# Patient Record
Sex: Male | Born: 1935 | Race: White | Hispanic: No | Marital: Married | Smoking: Former smoker
Health system: Southern US, Community
[De-identification: ages and names within clinical notes are randomized; demographics above are authoritative.]

## PROBLEM LIST (undated history)

## (undated) DIAGNOSIS — I779 Disorder of arteries and arterioles, unspecified: Secondary | ICD-10-CM

## (undated) DIAGNOSIS — R9430 Abnormal result of cardiovascular function study, unspecified: Secondary | ICD-10-CM

## (undated) DIAGNOSIS — E785 Hyperlipidemia, unspecified: Secondary | ICD-10-CM

## (undated) DIAGNOSIS — I2510 Atherosclerotic heart disease of native coronary artery without angina pectoris: Secondary | ICD-10-CM

## (undated) DIAGNOSIS — N200 Calculus of kidney: Secondary | ICD-10-CM

## (undated) DIAGNOSIS — IMO0002 Reserved for concepts with insufficient information to code with codable children: Secondary | ICD-10-CM

## (undated) DIAGNOSIS — K589 Irritable bowel syndrome without diarrhea: Secondary | ICD-10-CM

## (undated) DIAGNOSIS — R252 Cramp and spasm: Secondary | ICD-10-CM

## (undated) DIAGNOSIS — I472 Ventricular tachycardia: Secondary | ICD-10-CM

## (undated) DIAGNOSIS — R42 Dizziness and giddiness: Secondary | ICD-10-CM

## (undated) DIAGNOSIS — K7689 Other specified diseases of liver: Secondary | ICD-10-CM

## (undated) DIAGNOSIS — R059 Cough, unspecified: Secondary | ICD-10-CM

## (undated) DIAGNOSIS — Z951 Presence of aortocoronary bypass graft: Secondary | ICD-10-CM

## (undated) DIAGNOSIS — R000 Tachycardia, unspecified: Secondary | ICD-10-CM

## (undated) DIAGNOSIS — R040 Epistaxis: Secondary | ICD-10-CM

## (undated) DIAGNOSIS — J449 Chronic obstructive pulmonary disease, unspecified: Secondary | ICD-10-CM

## (undated) DIAGNOSIS — K219 Gastro-esophageal reflux disease without esophagitis: Secondary | ICD-10-CM

## (undated) DIAGNOSIS — I519 Heart disease, unspecified: Secondary | ICD-10-CM

## (undated) DIAGNOSIS — F329 Major depressive disorder, single episode, unspecified: Secondary | ICD-10-CM

## (undated) DIAGNOSIS — N400 Enlarged prostate without lower urinary tract symptoms: Secondary | ICD-10-CM

## (undated) DIAGNOSIS — I739 Peripheral vascular disease, unspecified: Secondary | ICD-10-CM

## (undated) DIAGNOSIS — I447 Left bundle-branch block, unspecified: Secondary | ICD-10-CM

## (undated) DIAGNOSIS — Z789 Other specified health status: Secondary | ICD-10-CM

## (undated) DIAGNOSIS — R0981 Nasal congestion: Secondary | ICD-10-CM

## (undated) DIAGNOSIS — F32A Depression, unspecified: Secondary | ICD-10-CM

## (undated) DIAGNOSIS — K5790 Diverticulosis of intestine, part unspecified, without perforation or abscess without bleeding: Secondary | ICD-10-CM

## (undated) DIAGNOSIS — Z953 Presence of xenogenic heart valve: Secondary | ICD-10-CM

## (undated) DIAGNOSIS — I255 Ischemic cardiomyopathy: Secondary | ICD-10-CM

## (undated) DIAGNOSIS — R05 Cough: Secondary | ICD-10-CM

## (undated) HISTORY — DX: Irritable bowel syndrome, unspecified: K58.9

## (undated) HISTORY — DX: Major depressive disorder, single episode, unspecified: F32.9

## (undated) HISTORY — DX: Atherosclerotic heart disease of native coronary artery without angina pectoris: I25.10

## (undated) HISTORY — DX: Cough, unspecified: R05.9

## (undated) HISTORY — PX: CORONARY ARTERY BYPASS GRAFT: SHX141

## (undated) HISTORY — DX: Dizziness and giddiness: R42

## (undated) HISTORY — DX: Other specified health status: Z78.9

## (undated) HISTORY — DX: Chronic obstructive pulmonary disease, unspecified: J44.9

## (undated) HISTORY — DX: Benign prostatic hyperplasia without lower urinary tract symptoms: N40.0

## (undated) HISTORY — DX: Depression, unspecified: F32.A

## (undated) HISTORY — DX: Ventricular tachycardia: I47.2

## (undated) HISTORY — PX: CHOLECYSTECTOMY: SHX55

## (undated) HISTORY — DX: Tachycardia, unspecified: R00.0

## (undated) HISTORY — PX: LITHOTRIPSY: SUR834

## (undated) HISTORY — DX: Gastro-esophageal reflux disease without esophagitis: K21.9

## (undated) HISTORY — PX: INSERT / REPLACE / REMOVE PACEMAKER: SUR710

## (undated) HISTORY — DX: Presence of aortocoronary bypass graft: Z95.1

## (undated) HISTORY — DX: Reserved for concepts with insufficient information to code with codable children: IMO0002

## (undated) HISTORY — DX: Other specified diseases of liver: K76.89

## (undated) HISTORY — DX: Calculus of kidney: N20.0

## (undated) HISTORY — DX: Left bundle-branch block, unspecified: I44.7

## (undated) HISTORY — DX: Cramp and spasm: R25.2

## (undated) HISTORY — DX: Abnormal result of cardiovascular function study, unspecified: R94.30

## (undated) HISTORY — DX: Presence of xenogenic heart valve: Z95.3

## (undated) HISTORY — DX: Ischemic cardiomyopathy: I25.5

## (undated) HISTORY — DX: Peripheral vascular disease, unspecified: I73.9

## (undated) HISTORY — DX: Diverticulosis of intestine, part unspecified, without perforation or abscess without bleeding: K57.90

## (undated) HISTORY — DX: Epistaxis: R04.0

## (undated) HISTORY — DX: Hyperlipidemia, unspecified: E78.5

## (undated) HISTORY — DX: Heart disease, unspecified: I51.9

## (undated) HISTORY — PX: OTHER SURGICAL HISTORY: SHX169

## (undated) HISTORY — DX: Cough: R05

## (undated) HISTORY — DX: Nasal congestion: R09.81

## (undated) HISTORY — DX: Disorder of arteries and arterioles, unspecified: I77.9

---

## 1998-10-01 ENCOUNTER — Encounter (INDEPENDENT_AMBULATORY_CARE_PROVIDER_SITE_OTHER): Payer: Self-pay | Admitting: Specialist

## 1998-10-01 ENCOUNTER — Other Ambulatory Visit: Admission: RE | Admit: 1998-10-01 | Discharge: 1998-10-01 | Payer: Self-pay | Admitting: Gastroenterology

## 1999-01-14 ENCOUNTER — Encounter: Payer: Self-pay | Admitting: Gastroenterology

## 1999-01-14 ENCOUNTER — Ambulatory Visit (HOSPITAL_COMMUNITY): Admission: RE | Admit: 1999-01-14 | Discharge: 1999-01-14 | Payer: Self-pay | Admitting: Gastroenterology

## 1999-04-30 ENCOUNTER — Encounter: Admission: RE | Admit: 1999-04-30 | Discharge: 1999-04-30 | Payer: Self-pay | Admitting: *Deleted

## 1999-04-30 ENCOUNTER — Encounter: Payer: Self-pay | Admitting: *Deleted

## 1999-05-21 ENCOUNTER — Encounter: Payer: Self-pay | Admitting: *Deleted

## 1999-05-21 ENCOUNTER — Encounter: Admission: RE | Admit: 1999-05-21 | Discharge: 1999-05-21 | Payer: Self-pay | Admitting: Pediatrics

## 1999-05-27 ENCOUNTER — Ambulatory Visit (HOSPITAL_COMMUNITY): Admission: RE | Admit: 1999-05-27 | Discharge: 1999-05-27 | Payer: Self-pay | Admitting: Gastroenterology

## 1999-05-27 ENCOUNTER — Encounter: Payer: Self-pay | Admitting: Gastroenterology

## 1999-05-29 ENCOUNTER — Encounter: Admission: RE | Admit: 1999-05-29 | Discharge: 1999-05-29 | Payer: Self-pay | Admitting: *Deleted

## 1999-06-04 ENCOUNTER — Encounter: Payer: Self-pay | Admitting: *Deleted

## 1999-06-04 ENCOUNTER — Encounter: Admission: RE | Admit: 1999-06-04 | Discharge: 1999-06-04 | Payer: Self-pay | Admitting: *Deleted

## 2003-12-16 ENCOUNTER — Ambulatory Visit: Payer: Self-pay | Admitting: Cardiology

## 2003-12-25 ENCOUNTER — Ambulatory Visit: Payer: Self-pay

## 2004-01-13 ENCOUNTER — Ambulatory Visit: Payer: Self-pay | Admitting: Cardiology

## 2004-02-12 ENCOUNTER — Ambulatory Visit: Payer: Self-pay | Admitting: Cardiology

## 2004-09-01 ENCOUNTER — Ambulatory Visit: Payer: Self-pay | Admitting: Cardiology

## 2004-12-03 ENCOUNTER — Ambulatory Visit: Payer: Self-pay | Admitting: Gastroenterology

## 2004-12-29 ENCOUNTER — Ambulatory Visit: Payer: Self-pay | Admitting: Cardiology

## 2005-01-12 ENCOUNTER — Ambulatory Visit: Payer: Self-pay

## 2005-01-12 ENCOUNTER — Encounter: Payer: Self-pay | Admitting: Cardiology

## 2005-01-12 ENCOUNTER — Encounter: Payer: Self-pay | Admitting: Cardiovascular Disease

## 2005-01-25 ENCOUNTER — Ambulatory Visit: Payer: Self-pay | Admitting: Cardiology

## 2005-01-28 ENCOUNTER — Ambulatory Visit: Payer: Self-pay | Admitting: Cardiology

## 2005-01-29 ENCOUNTER — Ambulatory Visit: Payer: Self-pay | Admitting: Cardiology

## 2005-02-03 ENCOUNTER — Inpatient Hospital Stay (HOSPITAL_BASED_OUTPATIENT_CLINIC_OR_DEPARTMENT_OTHER): Admission: RE | Admit: 2005-02-03 | Discharge: 2005-02-03 | Payer: Self-pay | Admitting: Cardiology

## 2005-02-03 ENCOUNTER — Ambulatory Visit: Payer: Self-pay | Admitting: Cardiology

## 2005-02-08 DIAGNOSIS — Z951 Presence of aortocoronary bypass graft: Secondary | ICD-10-CM

## 2005-02-08 HISTORY — PX: AORTIC VALVE REPLACEMENT: SHX41

## 2005-02-08 HISTORY — DX: Presence of aortocoronary bypass graft: Z95.1

## 2005-02-09 ENCOUNTER — Ambulatory Visit: Payer: Self-pay

## 2005-02-13 ENCOUNTER — Ambulatory Visit: Payer: Self-pay | Admitting: Cardiology

## 2005-03-02 ENCOUNTER — Ambulatory Visit: Payer: Self-pay | Admitting: Internal Medicine

## 2005-03-02 ENCOUNTER — Encounter (INDEPENDENT_AMBULATORY_CARE_PROVIDER_SITE_OTHER): Payer: Self-pay | Admitting: Specialist

## 2005-03-02 ENCOUNTER — Inpatient Hospital Stay (HOSPITAL_COMMUNITY)
Admission: RE | Admit: 2005-03-02 | Discharge: 2005-03-12 | Payer: Self-pay | Admitting: Thoracic Surgery (Cardiothoracic Vascular Surgery)

## 2005-03-02 ENCOUNTER — Encounter: Payer: Self-pay | Admitting: Cardiology

## 2005-03-08 ENCOUNTER — Encounter: Payer: Self-pay | Admitting: Cardiology

## 2005-03-16 ENCOUNTER — Ambulatory Visit: Payer: Self-pay | Admitting: Cardiology

## 2005-05-10 ENCOUNTER — Ambulatory Visit: Payer: Self-pay | Admitting: Cardiology

## 2005-05-21 ENCOUNTER — Ambulatory Visit: Payer: Self-pay | Admitting: Cardiology

## 2005-06-22 ENCOUNTER — Ambulatory Visit: Payer: Self-pay | Admitting: Internal Medicine

## 2005-09-09 ENCOUNTER — Ambulatory Visit: Payer: Self-pay | Admitting: Cardiology

## 2005-12-02 ENCOUNTER — Ambulatory Visit: Payer: Self-pay | Admitting: Gastroenterology

## 2005-12-21 ENCOUNTER — Ambulatory Visit: Payer: Self-pay | Admitting: Cardiology

## 2005-12-21 LAB — CONVERTED CEMR LAB
Chol/HDL Ratio, serum: 3.9
Cholesterol: 124 mg/dL
HDL: 31.4 mg/dL — ABNORMAL LOW
LDL Cholesterol: 82 mg/dL
Triglyceride fasting, serum: 51 mg/dL
VLDL: 10 mg/dL

## 2006-01-12 ENCOUNTER — Ambulatory Visit: Payer: Self-pay

## 2006-02-21 ENCOUNTER — Ambulatory Visit: Payer: Self-pay

## 2006-03-28 ENCOUNTER — Ambulatory Visit: Payer: Self-pay | Admitting: Internal Medicine

## 2006-05-23 ENCOUNTER — Ambulatory Visit: Payer: Self-pay | Admitting: Internal Medicine

## 2006-06-20 ENCOUNTER — Ambulatory Visit: Payer: Self-pay | Admitting: Cardiology

## 2006-06-20 LAB — CONVERTED CEMR LAB
Albumin: 3.7 g/dL (ref 3.5–5.2)
Bilirubin, Direct: 0.1 mg/dL (ref 0.0–0.3)
Cholesterol: 128 mg/dL (ref 0–200)
HDL: 28.3 mg/dL — ABNORMAL LOW (ref >39.0)
Total Bilirubin: 0.7 mg/dL (ref 0.3–1.2)
Total CHOL/HDL Ratio: 4.5
Total Protein: 6.6 g/dL (ref 6.0–8.3)
Triglycerides: 72 mg/dL (ref 0–149)

## 2006-07-18 ENCOUNTER — Ambulatory Visit: Payer: Self-pay | Admitting: Internal Medicine

## 2006-08-08 ENCOUNTER — Ambulatory Visit: Payer: Self-pay | Admitting: Cardiology

## 2006-08-08 LAB — CONVERTED CEMR LAB
ALT: 19 units/L (ref 0–53)
AST: 23 units/L (ref 0–37)
Cholesterol: 140 mg/dL (ref 0–200)
HDL: 32.9 mg/dL — ABNORMAL LOW (ref >39.0)
Total Bilirubin: 0.8 mg/dL (ref 0.3–1.2)
Total Protein: 6.8 g/dL (ref 6.0–8.3)

## 2006-09-12 ENCOUNTER — Ambulatory Visit: Payer: Self-pay | Admitting: Internal Medicine

## 2006-11-07 ENCOUNTER — Ambulatory Visit: Payer: Self-pay | Admitting: Internal Medicine

## 2007-01-02 ENCOUNTER — Ambulatory Visit: Payer: Self-pay | Admitting: Internal Medicine

## 2007-01-02 ENCOUNTER — Ambulatory Visit: Payer: Self-pay | Admitting: Cardiology

## 2007-01-02 LAB — CONVERTED CEMR LAB
AST: 23 units/L (ref 0–37)
Cholesterol: 119 mg/dL (ref 0–200)
HDL: 29.3 mg/dL — ABNORMAL LOW (ref >39.0)
Total Bilirubin: 0.6 mg/dL (ref 0.3–1.2)
Total CHOL/HDL Ratio: 4.1
Total Protein: 6.7 g/dL (ref 6.0–8.3)

## 2007-01-18 ENCOUNTER — Ambulatory Visit: Payer: Self-pay | Admitting: Cardiology

## 2007-01-27 ENCOUNTER — Ambulatory Visit: Payer: Self-pay | Admitting: Cardiology

## 2007-02-27 ENCOUNTER — Ambulatory Visit: Payer: Self-pay | Admitting: Internal Medicine

## 2007-03-23 ENCOUNTER — Ambulatory Visit: Payer: Self-pay

## 2007-03-30 ENCOUNTER — Ambulatory Visit: Payer: Self-pay | Admitting: Gastroenterology

## 2007-05-29 ENCOUNTER — Ambulatory Visit: Payer: Self-pay | Admitting: Internal Medicine

## 2007-08-28 ENCOUNTER — Ambulatory Visit: Payer: Self-pay | Admitting: Internal Medicine

## 2007-10-03 ENCOUNTER — Ambulatory Visit: Payer: Self-pay | Admitting: Internal Medicine

## 2007-11-27 ENCOUNTER — Ambulatory Visit: Payer: Self-pay | Admitting: Internal Medicine

## 2008-01-12 ENCOUNTER — Ambulatory Visit: Payer: Self-pay | Admitting: Cardiology

## 2008-01-12 ENCOUNTER — Ambulatory Visit: Payer: Self-pay

## 2008-03-18 ENCOUNTER — Telehealth: Payer: Self-pay | Admitting: Gastroenterology

## 2008-03-19 ENCOUNTER — Encounter: Payer: Self-pay | Admitting: Gastroenterology

## 2008-04-19 ENCOUNTER — Encounter: Payer: Self-pay | Admitting: Internal Medicine

## 2008-04-25 ENCOUNTER — Ambulatory Visit: Payer: Self-pay

## 2008-07-09 ENCOUNTER — Ambulatory Visit: Payer: Self-pay | Admitting: Internal Medicine

## 2008-08-26 ENCOUNTER — Telehealth: Payer: Self-pay | Admitting: Gastroenterology

## 2008-08-26 ENCOUNTER — Telehealth: Payer: Self-pay | Admitting: Cardiology

## 2008-09-16 ENCOUNTER — Encounter: Payer: Self-pay | Admitting: Gastroenterology

## 2008-10-31 ENCOUNTER — Ambulatory Visit: Payer: Self-pay | Admitting: Internal Medicine

## 2008-11-01 DIAGNOSIS — I10 Essential (primary) hypertension: Secondary | ICD-10-CM

## 2008-11-27 ENCOUNTER — Encounter (INDEPENDENT_AMBULATORY_CARE_PROVIDER_SITE_OTHER): Payer: Self-pay | Admitting: *Deleted

## 2009-01-14 ENCOUNTER — Encounter: Payer: Self-pay | Admitting: Cardiology

## 2009-01-15 ENCOUNTER — Encounter: Payer: Self-pay | Admitting: Cardiology

## 2009-01-15 ENCOUNTER — Ambulatory Visit: Payer: Self-pay

## 2009-01-19 ENCOUNTER — Encounter: Payer: Self-pay | Admitting: Cardiology

## 2009-01-19 DIAGNOSIS — Z951 Presence of aortocoronary bypass graft: Secondary | ICD-10-CM | POA: Insufficient documentation

## 2009-01-19 DIAGNOSIS — Z954 Presence of other heart-valve replacement: Secondary | ICD-10-CM

## 2009-01-19 DIAGNOSIS — Z950 Presence of cardiac pacemaker: Secondary | ICD-10-CM | POA: Insufficient documentation

## 2009-01-19 DIAGNOSIS — J449 Chronic obstructive pulmonary disease, unspecified: Secondary | ICD-10-CM

## 2009-01-19 DIAGNOSIS — K219 Gastro-esophageal reflux disease without esophagitis: Secondary | ICD-10-CM

## 2009-01-19 DIAGNOSIS — J4489 Other specified chronic obstructive pulmonary disease: Secondary | ICD-10-CM | POA: Insufficient documentation

## 2009-02-10 ENCOUNTER — Ambulatory Visit: Payer: Self-pay | Admitting: Internal Medicine

## 2009-02-14 ENCOUNTER — Telehealth: Payer: Self-pay | Admitting: Cardiology

## 2009-03-06 ENCOUNTER — Telehealth: Payer: Self-pay | Admitting: Gastroenterology

## 2009-04-30 ENCOUNTER — Encounter: Payer: Self-pay | Admitting: Cardiology

## 2009-05-02 ENCOUNTER — Ambulatory Visit: Payer: Self-pay | Admitting: Cardiology

## 2009-05-22 ENCOUNTER — Ambulatory Visit: Payer: Self-pay | Admitting: Cardiology

## 2009-05-22 ENCOUNTER — Ambulatory Visit: Payer: Self-pay

## 2009-05-22 ENCOUNTER — Ambulatory Visit (HOSPITAL_COMMUNITY): Admission: RE | Admit: 2009-05-22 | Discharge: 2009-05-22 | Payer: Self-pay | Admitting: Cardiology

## 2009-05-22 ENCOUNTER — Encounter: Payer: Self-pay | Admitting: Cardiology

## 2009-06-02 ENCOUNTER — Telehealth: Payer: Self-pay | Admitting: Gastroenterology

## 2009-06-05 ENCOUNTER — Ambulatory Visit: Payer: Self-pay | Admitting: Gastroenterology

## 2009-06-05 DIAGNOSIS — K5730 Diverticulosis of large intestine without perforation or abscess without bleeding: Secondary | ICD-10-CM | POA: Insufficient documentation

## 2009-06-05 LAB — CONVERTED CEMR LAB
Folate: >20 ng/mL
Iron: 47 ug/dL (ref 42–165)
Vitamin B-12: 441 pg/mL (ref 211–911)

## 2009-06-11 ENCOUNTER — Telehealth (INDEPENDENT_AMBULATORY_CARE_PROVIDER_SITE_OTHER): Payer: Self-pay | Admitting: *Deleted

## 2009-08-08 ENCOUNTER — Ambulatory Visit: Payer: Self-pay | Admitting: Internal Medicine

## 2009-08-12 ENCOUNTER — Telehealth: Payer: Self-pay | Admitting: Cardiology

## 2009-10-17 ENCOUNTER — Encounter: Payer: Self-pay | Admitting: Cardiology

## 2009-10-20 ENCOUNTER — Ambulatory Visit: Payer: Self-pay | Admitting: Cardiology

## 2009-10-21 ENCOUNTER — Telehealth: Payer: Self-pay | Admitting: Cardiology

## 2009-11-04 ENCOUNTER — Encounter: Payer: Self-pay | Admitting: Cardiology

## 2009-11-07 ENCOUNTER — Ambulatory Visit: Payer: Self-pay | Admitting: Internal Medicine

## 2009-11-13 ENCOUNTER — Ambulatory Visit: Payer: Self-pay | Admitting: Cardiology

## 2009-11-17 ENCOUNTER — Emergency Department (HOSPITAL_COMMUNITY): Admission: EM | Admit: 2009-11-17 | Discharge: 2009-11-17 | Payer: Self-pay | Admitting: Emergency Medicine

## 2009-12-04 ENCOUNTER — Ambulatory Visit: Payer: Self-pay | Admitting: Cardiology

## 2009-12-04 ENCOUNTER — Encounter: Payer: Self-pay | Admitting: Internal Medicine

## 2009-12-04 ENCOUNTER — Encounter: Payer: Self-pay | Admitting: Cardiology

## 2009-12-04 DIAGNOSIS — R58 Hemorrhage, not elsewhere classified: Secondary | ICD-10-CM | POA: Insufficient documentation

## 2009-12-05 LAB — CONVERTED CEMR LAB
Basophils Relative: 0.7 % (ref 0.0–3.0)
Eosinophils Relative: 1.4 % (ref 0.0–5.0)
HCT: 31.7 % — ABNORMAL LOW (ref 39.0–52.0)
Hemoglobin: 10.7 g/dL — ABNORMAL LOW (ref 13.0–17.0)
Lymphocytes Relative: 19.9 % (ref 12.0–46.0)
MCV: 88.7 fL (ref 78.0–100.0)
Monocytes Absolute: 0.8 10*3/uL (ref 0.1–1.0)
Neutrophils Relative %: 69.5 % (ref 43.0–77.0)
Platelets: 247 10*3/uL (ref 150.0–400.0)
RDW: 13 % (ref 11.5–14.6)

## 2010-01-15 ENCOUNTER — Ambulatory Visit: Payer: Self-pay | Admitting: Cardiology

## 2010-01-19 ENCOUNTER — Encounter: Payer: Self-pay | Admitting: Cardiology

## 2010-01-19 ENCOUNTER — Ambulatory Visit: Payer: Self-pay

## 2010-01-26 ENCOUNTER — Telehealth: Payer: Self-pay | Admitting: Cardiology

## 2010-02-19 ENCOUNTER — Encounter: Payer: Self-pay | Admitting: Internal Medicine

## 2010-02-24 ENCOUNTER — Ambulatory Visit
Admission: RE | Admit: 2010-02-24 | Discharge: 2010-02-24 | Payer: Self-pay | Source: Home / Self Care | Attending: Cardiology | Admitting: Cardiology

## 2010-03-03 ENCOUNTER — Ambulatory Visit: Payer: Self-pay | Admitting: Internal Medicine

## 2010-03-12 NOTE — Miscellaneous (Signed)
  Clinical Lists Changes  Observations: Added new observation of PAST MED HX:  Hyperlipidemia, on medication. NIASPAN intolerant Cholecystectomy by history. 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  /   good valve function.... mild AI.... echo... April, 2011 CABG....Marland KitchenZOX,0960 CAD EF  45% range  /   EF 25-30%.... echo.... April, 2011..... akinesis inferior posterior walls depression  Chronic obstructive pulmonary disease. renal stones. Prostate difficulties. Carotid artery disease....doppler  01/15/2009..stable...40-59% RICA, 0-39% LICA  dizziness historically. Gastroesophageal reflux disease. diverticulosis and irritable bowel. leg cramping that is not vascular in origin and is stable. liver cyst. permanent pacemaker.  wide complex tachycardia...post op...on a beta blockerand stable.  abdominal ultrasound... 2007.... no significant abdominal aortic aneurysm (10/17/2009 15:35) Added new observation of REFERRING MD: n/a (10/17/2009 15:35) Added new observation of PRIMARY MD: Lanell Persons, MD (10/17/2009 15:35)       Past History:  Past Medical History:  Hyperlipidemia, on medication. NIASPAN intolerant Cholecystectomy by history. 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  /   good valve function.... mild AI.... echo... April, 2011 CABG....Marland KitchenAVW,0981 CAD EF  45% range  /   EF 25-30%.... echo.... April, 2011..... akinesis inferior posterior walls depression  Chronic obstructive pulmonary disease. renal stones. Prostate difficulties. Carotid artery disease....doppler  01/15/2009..stable...40-59% RICA, 0-39% LICA  dizziness historically. Gastroesophageal reflux disease. diverticulosis and irritable bowel. leg cramping that is not vascular in origin and is stable. liver cyst. permanent pacemaker.  wide complex tachycardia...post op...on a beta blockerand stable.  abdominal ultrasound... 2007.... no significant  abdominal aortic aneurysm

## 2010-03-12 NOTE — Progress Notes (Signed)
Summary: refill request  Phone Note Refill Request Message from:  [facility]  Refills Requested: Medication #1:  RAMIPRIL 10 MG CAPS Take one capsule by mouth daily. needs refill today-already out   Method Requested: Telephone to Pharmacy Initial call taken by: Glynda Jaeger,  January 26, 2010 8:55 AM    Prescriptions: RAMIPRIL 10 MG CAPS (RAMIPRIL) Take one capsule by mouth daily  #30 x 10   Entered by:   Hardin Negus, RMA   Authorized by:   Talitha Givens, MD, [facility]   Signed by:   Hardin Negus, RMA on 01/27/2010   Method used:   [facility]        [facility] [phone][facility] (retail)       [address]       [place], [place]  [address]       Ph: [phone]       Fax: [phone]   RxID:   [id number]

## 2010-03-12 NOTE — Progress Notes (Signed)
Summary: refill**Mail to pt, Please call pt once sent**  Phone Note Refill Request Call back at Home Phone [phone] Message from:  Patient on August 12, 2009 4:59 PM  Refills Requested: Medication #1:  SIMVASTATIN 20 MG TABS Take one tablet by mouth daily at bedtime Written Rx mail to pt   Method Requested: Mail to Patient Initial call taken by: Migdalia Dk,  August 12, 2009 4:59 PM  Follow-up for Phone Call        spoke with pt -- ok to send to [facility] Follow-up by: Hardin Negus, RMA,  August 13, 2009 3:55 PM    Prescriptions: SIMVASTATIN 20 MG TABS (SIMVASTATIN) Take one tablet by mouth daily at bedtime  #90 x 3   Entered by:   Hardin Negus, RMA   Authorized by:   Talitha Givens, MD, [facility]   Signed by:   Hardin Negus, RMA on 08/13/2009   Method used:   Faxed to ...       [facility] (mail-order)             , [place]         Ph: [phone]       Fax: [phone]   RxID:   [id number]

## 2010-03-12 NOTE — Assessment & Plan Note (Signed)
Summary: per check out/sf   Visit Type:  Follow-up Primary Asberry [place]:  Lanell Persons, MD  CC:  ischemic cardiomyopathy.  History of Present Illness: The patient is seen for followup of ischemic cardiomyopathy.  He is doing well.  I've been titrating his meds.  His heart rate remained 60 and his carvedilol dose will be kept the same.  He is tolerating Altace and the dose can be increased from 5-10.  Current Medications (verified): 1)  Aciphex 20 Mg  Tbec (Rabeprazole Sodium) .... Take 1 Each Day 30 Minutes Before Meals 2)  Simvastatin 20 Mg Tabs (Simvastatin) .... Take One Tablet By Mouth Daily At Bedtime 3)  Carvedilol 6.25 Mg Tabs (Carvedilol) .... Take One Tablet By Mouth Twice A Day 4)  Furosemide 40 Mg Tabs (Furosemide) .... Take One Tablet By Mouth Daily. 5)  Aspirin 81 Mg Tbec (Aspirin) .... Take One Tablet By Mouth Daily 6)  Finasteride 5 Mg Tabs (Finasteride) .... Take 1 Tablet By Mouth Once A Day 7)  Doxazosin Mesylate 4 Mg Tabs (Doxazosin Mesylate) .... Take 1 Tablet By Mouth Once A Day 8)  Potassium Chloride Crys Cr 20 Meq Cr-Tabs (Potassium Chloride Crys Cr) .... Take One Tablet By Mouth Daily 9)  Benefiber  Powd (Wheat Dextrin) .... Uses Daily 10)  Dulcolax Stool Softener 100 Mg Caps (Docusate Sodium) .... Uses Two Times A Day 11)  Multivitamins   Tabs (Multiple Vitamin) .... Once Daily 12)  Zinc 50 Mg Tabs (Zinc) .... Once Daily 13)  Altace 5 Mg Caps (Ramipril) .... Take One Tablet By Mouth Daily  Allergies: 1)  ! Sulfa  Past History:  Past Medical History:  Hyperlipidemia, on medication. NIASPAN intolerant Cholecystectomy by history. 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  /   good valve function.... mild AI.... echo... April, 2011 CABG....Marland KitchenZOX,0960 CAD EF  45% range  /   EF 25-30%.... echo.... April, 2011..... akinesis inferior posterior walls depression  Chronic obstructive pulmonary disease. renal stones. Prostate  difficulties... Carotid artery disease....doppler  01/15/2009..stable...40-59% RICA, 0-39% LICA  dizziness historically. Gastroesophageal reflux disease. diverticulosis and irritable bowel. leg cramping that is not vascular in origin and is stable. liver cyst. permanent pacemaker.  wide complex tachycardia...post op...on a beta blockerand stable.  abdominal ultrasound... 2007.... no significant abdominal aortic aneurysm Sinus congestion    September, 2011 Nosebleeds   September, 2011...cauterized  Dr.wolicki... September, 2011 /  cauterization repeated October, 2011  Review of Systems       Patient denies fever, chills, headache, sweats, rash, change in vision, change in hearing, chest pain, cough, nausea vomiting, urinary symptoms.  All of the systems are reviewed and are negative.  Vital Signs:  Patient profile:   77 year old male Height:      68 inches Weight:      153 pounds Pulse rate:   60 / minute Pulse rhythm:   regular BP sitting:   132 / 72  (left arm)  Physical Exam  General:  patient is stable. Eyes:  no xanthelasma. Neck:  njugular venous distention. Lungs:  lungs are clear.  Respiratory effort is nonlabored. Heart:  cardiac exam reveals S1 and S2.  There is a systolic murmur. Abdomen:  abdomen is soft. Extremities:  no peripheral edema. Psych:  patient is oriented to person time and place.  Affect is normal.   PPM Specifications Following MD:  Lewayne Bunting, MD     PPM Vendor:  [employer]     PPM Model Number:  [phone]  PPM Serial Number:  [id number] DOI:  03/08/2005      Lead 1    Location: RA     DOI: 03/08/2005     Model #: 1788TC     Serial #: [id number]     Status: active Lead 2    Location: RV     DOI: 03/08/2005     Model #: 1788TC     Serial #: [id number]     Status: active  Magnet Response Rate:  BOL 98.6 ERI 86.3  Indications:  CHB  Explantation Comments:  Pacemaker dependent TTM's with [employer]  PPM Follow Up Pacer Dependent:  Yes       Episodes Coumadin:  No  Parameters Mode:  DDDR     Lower Rate Limit:  60     Upper Rate Limit:  120 Paced AV Delay:  180     Sensed AV Delay:  180 Rate Response Parameters:  Threshold-Auto (+0), Slope-8, Reaction time-fast, Recovery time-medium  Impression & Recommendations:  Problem # 1:  * EF 30%... APRIL, 2011 Adjustment to his meds will be to increase Altase from 5-10 mg.  Problem # 2:  CAROTID ARTERY DISEASE (ICD-433.10)  His updated medication list for this problem includes:    Aspirin 81 Mg Tbec (Aspirin) .[name]... Take one tablet by mouth daily  Orders: Carotid Duplex (Carotid Duplex) We will order a followup carotid Doppler as his is now one year.  His updated medication list for this problem includes:    Aspirin 81 Mg Tbec (Aspirin) .[name]... Take one tablet by mouth daily  Problem # 3:  AORTIC VALVE REPLACEMENT, HX OF (ICD-V43.3) Aortic valve is working well.  No change in therapy.  Plan followup 3-4 weeks  Patient Instructions: 1)  Your physician recommends that you schedule a follow-up appointment in: 3-4 weeks. 2)  Your physician has requested that you have a carotid duplex. This test is an ultrasound of the carotid arteries in your neck. It looks at blood flow through these arteries that supply the brain with blood. Allow one hour for this exam. There are no restrictions or special instructions. 3)  Your physician has recommended you make the following change in your medication: INCREASE RAMIRPIL to 10mg  by mouth daily.

## 2010-03-12 NOTE — Cardiovascular Report (Signed)
Summary: Set designer Check   Imported By: Roderic Ovens 07/09/2009 11:28:29  _____________________________________________________________________  External Attachment:    Type:   Image     Comment:   External Document

## 2010-03-12 NOTE — Cardiovascular Report (Signed)
Summary: TTM   TTM   Imported By: Roderic Ovens 08/29/2009 09:20:29  _____________________________________________________________________  External Attachment:    Type:   Image     Comment:   External Document

## 2010-03-12 NOTE — Progress Notes (Signed)
Summary: question about refills  Phone Note Call from Patient Call back at Home Phone [phone]   Caller: Patient Summary of Call: Pt calling regarding a refill  Initial call taken by: Judie Grieve,  October 21, 2009 4:15 PM  Follow-up for Phone Call        have attempted to call pt x 2 w/busy signal Meredith Staggers, RN  October 21, 2009 5:03 PM Tried calling Pt, let ring more then 10 times with no answer Hardin Negus, RMA  October 22, 2009 10:36 AM   Pt was in the process of switching to [facility] and [facility] and he didn't realize it till after he paid for them, he would like to switch [facility] told him they would contact us for the script. Hardin Negus, RMA  October 22, 2009 4:06 PM

## 2010-03-12 NOTE — Progress Notes (Signed)
Summary: refill meds  Phone Note Refill Request Call back at Home Phone [phone] Message from:  Patient on February 14, 2009 3:54 PM  Refills Requested: Medication #1:  FUROSEMIDE 40 MG TABS Take one tablet by mouth daily.   Supply Requested: 3 months  Medication #2:  ATENOLOL 25 MG TABS Take one tablet by mouth daily   Supply Requested: 3 months [facility]    Method Requested: Mail to Patient Initial call taken by: Lorne Skeens,  February 14, 2009 3:54 PM  Follow-up for Phone Call        refills sent Atenolol 25 mg once daily and Furosemide 40 mg once daily 90 x3 to [facility] Follow-up by: Oswald Hillock,  February 14, 2009 4:08 PM    Prescriptions: FUROSEMIDE 40 MG TABS (FUROSEMIDE) Take one tablet by mouth daily.  #90 x 3   Entered by:   Oswald Hillock   Authorized by:   Talitha Givens, MD, [facility]   Signed by:   Oswald Hillock on 02/14/2009   Method used:   Faxed to ...       [facility]* (mail-order)             ,          Ph: [phone]       Fax: [phone]   RxID:   [id number] ATENOLOL 25 MG TABS (ATENOLOL) Take one tablet by mouth daily  #90 x 3   Entered by:   Oswald Hillock   Authorized by:   Talitha Givens, MD, [facility]   Signed by:   Oswald Hillock on 02/14/2009   Method used:   Faxed to ...       [facility]* (mail-order)             ,          Ph: [phone]       Fax: [phone]   RxID:   [id number]

## 2010-03-12 NOTE — Assessment & Plan Note (Signed)
Summary: ROV PT HAD CAROTID 01-19-10/ WANTED TO WAIT UNTIL JANUARY TO ...   Visit Type:  Follow-up Primary Provider:  Lanell Persons, MD  CC:  cardiomyopathy.  History of Present Illness: The patient is seen for followup of cardiomyopathy.  We have been slowly titrating his meds and he looks quite good.  I saw him last January 15 2010.  ALT ACE dose was increased at that time 10 mg.  He is tolerating this well.  Current Medications (verified): 1)  Aciphex 20 Mg  Tbec (Rabeprazole Sodium) .... Take 1 Each Day 30 Minutes Before Meals 2)  Simvastatin 20 Mg Tabs (Simvastatin) .... Take One Tablet By Mouth Daily At Bedtime 3)  Carvedilol 6.25 Mg Tabs (Carvedilol) .... Take One Tablet By Mouth Twice A Day 4)  Furosemide 40 Mg Tabs (Furosemide) .... Take One Tablet By Mouth Daily. 5)  Aspirin 81 Mg Tbec (Aspirin) .... Take One Tablet By Mouth Daily 6)  Finasteride 5 Mg Tabs (Finasteride) .... Take 1 Tablet By Mouth Once A Day 7)  Doxazosin Mesylate 4 Mg Tabs (Doxazosin Mesylate) .... Take 1 Tablet By Mouth Once A Day 8)  Potassium Chloride Crys Cr 20 Meq Cr-Tabs (Potassium Chloride Crys Cr) .... Take One Tablet By Mouth Daily 9)  Benefiber  Powd (Wheat Dextrin) .... Uses Daily 10)  Dulcolax Stool Softener 100 Mg Caps (Docusate Sodium) .... Uses Two Times A Day 11)  Multivitamins   Tabs (Multiple Vitamin) .... Once Daily 12)  Zinc 50 Mg Tabs (Zinc) .... Once Daily 13)  Ramipril 10 Mg Caps (Ramipril) .... Take One Capsule By Mouth Daily  Allergies (verified): 1)  ! Sulfa  Past History:  Past Medical History:  Hyperlipidemia, on medication. NIASPAN intolerant Cholecystectomy by history. 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  /   good valve function.... mild AI.... echo... April, 2011 CABG....Marland KitchenWUJ,8119 CAD EF  45% range  /   EF 25-30%.... echo.... April, 2011..... akinesis inferior posterior walls depression  Chronic obstructive pulmonary disease. renal  stones. Prostate difficulties... Carotid artery disease....doppler  01/15/2009..stable...40-59% RICA, 0-39% LICA  dizziness historically. Gastroesophageal reflux disease. diverticulosis and irritable bowel. leg cramping that is not vascular in origin and is stable. liver cyst. permanent pacemaker.  wide complex tachycardia...post op...on a beta blockerand stable.  abdominal ultrasound... 2007.... no significant abdominal aortic aneurysm Sinus congestion    September, 2011 Nosebleeds   September, 2011...cauterized  Dr.wolicki... September, 2011 /  cauterization repeated October, 2011.[name]  Review of Systems       Patient denies fever, chills, headache, sweats, rash, change in vision, change in hearing, chest pain, cough, nausea vomiting, urinary symptoms.  All of the systems are reviewed and are negative.  Vital Signs:  Patient profile:   77 year old male Height:      68 inches Weight:      155 pounds BMI:     23.65 Pulse rate:   70 / minute BP sitting:   126 / 44  (left arm) Cuff size:   regular  Vitals Entered By: Hardin Negus, RMA (February 24, 2010 11:09 AM)  Physical Exam  General:  patient looks very good today. Head:  it is atraumatic. Neck:  soft carotid bruit. Lungs:  lungs are clear.  Respiratory effort is unlabored. Heart:  cardiac exam reveals a swollen S2.  There is an outflow murmur from his aortic valve prosthesis. Abdomen:  abdomen is soft. Extremities:  no peripheral edema. Psych:  patient is oriented to person time and place.  Affect is normal.   PPM Specifications Following MD:  Lewayne Bunting, MD     PPM Vendor:  [employer]     PPM Model Number:  [id number]     PPM Serial Number:  [id number] DOI:  03/08/2005      Lead 1    Location: RA     DOI: 03/08/2005     Model #: 1788TC     Serial #: [id number]     Status: active Lead 2    Location: RV     DOI: 03/08/2005     Model #: 1788TC     Serial #: [id number]     Status: active  Magnet Response Rate:  BOL 98.6 ERI  86.3  Indications:  CHB  Explantation Comments:  Pacemaker dependent TTM's with [employer]  PPM Follow Up Pacer Dependent:  Yes      Episodes Coumadin:  No  Parameters Mode:  DDDR     Lower Rate Limit:  60     Upper Rate Limit:  120 Paced AV Delay:  180     Sensed AV Delay:  180 Rate Response Parameters:  Threshold-Auto (+0), Slope-8, Reaction time-fast, Recovery time-medium  Impression & Recommendations:  Problem # 1:  * EF 30%... APRIL, 2011 We will continue to adjust his cardiac meds.  Today his carvedilol will be increased to 6.2 5 in the morning and 12 .5 in the evening for one week followed by 12.5 mg b.i.d.  I will then see him back in 3-4 weeks.  Problem # 2:  CAROTID ARTERY DISEASE (ICD-433.10)  His updated medication list for this problem includes:    Aspirin 81 Mg Tbec (Aspirin) .[name]... Take one tablet by mouth daily Carotid Doppler was done December, 2011.  Disease is stable with 40-59% bilateral disease.  Problem # 3:  CAD (ICD-414.00)  His updated medication list for this problem includes:    Carvedilol 12.5 Mg Tabs (Carvedilol) .[name]... Take one tablet by mouth twice a day    Aspirin 81 Mg Tbec (Aspirin) .[name]... Take one tablet by mouth daily    Ramipril 10 Mg Caps (Ramipril) .[name]... Take one capsule by mouth daily Artery disease is stable.  No change in therapy.  Patient Instructions: 1)  Increase Carvedilol to 12.5mg  two times a day, do this slowly by only increasing night dose for a week then increase both doses to 12.5mg  2)  Follow up in 3-4 weeks Prescriptions: CARVEDILOL 12.5 MG TABS (CARVEDILOL) Take one tablet by mouth twice a day  #60 x 6   Entered by:   Meredith Staggers, RN   Authorized by:   Talitha Givens, MD, [facility]   Signed by:   Meredith Staggers, RN on 02/24/2010   Method used:   [facility]        [facility] [phone][facility] (retail)       [address]       [place], [place]  [address]       Ph: [phone]       Fax:  [phone]   RxID:   [id number]

## 2010-03-12 NOTE — Cardiovascular Report (Signed)
Summary: TTM   TTM   Imported By: Roderic Ovens 02/25/2009 14:35:20  _____________________________________________________________________  External Attachment:    Type:   Image     Comment:   External Document

## 2010-03-12 NOTE — Progress Notes (Signed)
Summary: speak to nurse  Phone Note Call from Patient Call back at Home Phone [phone]   Caller: Patient Call For: Jarold Motto Reason for Call: Talk to Nurse Summary of Call: Patient wouls like to speak directly to nurse. Please xall between 12:00pm-2:00pm. Initial call taken by: Tawni Levy,  June 02, 2009 9:03 AM  Follow-up for Phone Call        Aciphex supply is down to 30 days and will need authorization.Lupita Leash told him to call her because it was too early when she tried before. Would like Lupita Leash to call him when it is done. Follow-up by: Teryl Lucy RN,  June 02, 2009 12:22 PM  Additional Follow-up for Phone Call Additional follow up Details #1::        talked with pt's wife.  Pt has appt with Dr. Jarold Motto 06/05/09.  Will discuss rx at that time. Additional Follow-up by: Ashok Cordia RN,  June 04, 2009 9:51 AM

## 2010-03-12 NOTE — Assessment & Plan Note (Signed)
Summary: ROV   Visit Type:  Follow-up Primary Provider:  Lanell Persons, MD  CC:  CAD / aortic valve replacement.  History of Present Illness: The patient is seen in followup artery disease and aortic valve replacement and pacemaker function.  He is doing well.  He has a history of intermittent dizziness.  He had some mild dizziness lately but nothing that is more marked in the past.  He is not having any chest pain.  He remains active and was working in his garden.  Current Medications (verified): 1)  Aciphex 20 Mg  Tbec (Rabeprazole Sodium) .... Take 1 Each Day 30 Minutes Before Meals 2)  Simvastatin 20 Mg Tabs (Simvastatin) .... Take One Tablet By Mouth Daily At Bedtime 3)  Atenolol 25 Mg Tabs (Atenolol) .... Take One Tablet By Mouth Daily 4)  Furosemide 40 Mg Tabs (Furosemide) .... Take One Tablet By Mouth Daily. 5)  Aspirin 81 Mg Tbec (Aspirin) .... Take One Tablet By Mouth Daily 6)  Finasteride 5 Mg Tabs (Finasteride) .... Take 1 Tablet By Mouth Once A Day 7)  Doxazosin Mesylate 4 Mg Tabs (Doxazosin Mesylate) .... Take 1 Tablet By Mouth Once A Day 8)  Potassium Chloride Crys Cr 20 Meq Cr-Tabs (Potassium Chloride Crys Cr) .... Take One Tablet By Mouth Daily 9)  Doxycycline Hyclate 100 Mg Caps (Doxycycline Hyclate) .... Once Daily  Allergies (verified): 1)  ! Sulfa  Past History:  Past Medical History: Last updated: 04/30/2009  Hyperlipidemia, on medication. NIASPAN intolerant Cholecystectomy by history. LBBB old aortic valve replacement...Marland KitchenMarland KitchenMarland Kitchen tissue valve........January,2007. CABG....Marland KitchenZOX,0960 CAD EF  45% range depression  Chronic obstructive pulmonary disease. renal stones. Prostate difficulties. Carotid artery disease....doppler  01/15/2009..stable...40-59% RICA, 0-39% LICA  dizziness historically. Gastroesophageal reflux disease. diverticulosis and irritable bowel. leg cramping that is not vascular in origin and is stable. liver cyst. permanent pacemaker.    wide complex tachycardia...post op...on a beta blockerand stable.   Review of Systems       Patient denies fever, chills, headache, rash, sweats, change in vision, change in hearing, chest pain, cough, nausea or vomiting, urinary symptoms.  All other systems are reviewed and are negative.  Vital Signs:  Patient profile:   77 year old male Height:      68 inches Weight:      154 pounds Pulse rate:   60 / minute BP sitting:   128 / 60  (left arm) Cuff size:   regular  Vitals Entered By: Hardin Negus, RMA (May 02, 2009 10:08 AM)  Physical Exam  General:  Bill Cunningham looks great. Head:  head is atraumatic. Eyes:  no xanthelasma. Neck:  no jugular venous distention. Chest Wall:  no chest wall tenderness. Lungs:  lungs are clear.  Respiratory effort is nonlabored. Heart:  cardiac exam reveals S1 and S2.  There is a systolic murmur. Abdomen:  abdomen is soft. Msk:  patient has kyphosis of the spine. Extremities:  no peripheral edema. Skin:  no skin rashes. Psych:  patient is oriented to person time and place.  Affect is normal.   PPM Specifications Following MD:  Lewayne Bunting, MD     PPM Vendor:  [employer]     PPM Model Number:  [id number]     PPM Serial Number:  [id number] DOI:  03/08/2005      Lead 1    Location: RA     DOI: 03/08/2005     Model #: 1788TC     Serial #: [id number]  Status: active Lead 2    Location: RV     DOI: 03/08/2005     Model #: 1788TC     Serial #: [id number]     Status: active  Magnet Response Rate:  BOL 98.6 ERI 86.3  Indications:  CHB  Explantation Comments:  Pacemaker dependent TTM's with [employer]  PPM Follow Up Pacer Dependent:  Yes      Parameters Mode:  DDDR     Lower Rate Limit:  60     Upper Rate Limit:  120 Paced AV Delay:  180     Sensed AV Delay:  180 Rate Response Parameters:  Threshold-Auto (+0), Slope-8, Reaction time-fast, Recovery time-medium  Impression & Recommendations:  Problem # 1:  PACEMAKER, PERMANENT (ICD-V45.01) Pacemaker  is fully checked today.  I have reviewed the data with the pacemaker representative.  There was a slight adjustment to his atrial sensing.  Overall there is good function.  Problem # 2:  CAROTID ARTERY DISEASE (ICD-433.10)  His updated medication list for this problem includes:    Aspirin 81 Mg Tbec (Aspirin) .[name]... Take one tablet by mouth daily The patient had followup carotid Dopplers December, 2010.  He has stable disease. No further workup is needed.  Problem # 3:  COPD (ICD-496) The patient has had significant COPD for many years.  This is stable however.  No further workup needed.  Problem # 4:  AORTIC VALVE REPLACEMENT, HX OF (ICD-V43.3)  The patient underwent aortic valve replacement with a tissue valve in 2007.  It is now time for followup 2-D echo.  This will be scheduled.  There is a systolic murmur.  Orders: Echocardiogram (Echo)  Problem # 5:  CAD (ICD-414.00)  His updated medication list for this problem includes:    Atenolol 25 Mg Tabs (Atenolol) .[name]... Take one tablet by mouth daily    Aspirin 81 Mg Tbec (Aspirin) .[name]... Take one tablet by mouth daily Coronary disease is stable.  No further workup is needed at this time.  I will arrange for the 2-D echo.  I will be reviewing his old chart for the question of whether or not he needs abdominal ultrasound for his abdominal aorta.  Patient Instructions: 1)  Your physician recommends that you schedule a follow-up appointment in: i year 2)  Your physician has requested that you have an echocardiogram.  Echocardiography is a painless test that uses sound waves to create images of your heart. It provides your doctor with information about the size and shape of your heart and how well your heart's chambers and valves are working.  This procedure takes approximately one hour. There are no restrictions for this procedure.

## 2010-03-12 NOTE — Miscellaneous (Signed)
  Clinical Lists Changes  Observations: Added new observation of PAST MED HX:  Hyperlipidemia, on medication. NIASPAN intolerant Cholecystectomy by history. LBBB old aortic valve replacement...Marland KitchenMarland KitchenMarland Kitchen tissue valve........January,2007. CABG....Marland KitchenGNF,6213 CAD EF  45% range depression  Chronic obstructive pulmonary disease. renal stones. Prostate difficulties. Carotid artery disease....doppler  01/15/2009..stable...40-59% RICA, 0-39% LICA  dizziness historically. Gastroesophageal reflux disease. diverticulosis and irritable bowel. leg cramping that is not vascular in origin and is stable. liver cyst. permanent pacemaker.  wide complex tachycardia...post op...on a beta blockerand stable.   (04/30/2009 16:26)       Past History:  Past Medical History:  Hyperlipidemia, on medication. NIASPAN intolerant Cholecystectomy by history. LBBB old aortic valve replacement...Marland KitchenMarland KitchenMarland Kitchen tissue valve........January,2007. CABG....Marland KitchenYQM,5784 CAD EF  45% range depression  Chronic obstructive pulmonary disease. renal stones. Prostate difficulties. Carotid artery disease....doppler  01/15/2009..stable...40-59% RICA, 0-39% LICA  dizziness historically. Gastroesophageal reflux disease. diverticulosis and irritable bowel. leg cramping that is not vascular in origin and is stable. liver cyst. permanent pacemaker.  wide complex tachycardia...post op...on a beta blockerand stable.

## 2010-03-12 NOTE — Assessment & Plan Note (Signed)
Summary: per check out/sf   Visit Type:  Follow-up Primary Provider:  Lanell Persons, MD  CC:  cardiomyopathy.  History of Present Illness: The patient is seen for followup of cardiomyopathy.  I saw him last November 13, 2009.  We increased his carvedilol to 6.25 b.i.d. at that time.  He is tolerating this well.  Unfortunately he continues to have significant nosebleeds.  This has been cauterized and again we're hoping that this will be stabilized.  He's not having any chest pain or shortness of breath.  Current Medications (verified): 1)  Aciphex 20 Mg  Tbec (Rabeprazole Sodium) .... Take 1 Each Day 30 Minutes Before Meals 2)  Simvastatin 20 Mg Tabs (Simvastatin) .... Take One Tablet By Mouth Daily At Bedtime 3)  Carvedilol 6.25 Mg Tabs (Carvedilol) .... Take One Tablet By Mouth Twice A Day 4)  Furosemide 40 Mg Tabs (Furosemide) .... Take One Tablet By Mouth Daily. 5)  Aspirin 81 Mg Tbec (Aspirin) .... Take One Tablet By Mouth Daily 6)  Finasteride 5 Mg Tabs (Finasteride) .... Take 1 Tablet By Mouth Once A Day 7)  Doxazosin Mesylate 4 Mg Tabs (Doxazosin Mesylate) .... Take 1 Tablet By Mouth Once A Day 8)  Potassium Chloride Crys Cr 20 Meq Cr-Tabs (Potassium Chloride Crys Cr) .... Take One Tablet By Mouth Daily 9)  Benefiber  Powd (Wheat Dextrin) .... Uses Daily 10)  Dulcolax Stool Softener 100 Mg Caps (Docusate Sodium) .... Uses Two Times A Day 11)  Multivitamins   Tabs (Multiple Vitamin) .... Once Daily 12)  Zinc 50 Mg Tabs (Zinc) .... Once Daily 13)  Ramipril 2.5 Mg Caps (Ramipril) .... Take One Capsule By Mouth Daily  Allergies: 1)  ! Sulfa  Past History:  Past Medical History:  Hyperlipidemia, on medication. NIASPAN intolerant Cholecystectomy by history. 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  /   good valve function.... mild AI.... echo... April, 2011 CABG....Marland KitchenNFA,2130 CAD EF  45% range  /   EF 25-30%.... echo.... April, 2011..... akinesis  inferior posterior walls depression  Chronic obstructive pulmonary disease. renal stones. Prostate difficulties. Carotid artery disease....doppler  01/15/2009..stable...40-59% RICA, 0-39% LICA  dizziness historically. Gastroesophageal reflux disease. diverticulosis and irritable bowel. leg cramping that is not vascular in origin and is stable. liver cyst. permanent pacemaker.  wide complex tachycardia...post op...on a beta blockerand stable.  abdominal ultrasound... 2007.... no significant abdominal aortic aneurysm Sinus congestion    September, 2011 Nosebleeds   September, 2011...cauterized  Dr.wolicki... September, 2011 /  cauterization repeated October, 2011  Review of Systems       Patient denies fever, chills, headache, sweats, rash, change in vision, change in hearing, chest pain, cough, nausea vomiting, urinary symptoms.  All other systems are reviewed and are negative.  Vital Signs:  Patient profile:   77 year old male Height:      68 inches Weight:      148 pounds BMI:     22.58 Resp:     16 per minute BP sitting:   128 / 60  (left arm)  Vitals Entered By: Marrion Coy, CNA (December 04, 2009 12:19 PM)  Physical Exam  General:  patient is stable. Eyes:  no xanthelasma. Neck:  no jugular venous stent. Lungs:  lungs are clear.  Respiratory effort is nonlabored. Heart:  cardiac exam reveals S1 and S2 and a soft systolic murmur. Abdomen:  abdomen is soft. Extremities:  no peripheral edema. Psych:  patient is oriented to person time and place.  Affect is normal.  PPM Specifications Following MD:  Lewayne Bunting, MD     PPM Vendor:  [employer]     PPM Model Number:  [id number]     PPM Serial Number:  [id number] PPM DOI:  03/08/2005      Lead 1    Location: RA     DOI: 03/08/2005     Model #: 1788TC     Serial #: [id number]     Status: active Lead 2    Location: RV     DOI: 03/08/2005     Model #: 1788TC     Serial #: [id number]     Status: active  Magnet Response Rate:  BOL  98.6 ERI 86.3  Indications:  CHB  Explantation Comments:  Pacemaker dependent TTM's with [employer]  PPM Follow Up Pacer Dependent:  Yes      Parameters Mode:  DDDR     Lower Rate Limit:  60     Upper Rate Limit:  120 Paced AV Delay:  180     Sensed AV Delay:  180 Rate Response Parameters:  Threshold-Auto (+0), Slope-8, Reaction time-fast, Recovery time-medium  Impression & Recommendations:  Problem # 1:  * EF 30%... APRIL, 2011 We're continuing to titrate his medications up slowly.  ALT ACE will be increased up to 5 mg daily.  I would see him back for followup.  Problem # 2:  * NOSEBLEED   SEPTEMBER, 2011  Hopefully this will remain stable and he'll follow with his ENT doctor.  Hemoglobin is to be checked today.  Orders: TLB-CBC Platelet - w/Differential (85025-CBCD)  Problem # 3:  PACEMAKER, PERMANENT (ICD-V45.01) Pacemaker is checked today and it is working well.  Problem # 4:  CAROTID ARTERY DISEASE (ICD-433.10)  His updated medication list for this problem includes:    Aspirin 81 Mg Tbec (Aspirin) .[name]... Take one tablet by mouth daily The patient has fear of having a stroke.  I reminded him that we are watching his carotid Dopplers very carefully.  Patient Instructions: 1)  Your physician recommends that you schedule a follow-up appointment in: 4 to 6 weeks with Dr. Myrtis Ser. In 6 months with Dr. Ladona Ridgel 2)  Your physician has recommended you make the following change in your medication: Ramipril 5 mg take one tablet daily. Take 2 tablet of the 2.5 mg until finish. Then start the 5 mg once a day. Prescriptions: ALTACE 5 MG CAPS (RAMIPRIL) take one tablet by mouth daily  #30 x 6   Entered by:   Ollen Gross, RN, BSN   Authorized by:   Talitha Givens, MD, [facility]   Signed by:   Ollen Gross, RN, BSN on 12/04/2009   Method used:   [facility] to        [facility] [phone]* (retail)       [address]       [place], [place]  [address]        Ph: [phone]       Fax: [phone]   RxID:   [id number]

## 2010-03-12 NOTE — Miscellaneous (Signed)
  Clinical Lists Changes  Observations: Added new observation of PAST MED HX:  Hyperlipidemia, on medication. NIASPAN intolerant Cholecystectomy by history. LBBB old aortic valve replacement...Marland KitchenMarland KitchenMarland Kitchen tissue valve........January,2007. CABG....Marland KitchenKVQ,2595 CAD EF  45% range depression  Chronic obstructive pulmonary disease. renal stones. Prostate difficulties. Carotid artery disease....doppler  01/15/2009..stable...40-59% RICA, 0-39% LICA  dizziness historically. Gastroesophageal reflux disease. diverticulosis and irritable bowel. leg cramping that is not vascular in origin and is stable. liver cyst. permanent pacemaker.  wide complex tachycardia...post op...on a beta blockerand stable.  abdominal ultrasound... 2007.... no significant abdominal aortic aneurysm (05/02/2009 11:03) Added new observation of PRIMARY MD: Lanell Persons, MD (05/02/2009 11:03)       Past History:  Past Medical History:  Hyperlipidemia, on medication. NIASPAN intolerant Cholecystectomy by history. LBBB old aortic valve replacement...Marland KitchenMarland KitchenMarland Kitchen tissue valve........January,2007. CABG....Marland KitchenGLO,7564 CAD EF  45% range depression  Chronic obstructive pulmonary disease. renal stones. Prostate difficulties. Carotid artery disease....doppler  01/15/2009..stable...40-59% RICA, 0-39% LICA  dizziness historically. Gastroesophageal reflux disease. diverticulosis and irritable bowel. leg cramping that is not vascular in origin and is stable. liver cyst. permanent pacemaker.  wide complex tachycardia...post op...on a beta blockerand stable.  abdominal ultrasound... 2007.... no significant abdominal aortic aneurysm

## 2010-03-12 NOTE — Progress Notes (Signed)
Summary: Aciphex approval  Phone Note Outgoing Call   Call placed by: Ashok Cordia RN,  Jun 11, 2009 4:37 PM Summary of Call: Pt asking if we had to do prior authorization for aciphex.  Pt feels sure it will be needed.  Talked with rep at [employer].  Aciphex has been shipped to pt.  A 90 day supply is covered,  Prior authorization will propabally need to be done when pt gets a refill.  [phone] is best number to get meds approved. Initial call taken by: Ashok Cordia RN,  Jun 11, 2009 4:39 PM

## 2010-03-12 NOTE — Assessment & Plan Note (Signed)
Summary: Bill Cunningham   Visit Type:  Follow-up Primary Provider:  Lanell Persons, MD  CC:  CAD.  History of Present Illness: The patient returns for cardiology followup.  I saw him last April 10, 2009.  He is doing well.  We made plans for him to eventually have a followup two-dimensional echo.  This was done and he had the echo in April, 2011.  His ejection fraction is lower than it had been in the past.  His surgery was done in 2007.  I cannot absolutely documented his ejection fraction after surgery.  He has no symptoms.  He works actively in his yard.  He is not having chest pain.  He has mild chronic shortness of breath that is all.  He has very slight dizziness for periods of a few seconds.  Current Medications (verified): 1)  Aciphex 20 Mg  Tbec (Rabeprazole Sodium) .... Take 1 Each Day 30 Minutes Before Meals 2)  Simvastatin 20 Mg Tabs (Simvastatin) .... Take One Tablet By Mouth Daily At Bedtime 3)  Atenolol 25 Mg Tabs (Atenolol) .... Take One Tablet By Mouth Daily 4)  Furosemide 40 Mg Tabs (Furosemide) .... Take One Tablet By Mouth Daily. 5)  Aspirin 81 Mg Tbec (Aspirin) .... Take One Tablet By Mouth Daily 6)  Finasteride 5 Mg Tabs (Finasteride) .... Take 1 Tablet By Mouth Once A Day 7)  Doxazosin Mesylate 4 Mg Tabs (Doxazosin Mesylate) .... Take 1 Tablet By Mouth Once A Day 8)  Potassium Chloride Crys Cr 20 Meq Cr-Tabs (Potassium Chloride Crys Cr) .... Take One Tablet By Mouth Daily 9)  Benefiber  Powd (Wheat Dextrin) .... Uses Daily 10)  Dulcolax Stool Softener 100 Mg Caps (Docusate Sodium) .... Uses Two Times A Day 11)  Multivitamins   Tabs (Multiple Vitamin) .... Once Daily 12)  Zinc 50 Mg Tabs (Zinc) .... Once Daily 13)  Doxycycline Hyclate 100 Mg Caps (Doxycycline Hyclate) .... Two Times A Day  Allergies (verified): 1)  ! Sulfa  Past History:  Past Medical History:  Hyperlipidemia, on medication. NIASPAN intolerant Cholecystectomy by history. LBBB old aortic valve  replacement...Marland KitchenMarland KitchenMarland Kitchen tissue......January,2007.  /   good valve function.... mild AI.... echo... April, 2011 CABG....Marland KitchenZOX,0960 CAD EF  45% range  /   EF 25-30%.... echo.... April, 2011..... akinesis inferior posterior walls depression  Chronic obstructive pulmonary disease. renal stones. Prostate difficulties. Carotid artery disease....doppler  01/15/2009..stable...40-59% RICA, 0-39% LICA  dizziness historically. Gastroesophageal reflux disease. diverticulosis and irritable bowel. leg cramping that is not vascular in origin and is stable. liver cyst. permanent pacemaker.  wide complex tachycardia...post op...on a beta blockerand stable.  abdominal ultrasound... 2007.... no significant abdominal aortic aneurysm Sinus congestion    September, 2011 Nosebleeds   September, 2011  Review of Systems       Patient denies fever, chills, headache, sweats, rash, change in vision, change in hearing, chest pain, cough, nausea vomiting, urinary symptoms.  All other systems are reviewed and are negative.  Vital Signs:  Patient profile:   77 year old male Height:      68 inches Weight:      151 pounds BMI:     23.04 Pulse rate:   65 / minute BP sitting:   130 / 62  (left arm) Cuff size:   regular  Vitals Entered By: Hardin Negus, RMA (October 20, 2009 11:22 AM)  Physical Exam  General:  the patient looks quite good today. Head:  head is atraumatic. Eyes:  no xanthelasma. Neck:  no jugular  venous distention. Chest Wall:  no chest wall tenderness. Lungs:  lungs are clear.  Respiratory effort is nonlabored. Heart:  cardiac exam reveals S1-S2.  No clicks or significant murmurs. Abdomen:  abdomen is soft. Msk:  no musculoskeletal deformities. Extremities:  no peripheral edema. Skin:  no skin rashes. Psych:  patient is oriented to person time and place.  Affect is normal.   PPM Specifications Following MD:  Lewayne Bunting, MD     PPM Vendor:  [employer]     PPM Model Number:  [id number]     PPM  Serial Number:  [id number] PPM DOI:  03/08/2005      Lead 1    Location: RA     DOI: 03/08/2005     Model #: 1788TC     Serial #: [id number]     Status: active Lead 2    Location: RV     DOI: 03/08/2005     Model #: 1788TC     Serial #: [id number]     Status: active  Magnet Response Rate:  BOL 98.6 ERI 86.3  Indications:  CHB  Explantation Comments:  Pacemaker dependent TTM's with [employer]  PPM Follow Up Pacer Dependent:  Yes      Parameters Mode:  DDDR     Lower Rate Limit:  60     Upper Rate Limit:  120 Paced AV Delay:  180     Sensed AV Delay:  180 Rate Response Parameters:  Threshold-Auto (+0), Slope-8, Reaction time-fast, Recovery time-medium  Impression & Recommendations:  Problem # 1:  * NOSEBLEED   SEPTEMBER, 2011 The patient's nosebleeds are usually associated with trying to clear his sinuses with nose blowing.  He has been seen by ENT in the past.  It seems most appropriate for him to see his primary physician , and he will arrange this.  Problem # 2:  * SINUS CONGESTION... SEPTEMBER, 2011 The patient will arrange for early followup with his primary physician.  Problem # 3:  PACEMAKER, PERMANENT (ICD-V45.01) The patient's pacemaker is working properly.  Problem # 4:  CAROTID ARTERY DISEASE (ICD-433.10)  His updated medication list for this problem includes:    Aspirin 81 Mg Tbec (Aspirin) .[name]... Take one tablet by mouth daily The patient's last Doppler was December, 2010.  His Disease was moderate in stable and we'll follow up done in December, 2011.  Problem # 5:  COPD (ICD-496) The patient has chronic shortness of breath but this is stable.  No further workup.  Problem # 6:  AORTIC VALVE REPLACEMENT, HX OF (ICD-V43.3) two-dimensional echo in April, 2011 showed that his valve replacement is working very well. there is trace aortic insufficiency.  No further workup for  Problem # 7:  CAD (ICD-414.00)  His updated medication list for this problem includes:    Carvedilol  3.125 Mg Tabs (Carvedilol) .[name]... Take one tablet by mouth twice a day    Aspirin 81 Mg Tbec (Aspirin) .[name]... Take one tablet by mouth daily    Ramipril 2.5 Mg Caps (Ramipril) .[name]... Take one capsule by mouth daily Coronary disease appears to be stable.  No further workup at this time.  Problem # 8:  * EF 30%... APRIL, 2011 The patient's ejection fraction is lower than we had noted in the past.  Clinically he is doing extremely well.  There is no evidence that he's had a myocardial infarction.  I feel that he does not need exercise testing or cardiac catheterization at this time.  His volume status is stable.  I will begin to change his medication by switching the atenolol to carvedilol and adding a small dose of Altace.  Will then see him for early followup.  It is possible that his left bundle branch block and pacing function could play a role with his ejection fraction.  Patient Instructions: 1)  Stop Atenolol 2)  Start Carvedilol 3.125mg  two times a day  3)  Start Ramipril 2.5mg  daily 4)  Follow up in 3 weeks Prescriptions: RAMIPRIL 2.5 MG CAPS (RAMIPRIL) Take one capsule by mouth daily  #30 x 3   Entered by:   Meredith Staggers, RN   Authorized by:   Talitha Givens, MD, [facility]   Signed by:   Meredith Staggers, RN on 10/20/2009   Method used:   [facility]ronically to        [facility] [phone]* (retail)       [address]       [place], [place]  [address]       Ph: [phone]       Fax: [phone]   RxID:   [id number] CARVEDILOL 3.125 MG TABS (CARVEDILOL) Take one tablet by mouth twice a day  #60 x 3   Entered by:   Meredith Staggers, RN   Authorized by:   Talitha Givens, MD, [facility]   Signed by:   Meredith Staggers, RN on 10/20/2009   Method used:   Electronically to        [facility] [phone]* (retail)       [address]       [place], [place]  [address]       Ph: [phone]       Fax: [phone]   RxID:    [id number]

## 2010-03-12 NOTE — Letter (Signed)
Summary: [facility]   Ear, Nose, and Throat   Imported By: Marylou Mccoy 11/26/2009 10:00:02  _____________________________________________________________________  External Attachment:    Type:   Image     Comment:   External Document

## 2010-03-12 NOTE — Assessment & Plan Note (Signed)
Summary: PER CHECK OUT/SF   Visit Type:  Follow-up Primary Provider:  Lanell Persons, MD  CC:  LV dysfunction.  History of Present Illness: The patient is seen for followup of left ventricular dysfunction.  When I saw him last we switched his atenolol to carvedilol at a very low dose.  He was also put on small dose of ramapril.  He is tolerating these meds.  He is had some mild loosening of his stools but no diarrhea.  In the meantime he had increasing nosebleeds.  He was seen by [name] and this was treated.  This was cauterized.  He had some increased sinus problems after this but this has stabilized.  His mild dizziness is now slightly improved.  He's not had any chest pain.  He has chronic low-level shortness of breath.  Current Medications (verified): 1)  Aciphex 20 Mg  Tbec (Rabeprazole Sodium) .... Take 1 Each Day 30 Minutes Before Meals 2)  Simvastatin 20 Mg Tabs (Simvastatin) .... Take One Tablet By Mouth Daily At Bedtime 3)  Carvedilol 3.125 Mg Tabs (Carvedilol) .... Take One Tablet By Mouth Twice A Day 4)  Furosemide 40 Mg Tabs (Furosemide) .... Take One Tablet By Mouth Daily. 5)  Aspirin 81 Mg Tbec (Aspirin) .... Take One Tablet By Mouth Daily 6)  Finasteride 5 Mg Tabs (Finasteride) .... Take 1 Tablet By Mouth Once A Day 7)  Doxazosin Mesylate 4 Mg Tabs (Doxazosin Mesylate) .... Take 1 Tablet By Mouth Once A Day 8)  Potassium Chloride Crys Cr 20 Meq Cr-Tabs (Potassium Chloride Crys Cr) .... Take One Tablet By Mouth Daily 9)  Benefiber  Powd (Wheat Dextrin) .... Uses Daily 10)  Dulcolax Stool Softener 100 Mg Caps (Docusate Sodium) .... Uses Two Times A Day 11)  Multivitamins   Tabs (Multiple Vitamin) .... Once Daily 12)  Zinc 50 Mg Tabs (Zinc) .... Once Daily 13)  Ramipril 2.5 Mg Caps (Ramipril) .... Take One Capsule By Mouth Daily  Allergies (verified): 1)  ! Sulfa  Past History:  Past Medical History:  Hyperlipidemia, on medication. NIASPAN  intolerant Cholecystectomy by history. 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  /   good valve function.... mild AI.... echo... April, 2011 CABG....Marland KitchenJXB,1478 CAD EF  45% range  /   EF 25-30%.... echo.... April, 2011..... akinesis inferior posterior walls depression  Chronic obstructive pulmonary disease. renal stones. Prostate difficulties. Carotid artery disease....doppler  01/15/2009..stable...40-59% RICA, 0-39% LICA  dizziness historically. Gastroesophageal reflux disease. diverticulosis and irritable bowel. leg cramping that is not vascular in origin and is stable. liver cyst. permanent pacemaker.  wide complex tachycardia...post op...on a beta blockerand stable.  abdominal ultrasound... 2007.... no significant abdominal aortic aneurysm Sinus congestion    September, 2011 Nosebleeds   September, 2011...cauterized  Dr.wolicki... September, 2011  Review of Systems       Patient denies fever, chills, headache, sweats, rash, change in vision, change in hearing, chest pain, cough, nausea vomiting, urinary symptoms.  All other systems are reviewed and are negative  Vital Signs:  Patient profile:   77 year old male Height:      68 inches Weight:      151 pounds BMI:     23.04 Pulse rate:   65 / minute BP sitting:   124 / 62  (left arm) Cuff size:   regular  Vitals Entered By: Hardin Negus, RMA (November 13, 2009 11:17 AM)  Physical Exam  General:  he is stable today. Eyes:  no xanthelasma Neck:  no jugular venous distention. Lungs:  lungs are clear.  Respiratory effort is nonlabored. Heart:  cardiac exam reveals S1-S2.  No clicks or significant murmurs. Abdomen:  abdomen soft. Extremities:  no peripheral edema. Psych:  patient is oriented to person time and place.  Affect is normal.   PPM Specifications Following MD:  Lewayne Bunting, MD     PPM Vendor:  [employer] Jude     PPM Model Number:  [id number]     PPM Serial Number:  [id number] DOI:  03/08/2005       Lead 1    Location: [facility]     DOI: 03/08/2005     Model #: 1788TC     Serial #: [id number]     Status: active Lead 2    Location: RV     DOI: 03/08/2005     Model #: 1788TC     Serial #: [id number]     Status: active  Magnet Response Rate:  BOL 98.6 ERI 86.3  Indications:  CHB  Explantation Comments:  Pacemaker dependent TTM's with [employer]  PPM Follow Up Pacer Dependent:  Yes      Parameters Mode:  DDDR     Lower Rate Limit:  60     Upper Rate Limit:  120 Paced AV Delay:  180     Sensed AV Delay:  180 Rate Response Parameters:  Threshold-Auto (+0), Slope-8, Reaction time-fast, Recovery time-medium  Impression & Recommendations:  Problem # 1:  * NOSEBLEED   SEPTEMBER, 2011 His nosebleeds have now been cauterized and he is doing better.  Problem # 2:  * SINUS CONGESTION... SEPTEMBER, 2011 Sinus congestion is a little bit better also.  Problem # 3:  * EF 30%... APRIL, 2011 We're titrating his meds for her LV dysfunction.  He's had some loosening of his bowels but he is stable.  Carvedilol will be increased to 6.25 p.o. b.i.d.  He will be in touch if he has any problems with diarrhea.  Otherwise I'll see him back in 3 weeks for further management  Patient Instructions: 1)  Your physician recommends that you schedule a follow-up appointment in: 3 weeks 2)  Your physician has recommended you make the following change in your medication: Increase carvedilol to 6.25 mg by mouth two times a day Prescriptions: CARVEDILOL 6.25 MG TABS (CARVEDILOL) Take one tablet by mouth twice a day  #60 x 6   Entered by:   Dossie Arbour, RN, BSN   Authorized by:   Talitha Givens, MD, [facility]   Signed by:   Dossie Arbour, RN, BSN on 11/13/2009   Method used:   Electronically to        [facility] [phone]* (retail)       [address]       [place], [place]  [address]       Ph: [phone]       Fax: [phone]   RxID:   [id number]

## 2010-03-12 NOTE — Assessment & Plan Note (Signed)
Summary: yearly check up...em   History of Present Illness Visit Type: Follow-up Visit Primary GI MD: Sheryn Bison MD FACP FAGA Primary Provider: Lanell Persons, MD Requesting Provider: n/a Chief Complaint: Yearly check up, wants to discuss a few things, No current problems History of Present Illness:   Bill Cunningham is fairly asymptomatic in terms of any GI complaints. His only problem is a disordered taste sensation that seems related to postnasal drip and chronic ENT problems. He denies true reflux symptoms taking daily AcipHex. He has regular bowel movements but continues with some slight rectal seepage. He has no rectal or abdominal pain, and no systemic complaints. He denies use of sorbitol fructose but does use breath mints, He is up-to-date on his endoscopy and colonoscopy exams. He denies hepatobiliary complaints or systemic complaints. He is followed closely by cardiology and has had angioplasties and  the valve replacement surgery.He is status post cholecystectomy.   GI Review of Systems      Denies abdominal pain, acid reflux, belching, bloating, chest pain, dysphagia with liquids, dysphagia with solids, heartburn, loss of appetite, nausea, vomiting, vomiting blood, weight loss, and  weight gain.        Denies anal fissure, black tarry stools, change in bowel habit, constipation, diarrhea, diverticulosis, fecal incontinence, heme positive stool, hemorrhoids, irritable bowel syndrome, jaundice, light color stool, liver problems, rectal bleeding, and  rectal pain. Preventive Screening-Counseling & Management      Drug Use:  no.      Current Medications (verified): 1)  Aciphex 20 Mg  Tbec (Rabeprazole Sodium) .... Take 1 Each Day 30 Minutes Before Meals 2)  Simvastatin 20 Mg Tabs (Simvastatin) .... Take One Tablet By Mouth Daily At Bedtime 3)  Atenolol 25 Mg Tabs (Atenolol) .... Take One Tablet By Mouth Daily 4)  Furosemide 40 Mg Tabs (Furosemide) .... Take One Tablet By Mouth  Daily. 5)  Aspirin 81 Mg Tbec (Aspirin) .... Take One Tablet By Mouth Daily 6)  Finasteride 5 Mg Tabs (Finasteride) .... Take 1 Tablet By Mouth Once A Day 7)  Doxazosin Mesylate 4 Mg Tabs (Doxazosin Mesylate) .... Take 1 Tablet By Mouth Once A Day 8)  Potassium Chloride Crys Cr 20 Meq Cr-Tabs (Potassium Chloride Crys Cr) .... Take One Tablet By Mouth Daily 9)  Benefiber  Powd (Wheat Dextrin) .... Uses Daily 10)  Dulcolax Stool Softener 100 Mg Caps (Docusate Sodium) .... Uses Two Times A Day  Allergies (verified): 1)  ! Sulfa  Past History:  Past medical, surgical, family and social histories (including risk factors) reviewed for relevance to current acute and chronic problems.  Past Medical History: Reviewed history from 05/02/2009 and no changes required.  Hyperlipidemia, on medication. NIASPAN intolerant Cholecystectomy by history. LBBB old aortic valve replacement...Marland KitchenMarland KitchenMarland Kitchen tissue valve........January,2007. CABG....Marland KitchenEAV,4098 CAD EF  45% range depression  Chronic obstructive pulmonary disease. renal stones. Prostate difficulties. Carotid artery disease....doppler  01/15/2009..stable...40-59% RICA, 0-39% LICA  dizziness historically. Gastroesophageal reflux disease. diverticulosis and irritable bowel. leg cramping that is not vascular in origin and is stable. liver cyst. permanent pacemaker.  wide complex tachycardia...post op...on a beta blockerand stable.  abdominal ultrasound... 2007.... no significant abdominal aortic aneurysm  Past Surgical History: Cholecystectomy 5 bypass's  last one 2007 Pacemaker Lipthotripsy  Family History: Reviewed history from 01/12/2008 and no changes required. no family history of coronary disease at a young age No FH of Colon Cancer:  Social History: Reviewed history from 01/12/2008 and no changes required. quit smoking 1956 Retired Dealer Alcohol Use -  no Illicit Drug Use - no Drug Use:  no  Review of Systems        The patient complains of fatigue and shortness of breath.  The patient denies allergy/sinus, anemia, anxiety-new, arthritis/joint pain, back pain, blood in urine, breast changes/lumps, change in vision, confusion, cough, coughing up blood, depression-new, fainting, fever, headaches-new, hearing problems, heart murmur, heart rhythm changes, itching, muscle pains/cramps, night sweats, nosebleeds, skin rash, sleeping problems, sore throat, swelling of feet/legs, swollen lymph glands, thirst - excessive, urination - excessive, urination changes/pain, urine leakage, vision changes, and voice change.    Vital Signs:  Patient profile:   77 year old male Height:      68 inches Weight:      154 pounds BMI:     23.50 BSA:     1.83 Pulse rate:   64 / minute Pulse rhythm:   irregular BP sitting:   128 / 60  (left arm)  Vitals Entered By: Merri Ray CMA Duncan Dull) (June 05, 2009 10:31 AM)  Physical Exam  General:  Well developed, well nourished, no acute distress.acyanotic.   Head:  Normocephalic and atraumatic. Eyes:  PERRLA, no icterus.exam deferred to patient's ophthalmologist.   Abdomen:  Soft, nontender and nondistended. No masses, hepatosplenomegaly or hernias noted. Normal bowel sounds. Rectal:  Normal exam.hemocult negative.  Rectal tone is normal. There are some skin tags noted anteriorly but no fissures or fistula . Prostate:  Enlarged but smooth and nonnodular prostate noted. Psych:  Alert and cooperative. Normal mood and affect.   Impression & Recommendations:  Problem # 1:  DIVERTICULOSIS OF COLON (ICD-562.10) Assessment Unchanged Continue high fiber diet, fiber supplements, liberal p.o. fluids. Orders: TLB-B12, Serum-Total ONLY (16109-U04) TLB-Ferritin (82728-FER) TLB-Folic Acid (Folate) (82746-FOL) TLB-IBC Pnl (Iron/FE;Transferrin) (83550-IBC) TLB-Magnesium (Mg) (83735-MG) T- * Misc. Laboratory test [phone])  Problem # 2:  GERD (ICD-530.81) Assessment: Improved reflux  regime reviewed, continue daily AcipHex. Screening labs have been ordered. Orders: TLB-B12, Serum-Total ONLY (11914-N82) TLB-Ferritin (82728-FER) TLB-Folic Acid (Folate) (82746-FOL) TLB-IBC Pnl (Iron/FE;Transferrin) (83550-IBC) TLB-Magnesium (Mg) (83735-MG) T- * Misc. Laboratory test [phone])  Problem # 3:  CORONARY ARTERY BYPASS GRAFT, HX OF (ICD-V45.81) Assessment: Comment Only Continued cardiology followup as scheduled  Problem # 4:  ESSENTIAL HYPERTENSION, BENIGN (ICD-401.1) Assessment: Improved blood pressure today normal at 128/60. Continue other medications as per primary care.  Patient Instructions: 1)  Please go to the basement for lab. 2)  Please continue current medications.  3)  The medication list was reviewed and reconciled.  All changed / newly prescribed medications were explained.  A complete medication list was provided to the patient / caregiver. 4)  High Fiber, Low Fat  Healthy Eating Plan brochure given.  5)  Please schedule a follow-up appointment as needed.  6)  Copy sent to : Dr. Lanell Persons  Appended Document: yearly check up...em    Clinical Lists Changes  Medications: Changed medication from ACIPHEX 20 MG  TBEC (RABEPRAZOLE SODIUM) Take 1 each day 30 minutes before meals to ACIPHEX 20 MG  TBEC (RABEPRAZOLE SODIUM) Take 1 each day 30 minutes before meals - Signed Rx of ACIPHEX 20 MG  TBEC (RABEPRAZOLE SODIUM) Take 1 each day 30 minutes before meals;  #90 x 3;  Signed;  Entered by: Ashok Cordia RN;  Authorized by: Mardella Layman MD [facility];  Method used: Electronically to [facility]*, , ,   , Ph: [phone], Fax: [phone]    Prescriptions: ACIPHEX 20 MG  TBEC (RABEPRAZOLE SODIUM) Take 1 each day  30 minutes before meals  #90 x 3   Entered by:   Ashok Cordia RN   Authorized by:   Mardella Layman MD [facility]   Signed by:   Ashok Cordia RN on 06/05/2009   Method used:   Electronically to        [facility]* (mail-order)             ,           Ph: [phone]       Fax: [phone]   RxID:   [id number]

## 2010-03-12 NOTE — Cardiovascular Report (Signed)
Summary: Office Visit   Office Visit   Imported By: Roderic Ovens 12/12/2009 09:44:46  _____________________________________________________________________  External Attachment:    Type:   Image     Comment:   External Document

## 2010-03-12 NOTE — Progress Notes (Signed)
Summary: Aciphex needs auth  Phone Note Call from Patient Call back at Home Phone [phone]   Call For: Dr Jarold Motto Summary of Call: Aciphex needs new auth because ir runs out on 03-15-09. Doesnt need medications just the authorization. Initial call taken by: Leanor Kail [facility],  March 06, 2009 11:19 AM  Follow-up for Phone Call        Prior aurthorization can not be initated until rx has been denied.  Pt notified.  Pt request that we try to get aurth again after the 5th.  Uses [facility]. Follow-up by: Ashok Cordia RN,  March 10, 2009 4:55 PM

## 2010-03-12 NOTE — Procedures (Signed)
Summary: Cardiology Device Clinic   Allergies: 1)  ! Sulfa  PPM Specifications Following MD:  Lewayne Bunting, MD     PPM Vendor:  [employer] Jude     PPM Model Number:  [id number]     PPM Serial Number:  [id number] DOI:  03/08/2005      Lead 1    Location: RA     DOI: 03/08/2005     Model #: 1788TC     Serial #: [id number]     Status: active Lead 2    Location: RV     DOI: 03/08/2005     Model #: 1788TC     Serial #: [id number]     Status: active  Magnet Response Rate:  BOL 98.6 ERI 86.3  Indications:  CHB  Explantation Comments:  Pacemaker dependent TTM's with [employer]  PPM Follow Up Remote Check?  No Battery Voltage:  2.78 V     Battery Est. Longevity:  6 years     Pacer Dependent:  Yes       PPM Device Measurements At[facility]  Amplitude: 1.0 mV, Impedance: 451 ohms, Threshold: 0.75 V at 0.4 msec Right Ventricle  Amplitude: 7.0 mV, Impedance: 287 ohms, Threshold: 0.75 V at 0.4 msec  Episodes MS Episodes:  0     Percent Mode Switch:  <1%     Coumadin:  No Atrial Pacing:  68%     Ventricular Pacing:  99%  Parameters Mode:  DDDR     Lower Rate Limit:  60     Upper Rate Limit:  120 Paced AV Delay:  180     Sensed AV Delay:  180 Rate Response Parameters:  Threshold-Auto (+0), Slope-8, Reaction time-fast, Recovery time-medium Next Cardiology Appt Due:  05/10/2010 Tech Comments:  No parameter changes.  Device function normal.  TTM's with [employer].  ROV 6 months with Dr. Ladona Ridgel. Altha Harm, LPN  December 04, 2009 12:44 PM

## 2010-03-12 NOTE — Cardiovascular Report (Signed)
Summary: Office Visit   Office Visit   Imported By: Roderic Ovens 05/07/2009 15:52:18  _____________________________________________________________________  External Attachment:    Type:   Image     Comment:   External Document

## 2010-03-12 NOTE — Cardiovascular Report (Signed)
Summary: TTM   TTM   Imported By: Roderic Ovens 11/18/2009 13:05:55  _____________________________________________________________________  External Attachment:    Type:   Image     Comment:   External Document

## 2010-03-18 NOTE — Cardiovascular Report (Signed)
Summary: TTM   TTM   Imported By: Roderic Ovens 03/10/2010 12:02:24  _____________________________________________________________________  External Attachment:    Type:   Image     Comment:   External Document

## 2010-03-23 ENCOUNTER — Encounter: Payer: Self-pay | Admitting: Cardiology

## 2010-03-23 ENCOUNTER — Ambulatory Visit (INDEPENDENT_AMBULATORY_CARE_PROVIDER_SITE_OTHER): Payer: Medicare Other | Admitting: Cardiology

## 2010-03-23 DIAGNOSIS — I519 Heart disease, unspecified: Secondary | ICD-10-CM

## 2010-03-23 DIAGNOSIS — E785 Hyperlipidemia, unspecified: Secondary | ICD-10-CM | POA: Insufficient documentation

## 2010-03-23 DIAGNOSIS — I428 Other cardiomyopathies: Secondary | ICD-10-CM

## 2010-03-23 DIAGNOSIS — I447 Left bundle-branch block, unspecified: Secondary | ICD-10-CM

## 2010-03-23 NOTE — Patient Instructions (Signed)
Return in 2 weeks

## 2010-03-23 NOTE — Progress Notes (Signed)
Subjective:      Patient ID: Bill Cunningham is a 77 y.o. male.  Chief Complaint: HPI  ROS  The patient denies any significant problems.  Review of systems is negative.   Objective:    Physical Exam  Physical exam for the purpose of this particular note is limited. Lab Review:  not applicable    Assessment:  As noted below Plan:   * Hyperlipidemia   The patient is treated.  No change in therapy.  Left bundle branch block   This is an old problem.  No change in therapy.  Aortic valve replacement   Surgery was in 2007.  Last echo in April, 2011.. Working well  Left ventricular dysfunction    In April, 2011 the ejection fraction was 25-30%.  Since that time his meds have been adjusted for LV dysfunction.  I chose not to change them any further today.  We will plan for a followup echo.  COPD    He is actually doing well in this regard.  Carotid artery disease   Stable by recent Doppler

## 2010-03-23 NOTE — Progress Notes (Signed)
Subjective:      Patient ID: Bill Cunningham is a 77 y.o. male.  Chief Complaint: HPI  The patient's feeling well.  He mentions mild swelling.   ROS The patient denies any significant problems.  Review of systems is negative.   Objective:    Physical Exam Physical exam for the purpose of this particular note is limited. Lab Review:  not applicable    Assessment:  As noted below Plan:   * Hyperlipidemia   The patient is treated.  No change in therapy.  Left bundle branch block   This is an old problem.  No change in therapy.  Aortic valve replacement   Surgery was in 2007.  Last echo in April, 2011.. Working well  Left ventricular dysfunction    In April, 2011 the ejection fraction was 25-30%.  Since that time his meds have been adjusted for LV dysfunction.  I chose not to change them any further today.  We will plan for a followup echo.  COPD    He is actually doing well in this regard.  Carotid artery disease   Stable by recent Doppler

## 2010-04-01 NOTE — Assessment & Plan Note (Signed)
Summary: 1 month rov/sl ok oer Heather/sl/kl   Visit Type:  Follow-up Primary Provider:  Lanell Persons, MD  CC:  cardiomyopathy.  History of Present Illness: The patient is seen for followup of cardiomyopathy.  I have been titrating his meds.  I saw him last February 24, 2010.  His carvedilol dose was increased to 12.5 mg b.i.d.  He is tolerating this well.  He thinks that he may have had slight intermittent edema since then.  It's gone in the morning and is not worried about.  He has not had syncope or presyncope.  He is not having any significant shortness of breath.  Current Medications (verified): 1)  Aciphex 20 Mg  Tbec (Rabeprazole Sodium) .... Take 1 Each Day 30 Minutes Before Meals 2)  Simvastatin 20 Mg Tabs (Simvastatin) .... Take One Tablet By Mouth Daily At Bedtime 3)  Carvedilol 12.5 Mg Tabs (Carvedilol) .... Take One Tablet By Mouth Twice A Day 4)  Furosemide 40 Mg Tabs (Furosemide) .... Take One Tablet By Mouth Daily. 5)  Aspirin 81 Mg Tbec (Aspirin) .... Take One Tablet By Mouth Daily 6)  Finasteride 5 Mg Tabs (Finasteride) .... Take 1 Tablet By Mouth Once A Day 7)  Doxazosin Mesylate 4 Mg Tabs (Doxazosin Mesylate) .... Take 1 Tablet By Mouth Once A Day 8)  Potassium Chloride Crys Cr 20 Meq Cr-Tabs (Potassium Chloride Crys Cr) .... Take One Tablet By Mouth Daily 9)  Benefiber  Powd (Wheat Dextrin) .... Uses Daily 10)  Dulcolax Stool Softener 100 Mg Caps (Docusate Sodium) .... Uses Two Times A Day 11)  Multivitamins   Tabs (Multiple Vitamin) .... Once Daily 12)  Zinc 50 Mg Tabs (Zinc) .... Once Daily 13)  Ramipril 10 Mg Caps (Ramipril) .... Take One Capsule By Mouth Daily  Allergies (verified): 1)  ! Sulfa  Past History:  Past Medical History:  Hyperlipidemia, on medication. NIASPAN intolerant Cholecystectomy by history. 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  /   good valve function.... mild AI.... echo... April,  2011 CABG....Marland KitchenQIH,4742 CAD EF  45% range  /   EF 25-30%.... echo.... April, 2011..... akinesis inferior posterior walls depression  Chronic obstructive pulmonary disease. renal stones. Prostate difficulties..... Carotid artery disease....doppler  01/15/2009..stable...40-59% RICA, 0-39% LICA  dizziness historically. Gastroesophageal reflux disease. diverticulosis and irritable bowel. leg cramping that is not vascular in origin and is stable. liver cyst. permanent pacemaker.  wide complex tachycardia...post op...on a beta blockerand stable.  abdominal ultrasound... 2007.... no significant abdominal aortic aneurysm Sinus congestion    September, 2011 Nosebleeds   September, 2011...cauterized  Dr.wolicki... September, 2011 /  cauterization repeated October, 2011.[name]  Review of Systems       Patient denies fever, chills, headache, sweats, rash, change in vision, change in hearing, chest pain, cough, nausea vomiting, urinary symptoms.  All other systems are reviewed and are negative.  Vital Signs:  Patient profile:   77 year old male Height:      68 inches Weight:      155 pounds Pulse rate:   60 / minute BP sitting:   132 / 68  (left arm) Cuff size:   regular  Vitals Entered By: Hardin Negus, RMA (March 23, 2010 11:09 AM)  Physical Exam  General:  he looks quite stable today. Head:  head is atraumatic. Eyes:  no xanthelasma. Neck:  no jugular venous distention. Lungs:  lungs are clear.  Respiratory effort is nonlabored. Heart:  cardiac exam reveals S1-S2.  No clicks or significant murmurs. Abdomen:  Bowel sounds positive; abdomen soft and non-tender without masses, organomegaly, or hernias noted. No hepatosplenomegaly. Extremities:  no peripheral edema. Psych:  patient is oriented to person time and place.  Affect is normal.   PPM Specifications Following MD:  Lewayne Bunting, MD     PPM Vendor:  [employer]     PPM Model Number:  [id number]     PPM Serial Number:  [id number] PPM DOI:   03/08/2005      Lead 1    Location: [facility]     DOI: 03/08/2005     Model #: [id number]     Serial #: [id number]     Status: active Lead 2    Location: RV     DOI: 03/08/2005     Model #: [id number]     Serial #: [id number]     Status: active  Magnet Response Rate:  BOL 98.6 ERI 86.3  Indications:  CHB  Explantation Comments:  Pacemaker dependent TTM's with [employer]  PPM Follow Up Pacer Dependent:  Yes      Episodes Coumadin:  No  Parameters Mode:  DDDR     Lower Rate Limit:  60     Upper Rate Limit:  120 Paced AV Delay:  180     Sensed AV Delay:  180 Rate Response Parameters:  Threshold-Auto (+0), Slope-8, Reaction time-fast, Recovery time-medium  Impression & Recommendations:  Problem # 1:  * EF 30%... APRIL, 2011  I am inclined to not change his medicines any further at this time.  His pulse is 60.  He is on 12.5 of carvedilol b.i.d.  He is on an ACE inhibitor.  His meds have been very carefully adjusted.  It is now time to do a followup 2-D echo to reassess his LV function.  This will be scheduled.  Orders: Echocardiogram (Echo)  Problem # 2:  PACEMAKER, PERMANENT (ICD-V45.01) Pacemaker works well.  No change in therapy.  Problem # 3:  AORTIC VALVE REPLACEMENT, HX OF (ICD-V43.3)  This is stable.  No change in therapy.  Orders: Echocardiogram (Echo)  Patient Instructions: 1)  Your physician has requested that you have an echocardiogram.  Echocardiography is a painless test that uses sound waves to create images of your heart. It provides your doctor with information about the size and shape of your heart and how well your heart's chambers and valves are working.  This procedure takes approximately one hour. There are no restrictions for this procedure.  Needs in April 2)  Follow up with Dr Myrtis Ser 1-2 weeks after echo

## 2010-04-13 ENCOUNTER — Telehealth: Payer: Self-pay | Admitting: Internal Medicine

## 2010-04-21 NOTE — Progress Notes (Signed)
Summary: pt needs refill  Phone Note Refill Request Call back at Home Phone [phone] Message from:  Patient  Refills Requested: Medication #1:  FUROSEMIDE 40 MG TABS Take one tablet by mouth daily. [facility]....pt needs a 90day supply...please call pt and make him aware when this done  Initial call taken by: Omer Jack,  April 13, 2010 11:25 AM  Follow-up for Phone Call        called pt -- he is aware Follow-up by: Hardin Negus, RMA,  April 13, 2010 4:24 PM    Prescriptions: FUROSEMIDE 40 MG TABS (FUROSEMIDE) Take one tablet by mouth daily.  #90 x 3   Entered by:   Hardin Negus, RMA   Authorized by:   Talitha Givens, MD, [facility]   Signed by:   Hardin Negus, RMA on 04/13/2010   Method used:   Faxed to ...       [facility] (mail-order)             , [place]         Ph: [phone]       Fax: [phone]   RxID:   [id number]

## 2010-05-14 ENCOUNTER — Other Ambulatory Visit (HOSPITAL_COMMUNITY): Payer: Self-pay | Admitting: Cardiology

## 2010-05-18 ENCOUNTER — Ambulatory Visit (HOSPITAL_COMMUNITY): Payer: Medicare Other | Attending: Cardiology

## 2010-05-18 DIAGNOSIS — I428 Other cardiomyopathies: Secondary | ICD-10-CM

## 2010-05-21 ENCOUNTER — Encounter: Payer: Self-pay | Admitting: Internal Medicine

## 2010-05-21 DIAGNOSIS — I442 Atrioventricular block, complete: Secondary | ICD-10-CM

## 2010-05-26 ENCOUNTER — Encounter: Payer: Self-pay | Admitting: Cardiology

## 2010-05-26 ENCOUNTER — Ambulatory Visit (INDEPENDENT_AMBULATORY_CARE_PROVIDER_SITE_OTHER): Payer: Medicare Other | Admitting: Cardiology

## 2010-05-26 DIAGNOSIS — I2510 Atherosclerotic heart disease of native coronary artery without angina pectoris: Secondary | ICD-10-CM

## 2010-05-26 DIAGNOSIS — I255 Ischemic cardiomyopathy: Secondary | ICD-10-CM | POA: Insufficient documentation

## 2010-05-26 DIAGNOSIS — I519 Heart disease, unspecified: Secondary | ICD-10-CM

## 2010-05-26 DIAGNOSIS — E785 Hyperlipidemia, unspecified: Secondary | ICD-10-CM

## 2010-05-26 NOTE — Progress Notes (Signed)
HPI Patient is seen today to followup his cardiomyopathy.  I saw him last April 01, 2010.  He has tolerated his medicines very well.  He has chronic shortness of breath with significant lung disease.  We decided to proceed with a followup 2-D echo.  His ejection fraction remains in the 25-30% range.  There is old inferior and posterior akinesis.  There does not appear to have been significant improvement with medications for his cardiomyopathy.  He has no chest pain.  There's been no syncope or presyncope.  Allergies  Allergen Reactions  . Sulfonamide Derivatives     Current Outpatient Prescriptions  Medication Sig Dispense Refill  . aspirin 81 MG tablet Take 81 mg by mouth daily.        . carvedilol (COREG) 12.5 MG tablet Take 12.5 mg by mouth 2 (two) times daily with meals.        . docusate sodium (COLACE) 100 MG capsule Take 100 mg by mouth 2 (two) times daily.        [name] doxazosin (CARDURA) 4 MG tablet Take 4 mg by mouth daily.        . finasteride (PROSCAR) 5 MG tablet Take 5 mg by mouth daily.        . furosemide (LASIX) 40 MG tablet Take 40 mg by mouth daily.        . Multiple Vitamin (MULTIVITAMIN) tablet Take 1 tablet by mouth daily.        . potassium chloride SA (K-DUR,KLOR-CON) 20 MEQ tablet Take 20 mEq by mouth daily.        . RABEprazole (ACIPHEX) 20 MG tablet Take 20 mg by mouth daily.        . ramipril (ALTACE) 10 MG capsule Take 10 mg by mouth daily.        . simvastatin (ZOCOR) 20 MG tablet Take 20 mg by mouth at bedtime.        . Wheat Dextrin (BENEFIBER) POWD Take by mouth daily.        . Zinc 50 MG TABS Take 50 mg by mouth daily.          History   Social History  . Marital Status: Married    Spouse Name: N/A    Number of Children: N/A  . Years of Education: N/A   Occupational History  . Not on file.   Social History Main Topics  . Smoking status: Former Smoker    Quit date: 02/08/1954  . Smokeless tobacco: Not on file  . Alcohol Use: No  . Drug Use: No   . Sexually Active: Not on file   Other Topics Concern  . Not on file   Social History Narrative  . No narrative on file    No family history on file.  Past Medical History  Diagnosis Date  . Hyperlipidemia   . LBBB (left bundle branch block)   . Depression   . COPD (chronic obstructive pulmonary disease)   . Calcium oxalate renal stones   . Dizziness   . GERD (gastroesophageal reflux disease)   . Diverticulosis   . IBS (irritable bowel syndrome)   . Leg cramps     not vascular in origin   . Liver cyst   . Wide-complex tachycardia     post op - on a beta blocker   . Sinus congestion   . Bleeding nose     cauteriszed dr Lazarus Salines  / cauterization repeated 10/11  . Coronary artery disease   .  Intolerance of drug     Niaspan  . S/P aortic valve replacement with bioprosthetic valve     echo.[name] April, 2011... good valve function... mild AI  . S/P CABG (coronary artery bypass graft) January, 2007  . LV dysfunction     EF 45% / EF 25-30%, echo, April, 2011 / EF 25-30%, echo, April, 2012, akinesis of the inferior and posterior walls    Past Surgical History  Procedure Date  . Cholecystectomy   . Aortic valve replacement 02/2005  . Insert / replace / remove pacemaker   . Coronary artery bypass graft     x5  -- last one in 07  . Lithotripsy     ROS  Patient denies fever, chills, headache, sweats, rash, change in vision, change in hearing, chest pain, cough, nausea vomiting, urinary symptoms.  All other systems are reviewed and are negative.  PHYSICAL EXAM Patient is stable today.  He is oriented to person time and place.  Affect is normal.  Head is atraumatic.  Urinate is in place.  No xanthelasma.  No jugular venous distention.  Lungs reveal mild prolongation of expiratory phase.  There is no respiratory distress.  Cardiac exam reveals S1 and S2.  There is a 2/6 crescendo decrescendo systolic murmur.  The abdomen is soft.  There's no peripheral edema.  Filed Vitals:    05/26/10 1021  BP: 104/64  Pulse: 68  Resp: 16  Height: 5\' 8"  (1.727 m)  Weight: 154 lb (69.854 kg)    EKG   No EKG done today.  ASSESSMENT & PLAN

## 2010-05-26 NOTE — Assessment & Plan Note (Signed)
Patient continues to have significant left ventricular dysfunction.  He has shortness of breath that is old and related to his pulmonary disease.  I cannot rule out a component of ischemia.  Is not having chest pain.  I reviewed carefully with him how we might proceed.  He is 77 years of age.  He is active in his mentation is quite good.  We will start with a Lexus in my view.  If there is significant ischemia we will discuss this.  If not he'll be followed.  Have also carefully considered the issue of ICD.  It would not appear to be appropriate in this setting.  I have not discussed it with him.  I will keep in mind.  No change in medicines.

## 2010-05-26 NOTE — Patient Instructions (Signed)
Your physician recommends that you schedule a follow-up appointment in: 4 weeks Your physician has requested that you have a lexiscan myoview. For further information please visit [email or web address] Please follow instruction sheet, as given.  Your physician recommends that you return for lab work in: [place] office for fasting labs 272.4/nt

## 2010-05-26 NOTE — Assessment & Plan Note (Signed)
It has been a long time since we've documented his lipid and liver.  This will be done.

## 2010-06-03 ENCOUNTER — Other Ambulatory Visit (INDEPENDENT_AMBULATORY_CARE_PROVIDER_SITE_OTHER): Payer: Medicare Other | Admitting: *Deleted

## 2010-06-03 ENCOUNTER — Ambulatory Visit (HOSPITAL_COMMUNITY): Payer: Medicare Other | Attending: Cardiology | Admitting: Radiology

## 2010-06-03 VITALS — Ht 68.0 in | Wt 152.0 lb

## 2010-06-03 DIAGNOSIS — E785 Hyperlipidemia, unspecified: Secondary | ICD-10-CM

## 2010-06-03 DIAGNOSIS — R0989 Other specified symptoms and signs involving the circulatory and respiratory systems: Secondary | ICD-10-CM

## 2010-06-03 DIAGNOSIS — I2510 Atherosclerotic heart disease of native coronary artery without angina pectoris: Secondary | ICD-10-CM

## 2010-06-03 DIAGNOSIS — I25810 Atherosclerosis of coronary artery bypass graft(s) without angina pectoris: Secondary | ICD-10-CM

## 2010-06-03 DIAGNOSIS — I447 Left bundle-branch block, unspecified: Secondary | ICD-10-CM

## 2010-06-03 LAB — HEPATIC FUNCTION PANEL
Alkaline Phosphatase: 45 U/L (ref 39–117)
Bilirubin, Direct: 0.1 mg/dL (ref 0.0–0.3)
Total Protein: 6.5 g/dL (ref 6.0–8.3)

## 2010-06-03 LAB — LIPID PANEL
Cholesterol: 121 mg/dL (ref 0–200)
HDL: 34.9 mg/dL — ABNORMAL LOW (ref >39.00)
Total CHOL/HDL Ratio: 3
VLDL: 11.2 mg/dL (ref 0.0–40.0)

## 2010-06-03 MED ORDER — ADENOSINE (DIAGNOSTIC) 3 MG/ML IV SOLN
0.5600 mg/kg | Freq: Once | INTRAVENOUS | Status: AC
  Administered 2010-06-03: 38.7 mg via INTRAVENOUS

## 2010-06-03 MED ORDER — TECHNETIUM TC 99M TETROFOSMIN IV KIT
33.0000 | PACK | Freq: Once | INTRAVENOUS | Status: AC | PRN
  Administered 2010-06-03: 33 via INTRAVENOUS

## 2010-06-03 MED ORDER — TECHNETIUM TC 99M TETROFOSMIN IV KIT
10.8000 | PACK | Freq: Once | INTRAVENOUS | Status: AC | PRN
  Administered 2010-06-03: 11 via INTRAVENOUS

## 2010-06-03 NOTE — Progress Notes (Signed)
[facility] SITE 3 NUCLEAR MED [address] [place] [address] [phone]  Cardiology Nuclear Med Study  Bill Cunningham is a 77 y.o. male [id number] 06/25/1921   Nuclear Med Background Indication for Stress Test:  Evaluation for Ischemia and Graft Patency History: 1994 Angioplasty,2007 CABG x 5, AVR, 4/12 Echo- EF-25-30%,2006  Heart Catheterization,2006 Myocardial Perfusion Study EF-39% and 2007 Pacemaker Cardiac Risk Factors: Carotid Disease, Family History - CAD, History of Smoking, Hypertension, LBBB and Lipids  Symptoms:  Dizziness, DOE, Near Syncope and SOB   Nuclear Pre-Procedure Caffeine/Decaff Intake:  None NPO After: 8:00pm   Lungs:  Clear IV 0.9% NS with Angio Cath:  20g  IV Site: R Antecubital  IV Started by:  Stanton Kidney, EMT-P  Chest Size (in):  38 Cup Size: n/a  Height: 5\' 8"  (1.727 m)  Weight:  152 lb (68.947 kg)  BMI:  Body mass index is 23.11 kg/(m^2). Tech Comments:  Patient did not take any Meds.    Nuclear Med Study 1 or 2 day study: 1 day  Stress Test Type:  Adenosine  Reading MD: Willa Rough, MD  Order Authorizing Provider:  Dr. Willa Rough  Resting Radionuclide: Technetium 3m Tetrofosmin  Resting Radionuclide Dose: 10.8 mCi   Stress Radionuclide:  Technetium 21m Tetrofosmin  Stress Radionuclide Dose: 33 mCi           Stress Protocol Rest HR: 69 Stress HR: 70  Rest BP: 139/72 Stress BP: 122/70  Exercise Time (min): n/a METS: n/a     % Max HR: 52.24 bpm    Dose of Adenosine (mg):  38.7 mg Dose of Lexiscan: n/a mg  Dose of Atropine (mg): n/a Dose of Dobutamine: n/a mcg/kg/min (at max HR)  Stress Test Technologist: Bonnita Levan, RN  Nuclear Technologist:  Domenic Polite, CNMT     Rest Procedure:  Myocardial perfusion imaging was performed at rest 45 minutes following the intravenous administration of Technetium 33m Tetrofosmin. Rest ECG: Paced Rhythm  Stress Procedure:  The patient received IV adenosine at 140  mcg/kg/min for 4 minutes. Patient c/o chest tightness and light-headedness with infusion, which resolved in recovery. There were no significant changes with infusion.  Technetium 5m Tetrofosmin was injected at the 2 minute mark and quantitative spect images were obtained after a 45 minute delay. Stress ECG: No significant change from baseline ECG  QPS Raw Data Images:  Normal; no motion artifact; normal heart/lung ratio. Stress Images:  Decreased activity in the distal anterior wall, apex , and inferior wall. Rest Images:  Same as stress Subtraction (SDS):  No evidence of ischemia. Transient Ischemic Dilatation (Normal <1.22):  1.19 Lung/Heart Ratio (Normal <0.45):  0.37  Quantitative Gated Spect Images QGS EDV:  n/a QGS ESV:  n/a QGS cine images:  study not gated QGS EF: Study not gated  Impression Exercise Capacity:  Adenosine study with no exercise. BP Response:  Normal blood pressure response. Clinical Symptoms:  chest tight ECG Impression:  No significant ST segment change suggestive of ischemia. Comparison with Prior Nuclear Study: No images to compare  Overall Impression:  There is scar in the distal anterior wall, apex, and the inferoir wall. There is no definite ischemia.   Willa Rough

## 2010-06-04 NOTE — Progress Notes (Signed)
ROUTED TO DR. KATZ.Falecha Clark ° ° °

## 2010-06-08 ENCOUNTER — Encounter: Payer: Self-pay | Admitting: *Deleted

## 2010-06-11 ENCOUNTER — Other Ambulatory Visit: Payer: Self-pay | Admitting: Gastroenterology

## 2010-06-11 MED ORDER — RABEPRAZOLE SODIUM 20 MG PO TBEC: 20.0000 mg | DELAYED_RELEASE_TABLET | Freq: Every day | ORAL | Status: DC

## 2010-06-11 NOTE — Telephone Encounter (Signed)
rx sent for one year 

## 2010-06-18 ENCOUNTER — Encounter: Payer: Self-pay | Admitting: Cardiology

## 2010-06-18 DIAGNOSIS — I2510 Atherosclerotic heart disease of native coronary artery without angina pectoris: Secondary | ICD-10-CM | POA: Insufficient documentation

## 2010-06-22 ENCOUNTER — Encounter: Payer: Self-pay | Admitting: Cardiology

## 2010-06-22 ENCOUNTER — Ambulatory Visit (INDEPENDENT_AMBULATORY_CARE_PROVIDER_SITE_OTHER): Payer: Medicare Other | Admitting: Cardiology

## 2010-06-22 DIAGNOSIS — I519 Heart disease, unspecified: Secondary | ICD-10-CM

## 2010-06-22 DIAGNOSIS — E785 Hyperlipidemia, unspecified: Secondary | ICD-10-CM

## 2010-06-22 DIAGNOSIS — I2510 Atherosclerotic heart disease of native coronary artery without angina pectoris: Secondary | ICD-10-CM

## 2010-06-22 MED ORDER — SIMVASTATIN 20 MG PO TABS: 20.0000 mg | ORAL_TABLET | Freq: Every day | ORAL | Status: DC

## 2010-06-22 NOTE — Progress Notes (Signed)
HPI The patient is here for cardiology followup.  Over the past many months I have adjusted medications for his left ventricular dysfunction.  His EF did not improve.  Nuclear scan was done to rule out ischemia.  There is no obvious ischemia.  He is stable at this time.  He is very active but has significant lung disease.  He is fully alert mentally.  He is 77 years of age.  At this point I have not discussed an ICD with him.  He does have a pacemaker.  He will see Dr. Ladona Ridgel soon for followup.  Unless Dr. Ladona Ridgel feels that it is appropriate to discuss an ICD I will not bring it up. Allergies  Allergen Reactions  . Niaspan (Niacin (Antihyperlipidemic))   . Sulfonamide Derivatives     Current Outpatient Prescriptions  Medication Sig Dispense Refill  . aspirin 81 MG tablet Take 81 mg by mouth daily.        . carvedilol (COREG) 12.5 MG tablet Take 12.5 mg by mouth 2 (two) times daily with meals.        . docusate sodium (COLACE) 100 MG capsule Take 100 mg by mouth 2 (two) times daily.        [name] doxazosin (CARDURA) 4 MG tablet Take 4 mg by mouth daily.        . finasteride (PROSCAR) 5 MG tablet Take 5 mg by mouth daily.        . furosemide (LASIX) 40 MG tablet Take 40 mg by mouth daily.        . Multiple Vitamin (MULTIVITAMIN) tablet Take 1 tablet by mouth daily.        . potassium chloride SA (K-DUR,KLOR-CON) 20 MEQ tablet Take 20 mEq by mouth daily.        . RABEprazole (ACIPHEX) 20 MG tablet Take 1 tablet (20 mg total) by mouth daily.  90 tablet  3  . ramipril (ALTACE) 10 MG capsule Take 10 mg by mouth daily.        . simvastatin (ZOCOR) 20 MG tablet Take 20 mg by mouth at bedtime.        . Wheat Dextrin (BENEFIBER) POWD Take by mouth daily.        . Zinc 50 MG TABS Take 50 mg by mouth daily.          History   Social History  . Marital Status: Married    Spouse Name: N/A    Number of Children: N/A  . Years of Education: N/A   Occupational History  . Not on file.   Social History  Main Topics  . Smoking status: Former Smoker    Quit date: 02/08/1954  . Smokeless tobacco: Not on file  . Alcohol Use: No  . Drug Use: No  . Sexually Active: Not on file   Other Topics Concern  . Not on file   Social History Narrative  . No narrative on file    No family history on file.  Past Medical History  Diagnosis Date  . Hyperlipidemia   . LBBB (left bundle branch block)   . Depression   . COPD (chronic obstructive pulmonary disease)   . Calcium oxalate renal stones   . Dizziness   . GERD (gastroesophageal reflux disease)   . Diverticulosis   . IBS (irritable bowel syndrome)   . Leg cramps     not vascular in origin   . Liver cyst   . Wide-complex tachycardia  post op - on a beta blocker   . Sinus congestion   . Bleeding nose     cauteriszed dr Lazarus Salines  / cauterization repeated 10/11  . Coronary artery disease     Nuclear June 03, 2010, scar anterior wall apex and inferior wall, no definite ischemia,    not gated  . Intolerance of drug     Niaspan  . S/P aortic valve replacement with bioprosthetic valve     echo.[name] April, 2011... good valve function... mild AI  . S/P CABG (coronary artery bypass graft) January, 2007  . LV dysfunction     EF 45% / EF 25-30%, echo, April, 2011 / EF 25-30%, echo, April, 2012, akinesis of the inferior and posterior walls    Past Surgical History  Procedure Date  . Cholecystectomy   . Aortic valve replacement 02/2005  . Insert / replace / remove pacemaker   . Coronary artery bypass graft     x5  -- last one in 07  . Lithotripsy     ROS  Patient denies fever, chills, headache, sweats, rash, change in vision, change in hearing, chest pain, cough, nausea vomiting, urinary symptoms.  All other systems are reviewed and are negative  PHYSICAL EXAM Patient is oriented to person time and place.  Affect is normal.  There is no xanthelasma.  Lungs are clear.  Respiratory effort is nonlabored.  Cardiac exam reveals S1-S2.  No  clicks or significant murmurs.  Abdomen is soft.  There is no peripheral edema. Filed Vitals:   06/22/10 0953  BP: 141/62  Pulse: 60  Resp: 17  Height: 5\' 8"  (1.727 m)  Weight: 153 lb 12.8 oz (69.763 kg)    EKG  EKG is not done today.  ASSESSMENT & PLAN

## 2010-06-22 NOTE — Assessment & Plan Note (Signed)
Coronary disease is stable. No change in therapy. 

## 2010-06-22 NOTE — Patient Instructions (Signed)
Your physician recommends that you schedule a follow-up appointment in: 6 months with Dr. Katz  

## 2010-06-22 NOTE — Assessment & Plan Note (Signed)
The patient has left ventricular dysfunction as outlined above.  His meds have been adjusted and he is stable.  He is 77 years of age.  He is totally alert and functional.  I have not discussed an ICD with him.  He will see Dr. Ladona Ridgel for his pacemaker followup.  Unless Dr. Ladona Ridgel,  feels that it is appropriate to consider an ICD, I will not discuss with him.

## 2010-06-23 NOTE — Assessment & Plan Note (Signed)
[facility]                            CARDIOLOGY OFFICE NOTE   Bill Cunningham, Bill Cunningham                       MRN:          [id number]  DATE:01/12/2008                            DOB:          1921/10/30    [name] is seen for followup.  He continues to do very well.  He is  not having any chest pain.  He has no shortness of breath.  He has no  syncope.  He has a history of an aortic valve replacement.  He has a  pacemaker.  He is followed by Dr. Lewayne Cunningham.   ALLERGIES:  SULFA.   MEDICATIONS:  Furosemide, finasteride, doxazosin, K-Dur, aspirin,  atenolol, simvastatin, and Nexium.   OTHER MEDICAL PROBLEMS:  See the complete list on my note of January 27, 2007, and see the EMR also.   REVIEW OF SYSTEMS:  He is not having any GI or GU symptoms.  He is  having no fevers or chills.  He has no skin rashes.  Otherwise, his  review of systems is negative.   PHYSICAL EXAMINATION:  [name] looks great.  He is 77 years of age.  His weight is 158 pounds, blood pressure 129/57 with pulse of 64.  The  patient is oriented to person, time, and place.  Affect is normal.  HEENT reveals no xanthelasma.  He has normal extraocular motion.  There  is a soft carotid bruit.  No jugular venous distention.  Lungs are  clear.  Respiratory effort is not labored.  Cardiac exam reveals an S1  with an S2.  There is a 2/6 systolic murmur.  There is no aortic  insufficiency heard.  The abdomen is soft.  He has no peripheral edema.   EKG today reveals that he is paced.  Carotid Doppler from January 12, 2008, reveals that he has stable bilateral carotid disease from 40-59%  and a followup can be done in 1 year.   Problems are listed completely on the note of January 27, 2007.  1. Status post tissue aortic valve in January 2007.  When I see him      next year, we will decide about a followup echo.  He has not had      one since before his surgery.  2. Ejection fraction  in the 46% range.  3. Carotid disease.  This is stable by followup study.  4. History of coronary artery bypass graft at the time his aortic      valve was done in 2007, stable.  He is on appropriate medication.  5. Status post permanent pacemaker.  This is stable.   [name] is doing well.  I will see him a year.     [name], MD, [facility]  Electronically Signed    JDK/MedQ  DD: 01/12/2008  DT: 01/12/2008  Job #: [id number]   cc:   Dellis Anes. Idell Pickles, M.D.

## 2010-06-23 NOTE — Assessment & Plan Note (Signed)
[facility]                            CARDIOLOGY OFFICE NOTE   Bill Cunningham, Bill Cunningham                       MRN:          [id number]  DATE:01/27/2007                            DOB:          14-Apr-1921    Bill Cunningham is doing very well.  He continues to be active.  He has done  very well since his heart surgery.  He is not having any chest pain.  He  has no shortness of breath.  There is no syncope or presyncope.  He does  have carotid disease.  He had a followup carotid Doppler in the past few  days.  He has bilateral 40% to 59% stenoses that are stable, and he can  have followup in 1 year.  He has his heart surgery in January of 2007  and he is doing very well.   PAST MEDICAL HISTORY:   ALLERGIES:  SULFA.   MEDICATIONS:  1. Aciphex.  2. Furosemide.  3. Finasteride.  4. Doxazosin.  5. Potassium.  6. Aspirin.  7. Atenolol.  8. Simvastatin.   OTHER MEDICAL PROBLEMS:  See the list below.   REVIEW OF SYSTEMS:  He really is having no significant complaints.  His  review of systems is negative.   PHYSICAL EXAMINATION:  Blood pressure today is 115/53, weight is 163  pounds.  The patient is oriented to person, time, and place.  Affect is normal.  HEENT:  Reveals no xanthelasma.  He has normal extraocular  motion.  There is a soft right carotid bruits.  There is no jugular venous  distension.  LUNGS:  Clear.  Respiratory effort is not labored.  CARDIAC EXAM:  Reveals an S1 with an S2.  There is a 2/6 outflow murmur.  The abdomen is soft.  There are no masses or bruits.  He has no peripheral edema.   Bill Cunningham is doing very well.   PROBLEMS:  1. Hyperlipidemia, on medication.  2. Intolerant to NIASPAN and he and I, once again, talked about this.  3. Cholecystectomy by history.  4. Old left bundle branch block.  5. Status post aortic valve replacement with a tissue valve in January      of 2007.  6. Ejection fraction in the 45% range.  7.  History of some depression historically that is stable.  8. Chronic obstructive pulmonary disease.  9. History of renal stones.  10.Prostate difficulties.  11.Carotid disease.  This is being followed once a year.  12.History of some mild dizziness historically.  13.Gastroesophageal reflux disease.  14.History of diverticulosis and irritable bowel.  15.Some leg cramping that is not vascular in origin and is stable.  16.History of liver cyst.  17.History of coronary artery bypass grafting that was done, also, in      January of 2007.  18.Status post permanent pacemaker.  This is stable.  We will check on      the exact timing of his followup.  19.Postoperative wide complex tachycardia, and he is on a beta blocker      and stable.   He is doing great.  No change in his meds at this time.  I will recheck  when his pacemaker check should be.     [name], MD, [facility]  Electronically Signed    JDK/MedQ  DD: 01/27/2007  DT: 01/28/2007  Job #: [id number]   cc:   Dellis Anes. Idell Pickles, M.D.

## 2010-06-23 NOTE — Assessment & Plan Note (Signed)
[facility]                         ELECTROPHYSIOLOGY OFFICE NOTE   JUNIEL, GROENE                       MRN:          [id number]  DATE:10/03/2007                            DOB:          Jul 01, 1921    [name] returns today for followup.  He is a very pleasant male with a  history of complete heart block, status post pacemaker insertion.  He  has a history of congestive heart failure and bypass surgery.  He has a  history of aortic valve replacement prompting the need for pacemaker.  He had no specific complaints today.  He has been fairly active.  He  denies chest pain.  He denies shortness of breath.   MEDICATIONS:  1. Furosemide 40 a day.  2. Finasteride 5 a day.  3. Doxazosin 4 a day.  4. Potassium 20 a day.  5. Aspirin 81 a day.  6. Atenolol 25 a day.  7. Simvastatin 20 a day.  8. Nexium 40 a day.   PHYSICAL EXAMINATION:  GENERAL:  He is a pleasant well-appearing elderly  man, in no acute distress.  VITAL SIGNS:  Blood pressure today was 118/70, the pulse 60 and regular,  respirations were 18, and the weight was 162 pounds. NECK:  No jugular  venous distention.  LUNGS:  Clear bilaterally to auscultation.  No wheezes, rales, or  rhonchi are present.  There was no increased work of breathing.  CARDIOVASCULAR:  Regular rate and rhythm.  Normal S1 and split S2.  There are no murmurs.  ABDOMINAL:  Soft and nontender.  There is no organomegaly.  EXTREMITIES:  No edema.   Interrogation of his pacemaker demonstrates a St. Jude identity the P-  waves were in less than 1 and the R-waves 9, the impedance 41, the  atrium 273 in the ventricle, threshold 1.25 at 0.4 in the A, 0.625 at  0.4 in the RV, and the battery voltage was 2.78 V.  He was 72% A paced  and 100% V paced.   IMPRESSION:  1. Complete heart block.  2. Coronary artery disease, status post bypass surgery.  3. Aortic valve disease, status post aortic valve replacement.   DISCUSSION:  Mr. Grattan is stable as pacemaker is working normally.  His  heart failure is nicely compensated.  We will plan to see the patient  back in the office for pacemaker follow up in 1 year.  He will follow up  at home as well with transtelephonic monitoring.     Doylene Canning. Ladona Ridgel, MD  Electronically Signed    GWT/MedQ  DD: 10/03/2007  DT: 10/04/2007  Job #: [id number]

## 2010-06-23 NOTE — Assessment & Plan Note (Signed)
[facility]                         GASTROENTEROLOGY OFFICE NOTE   DIDIER, BRANDENBURG                       MRN:          [id number]  DATE:03/30/2007                            DOB:          01-09-1922    Bill Cunningham is doing much better, but continues to have some difficulty  evacuating stool.  He denies true incontinence, abdominal pain or other  gastrointestinal symptoms.  He did respond fairly well to Align  probiotic therapy that was given to him back in October.   PAST MEDICAL HISTORY:  See my previous notes.  Adler has multiple  cardiovascular problems and has had previous aortic valve replacement  and peripheral vascular disease.   He weighs 166 pounds, which is up from his normal weight.  Blood  pressure 130/70, pulse was 60 and regular.  His abdominal exam was  entirely benign without organomegaly, masses or tenderness.  Bowel  sounds were normal.  Inspection of rectum was unremarkable as was rectal  exam with good rectal tone.  There were no rectal masses or tenderness  although his prostate was mildly enlarged.  It was symmetric and non-  nodular.  There was soft stool in the rectal vault that was guaiac  negative.   RECOMMENDATIONS:  1. High fiber diet with daily Citrucel, liberal p.o. fluids.  2. Continue irritable bowel syndrome management.  3. P.r.n. Align probiotic therapy.  4. Continue other multiple medications listed and reviewed in his      chart.     Vania Rea. Jarold Motto, MD, [facility], FAGA  Electronically Signed    DRP/MedQ  DD: 03/30/2007  DT: 03/30/2007  Job #: [id number]   cc:   [name], MD, [facility]  Dellis Anes Idell Pickles, M.D.

## 2010-06-26 NOTE — Assessment & Plan Note (Signed)
 HEALTHCARE                         ELECTROPHYSIOLOGY OFFICE NOTE   REGGINALD, PASK                       MRN:          [id number]  DATE:02/21/2006                            DOB:          [birth year]    Mr. Faulkner was seen today in the clinic on February 21, 2006, for followup  of his St. Jude model number [id number] Identity.  Date of implant was March 08, 2005, for complete heart block.  On interrogation of his device  today, his battery voltage is 2.79.  His P waves measured 0.2 to 0.3  millivolts with an atrial capture threshold of 1.25 volts at 0.4  milliseconds and an atrial lead impedance of 474.  His R waves measured  6.1 to 7.1 millivolts with a ventricular capture threshold of 0.625  volts at 0.4 milliseconds and a ventricular lead impedance of 305 ohms.  There were no mode-switch episodes noted.  Auto capture is on.  I did  increase his atrial output to 2.5 volts at 0.4 milliseconds, and he was  signed up today for telephone checks on an every-three-month basis with  [employer] with a return office visit in 1 year's time.      Altha Harm, LPN  Electronically Signed      Doylene Canning. Ladona Ridgel, MD  Electronically Signed   PO/MedQ  DD: 02/21/2006  DT: 02/21/2006  Job #: [id number]

## 2010-06-26 NOTE — Discharge Summary (Signed)
NAME:  BURR, SOFFER NO.:  [id number]   MEDICAL RECORD NO.:  [id number]          PATIENT TYPE:  INP   LOCATION:  2024                         FACILITY:  [facility]   PHYSICIAN:  Salvatore Decent. Cornelius Moras, M.D. DATE OF BIRTH:  17-Jun-1921   DATE OF ADMISSION:  03/02/2005  DATE OF DISCHARGE:  03/12/2005                                 DISCHARGE SUMMARY   ADDENDUM:  Initially, it was anticipated that Mr. Kauffmann would be ready for  discharge home on March 11, 2005; however, at the time of morning rounds,  it was noted that he had had an episode of wide complex tachycardia  overnight.  There was concern that this represented 16 beats of nonsustained  ventricular tachycardia versus supraventricular tachycardia.  Mr. Eugene had  remained asymptomatic.  After discussion with both Dr. Myrtis Ser and [facility] EP,  it was felt he should remain hospitalized for an additional day to undergo  STAT chest x-ray to verify pacer lead placement as well as echocardiogram to  evaluate his ejection fraction.  At the time of this dictation, the 2-D  echocardiogram report is still pending, however, it has been reviewed by Dr.  Myrtis Ser, who reviewed the results with Dr. Cornelius Moras.  Of note, his chest x-ray  showed right atrial and right ventricular pacer lead intact.  During his  additional day of hospitalization, he did receive more aggressive diuresis  with IV Lasix with a good response.  Overall, his renal function was felt to  remain stable over the past several days with creatinine peak at 1.6 and BUN  of 28.  His urine output remained adequate; however, on March 12, 2005,  after receiving his final dose of IV Lasix, he reported inability to urinate  and complained of full distended bladder, requiring insertion of a Foley  catheter which when placed emptied 1050 mL of urine.  Subsequently, this  prompted a Urology consult, as the patient has been seen by Dr. Aldean Ast.  However, prior to seeing the patient, the  on-call urologist spoke with Dr.  Cornelius Moras, who felt it was appropriate to discharge Mr. Buell with a Foley  catheter and leg bag, with plans to follow up with Dr. Aldean Ast the next  week.  He was started on Macrodantin until his followup appointment with Dr.  Aldean Ast.  The patient also received Foley catheter care by the nursing  staff prior to discharge .  In addition, home health nursing was arranged to  review Foley catheter care followup as well as wound care evaluation,  specifically his right distal leg incision which had developed  serosanguineous drainage over the previous 2 days.  No obvious cellulitis  was noted.  Other issues addressed during the remainder of his  hospitalization included continued followup for leukocytosis.  No definitive  source was noted.  he had had 2 prior urine cultures which were negative.  He did complete a course of ciprofloxacin.  He had also experienced some  diarrhea; however, C. difficile was negative.  His white blood count had  been trending down over the last 48 hours of his  hospitalization with the  latest result at 13,600.  Since Mr. Omalley' labs had remained stable and  because he has had no further arrhythmias, it was determined Mr. Horrigan was  appropriate for discharge home on March 12, 2005.   ADDITIONAL DISCHARGE DIAGNOSES:  1.  Postoperative urinary retention with history of benign prostatic      hypertrophy (requiring discharge with Foley catheter).  2.  Postoperative wide complex arrhythmia with last ejection fraction      documented at 40% (2-D echocardiogram results from March 12, 2005      still pending, but reviewed by Dr. Myrtis Ser).   LATEST LABORATORY DATA:  White blood cell count of 13.6, hemoglobin 10.4,  hematocrit 30.1 and platelet count of 434,000.  Sodium 138, potassium 3.8,  which was supplemented, chloride of 100, CO2 28, BUN 28, creatinine 1.6,  blood glucose 127.   UPDATED DISCHARGE MEDICATIONS:  1.  Coated aspirin  325 mg p.o. daily.  2.  Atenolol 25 mg p.o. twice daily.  3.  Zocor 10 mg p.o. nightly.  4.  Doxazosin 4 mg p.o. daily.  5.  Aciphex 20 mg p.o. daily.  6.  Finasteride 5 mg p.o. daily.  7.  Furosemide 40 mg p.o. daily.  8.  K-Dur 20 mEq p.o. daily while on Lasix.  9.  Macrodantin 50 mg p.o. nightly until further instructed by Dr. Aldean Ast      at his followup.  10. Tylenol 325 mg p.o. one to two tablets q.4-6 h. p.r.n. pain.   DISCHARGE INSTRUCTIONS:  As previously dictated.  In addition, a home health  nurse from [facility] has been arranged to monitor for wound  evaluation and Foley catheter care.  He is to place a dry gauze dressing to  his leg incision daily as needed for drainage, but should call if he  develops any sternal drainage or any purulent drainage or erythema from any  of his incisions.  He is to continue Foley catheter care as instructed prior  to his discharge.   UPDATED FOLLOWUP:  1.  He is to follow up with Dr. Myrtis Ser at [facility] on March 16, 2005 at 12 p.m.  He is to have a chest x-ray at that appointment.  2.  He is to follow up with Dr. Tressie Stalker on March 29, 2005 at 11:45      a.m.  3.  He has an appointment at [facility] on Thursday, March 18, 2005, at 9:15 a.m.  4.  He is to call to schedule a 1-week followup with Dr. Vic Blackbird.  5.  He is to follow up with Dr. Ladona Ridgel in May of 2007.  [facility]      will contact him regarding specific date and time.      Jerold Coombe, P.A.      Salvatore Decent. Cornelius Moras, M.D.  Electronically Signed    AWZ/MEDQ  D:  03/12/2005  T:  03/13/2005  Job:  [id number]   cc:   Dellis Anes. Idell Pickles, M.D.  Fax: [phone]   Willa Rough, M.D.  [address]  [address]  [place]  [place] [address]   Doylene Canning. Ladona Ridgel, M.D.  [address]  [address]  [place]  [place] [address]   Courtney Paris, M.D.  Fax: [phone]

## 2010-06-26 NOTE — Discharge Summary (Signed)
NAME:  Bill Cunningham, Bill Cunningham NO.:  [id number]   MEDICAL RECORD NO.:  [id number]          PATIENT TYPE:  INP   LOCATION:  2024                         FACILITY:  [facility]   PHYSICIAN:  Salvatore Decent. Cornelius Moras, M.D. DATE OF BIRTH:  [birth year]   DATE OF ADMISSION:  03/02/2005  DATE OF DISCHARGE:  03/11/2005                                 DISCHARGE SUMMARY   PRIMARY ADMITTING DIAGNOSES:  1.  Chest pain.  2.  Shortness of breath.   ADDITIONAL/DISCHARGE DIAGNOSES:  1.  Severe three-vessel coronary artery disease.  2.  Severe aortic stenosis.  3.  Complete heart block.  4.  Postoperative anemia.  5.  Postoperative delirium, resolved.  6.  Hypertension.  7.  Hyperlipidemia.  8.  Left bundle branch block.  9.  History of sinus bradycardia preoperatively.  10. Chronic obstructive pulmonary disease.  11. History of kidney stones.  12. Benign prostatic hypertrophy.  13. Gastroesophageal reflux disease.  14. Diverticulosis.  15. History of liver cysts.  16. ECCVOD.  17. Remote history of tobacco abuse.   PROCEDURE PERFORMED:  1.  Coronary artery bypass grafting times five (left internal mammary artery      to the distal left anterior descending, saphenous vein graft to the      diagonal, saphenous vein graft to the distal right coronary, sequential      saphenous vein to the third and fourth circumflex marginal branches).  2.  Aortic valve replacement with 25-mm Medtronic Mosaic Ultra porcine      bioprosthesis.  3.  Endoscopic vein harvest, right leg.  4.  Implantation of dual-chamber pacemaker.   HISTORY:  The patient is an 77 year old male with a known history of aortic  stenosis and coronary artery disease who has been followed for several years  by Dr. Myrtis Ser. Over the last one to two years he has had increasing symptoms  of chest tightness and associated shortness of breath.  He recently returned  for follow up to see Dr. Myrtis Ser and underwent a 2-D echocardiogram which  showed severe aortic stenosis with an ejection fraction estimated at 45%.  Please see complete details from the previously dictated history and  physical.   The patient subsequently underwent left and right heart catheterization and  was found to have severe three-vessel coronary artery disease with mild to  moderate left ventricular dysfunction with an ejection fraction of 40%, and  severe aortic stenosis. He was referred to Dr. Marilu Favre Own for  consideration of surgical revascularization and aortic valve replacement.  Dr. Cornelius Moras saw the patient, reviewed his films, and agreed that his best  course of action would be to proceed with surgery at this time. He discussed  the risks, benefits, and alternatives of surgery at length with the patient  and the patient agreed to proceed with surgery.   HOSPITAL COURSE:  The patient was admitted to [facility] on March 02, 2005. He was taken to the operating room where he underwent CABG times  five as well as aortic valve replacement all as described in detail above.  He tolerated the  procedure well and was transferred to the SICU in stable  condition. He was able to be extubated shortly after surgery. He was noted  to have complete heart block and required DDD pacing. He was hemodynamically  stable, otherwise. His chest tube and hemodynamic monitoring devices were  removed and he remained in the unit for further observation. He required a  transfusion of a unit of packed red blood cells for anemia with hemoglobin  of 8.0 and hematocrit of 22.7. He remained pacer dependent with significant  bradycardia and an electrophysiology consult was obtained. The patient was  seen by Dr. Ladona Ridgel and it was felt that he would require placement of a  permanent pacemaker. He underwent placement of a dual-chamber pacemaker by  Dr. Ladona Ridgel on March 08, 2005.  He tolerated procedure well and presently  his pacer is functioning appropriately. Otherwise, he  has done well  postoperatively. He did develop an episode of acute delirium which in the  ICU.  All his narcotic medications were discontinued and he returned to his  baseline mental status without sequelae. He has been somewhat volume  overloaded and was started back on Lasix and he has been diuresed  back down  now to his preoperative weight. He has been restarted on his BPH medications  and he is voiding without problem. He has been ambulating in the halls of  cardiac rehab, phase I, and is making good progress. He is tolerating a  regular diet and is having normal bowel and bladder function. His most  recent labs on March 10, 2005, showed hemoglobin of 10, hematocrit 28.3,  white count slightly elevated at 16.2, platelet count 360,000. Sodium 133,  potassium 4.5, BUN 26, creatinine 1.5. Because of his mild leukocytosis, a  urinalysis and urine culture have been obtained and both of these were  negative. He was empirically started on Cipro and at the time of discharge  will have completed a three-day course. Chest x-ray showed improving  bibasilar atelectasis and small effusion. His surgical incision sites are  all healing well with no erythema or drainage. He will have a repeat CBC,  BMET, and chest x-ray on the morning of March 11, 2005. If he continues to  remain his stable, his labs are stable or improving, and no other acute  changes have occurred, at that point he will hopefully be ready for  discharge home.   DISCHARGE MEDICATIONS:  1.  Atenolol 25 mg daily.  2.  Zocor 10 mg q.h.s.  3.  Cardura 4 mg daily.  4.  AcipHex 20 mg daily.  5.  Proscar 5 mg daily.  6.  Lasix 40 mg daily.  7.  Tylenol 325 mg one to two q.4-6h. p.r.n. pain.  8.  Enteric-coated aspirin 325 mg daily.   DISCHARGE INSTRUCTIONS:  He is asked to refrain from driving, heavy lifting,  or strenuous activity. He may continue ambulating daily and using his incentive spirometer. He may shower daily and  clean his incisions with soap  and water. He will continue a low fat, low sodium diet.   DISCHARGE FOLLOWUP:  He will see Dr. Myrtis Ser back in the office on February  19th at 3:00 p.m. and will have a chest x-ray at that visit. He will also  follow up at the [facility] on Thursday, February 8th and with Dr.  Ladona Ridgel at a later date which will be scheduled by the [facility] office. Dr.  Cornelius Moras will see the patient in three weeks  and she will bring his chest x-ray  to this visit. He will contact our office in the interim if he experiences  any problems or has questions.      Coral Ceo, P.A.      Salvatore Decent. Cornelius Moras, M.D.  Electronically Signed    GC/MEDQ  D:  03/10/2005  T:  03/10/2005  Job:  [id number]   cc:   Dellis Anes. Idell Pickles, M.D.  Fax: [phone]   Willa Rough, M.D.  [address]  [address]  [place]  [place] [address]   Doylene Canning. Ladona Ridgel, M.D.  [address]  [address]  [place]  [place] [address]

## 2010-06-26 NOTE — Op Note (Signed)
NAME:  Bill Cunningham, Bill Cunningham NO.:  [id number]   MEDICAL RECORD NO.:  [id number]            PATIENT TYPE:   LOCATION:                                 FACILITY:   PHYSICIAN:  Salvatore Decent. Cornelius Moras, M.D.      DATE OF BIRTH:   DATE OF PROCEDURE:  03/02/2005  DATE OF DISCHARGE:                                 OPERATIVE REPORT   PREOPERATIVE DIAGNOSIS:  1.  Severe aortic stenosis.  2.  Severe 3-vessel coronary artery disease.   POSTOPERATIVE DIAGNOSIS:  1.  Severe aortic stenosis.  2.  Severe 3-vessel coronary artery disease.   PROCEDURE:  Median sternotomy for aortic valve replacement (25-mm Medtronic  Mosaic Ultra porcine bioprosthesis) and coronary artery bypass grafting  times five (left internal mammary artery to distal left anterior descending  coronary artery, saphenous vein graft to diagonal branch, saphenous vein  graft to third circumflex marginal branch with sequential saphenous vein  graft to fourth circumflex marginal branch, saphenous vein graft to distal  right coronary artery, endoscopic saphenous vein harvest from right thigh  and right lower leg).   SURGEON:  Salvatore Decent. Cornelius Moras, M.D.   ASSISTANT:  Kerin Perna, M.D.   SECOND ASSISTANT:  Jerold Coombe, P.A.   ANESTHESIA:  General.   BRIEF CLINICAL NOTE:  The patient is an 77 year old male with known history  of aortic stenosis, coronary artery disease, and hypertension. The patient  now presents with progressive symptoms of exertional chest pain and  shortness of breath. Echocardiogram demonstrates severe aortic stenosis with  mild-to-moderate left ventricular dysfunction. Cardiac catheterization  confirms the presence of severe aortic stenosis as well as severe 3-vessel  coronary artery disease. A full consultation has been dictated previously.   OPERATIVE CONSENT:  The patient and his wife have been counseled at length  regarding the indications, risks, and potential benefits of surgery.  Alternative treatment strategies have been discussed. He understands and  accepts all associated risks and desires to proceed as described.   OPERATIVE FINDINGS:  1.  Mild left ventricular dysfunction with ejection fraction estimated at      50%.  2.  Mild-to-moderate left ventricular hypertrophy.  3.  Bicuspid native aortic valve with severe aortic stenosis.  4.  Diffuse coronary artery disease.  5.  Good-quality left internal mammary artery and saphenous vein conduit for      grafting.   OPERATIVE NOTE IN DETAIL:  The patient was brought to the operating room and  on the above-mentioned date, and central monitoring established by the  anesthesia service under the care and direction of Dr. Arta Bruce.  Specifically, a Swan-Ganz catheter is placed through the right internal  jugular approach; a radial arterial line is placed. Intravenous antibiotics  are administered. Following induction with general endotracheal anesthesia,  a Foley catheter is placed. The patient's chest, abdomen, both groins, and  both lower extremities are prepared and draped in a sterile manner.   Baseline transesophageal echocardiogram is performed by Dr. Michelle Piper. This  confirms the presence of severe aortic stenosis. The aortic valve appears to  be bicuspid. There is mild left ventricular dysfunction with no significant  wall motion abnormalities. There is trivial mitral regurgitation. There is  mild-to-moderate left ventricular hypertrophy.   A median sternotomy incision is performed and the left internal mammary  artery is dissected from the chest wall and prepared for bypass grafting.  The left internal mammary artery is good-quality conduit. Simultaneously  saphenous vein is obtained from the patient's right thigh, and the upper  portion of the right lower leg using endoscopic vein harvest technique. The  saphenous vein is good-quality conduit. After the saphenous vein is removed,  the small surgical  incisions in the right lower extremity are closed in  multiple layers with running absorbable suture.   The patient is heparinized systemically. The pericardium is opened. The  ascending aorta is normal in appearance. The ascending aorta and the right  atrium are cannulated for cardiopulmonary bypass. A retrograde cardioplegic  catheter is placed through the right atrium into the coronary sinus.  Adequate heparinization is verified.   Cardiopulmonary bypass is begun. A left ventricular vent is placed through  the right superior pulmonary vein. The surface of the heart is inspected.  There is mild-to-moderate left ventricular hypertrophy. There is diffuse  coronary artery disease. The distal branches off the distal right coronary  artery are relatively small caliber. The posterolateral branches off the  distal left circumflex coronary artery are also somewhat small. Portions of  saphenous vein are trimmed to appropriate lengths. A temperature probe is  placed in the left ventricular septum. A cardioplegic catheter is placed in  the ascending aorta.   The patient is cooled to 28 degrees systemic temperature. The aortic  crossclamp is applied and cold blood cardioplegia is administered initially  in antegrade fashion through the aortic root. Iced saline slush is applied  for topical hypothermia. Supplemental cardioplegia is administered,  retrograde, through the coronary sinus catheter. The initial cardioplegic  arrest in myocardial cooling are felt to be excellent. Repeat doses of  cardioplegia are administered intermittently throughout the crossclamp  portion of the operation. Through the aortic root, down subsequently we  placed vein grafts, and retrograde through the coronary sinus catheter to  maintain left ventricular septal temperature below 15 degrees centigrade.   DESCRIPTION OF PROCEDURE:  The following distal coronary anastomoses are performed:  1.  The distal right coronary  artery is grafted with a saphenous vein graft      in an end-to-side fashion. This vessel was diffusely diseased and      relatively poor target for grafting. It measures 1.4 mm in diameter at      the site of distal bypass.  2.  The third circumflex marginal branch is grafted using a saphenous vein      graft in a side-to-side fashion. This vessel measures 1.4 mm in diameter      and is a fair-to-good quality target for grafting.  3.  The fourth circumflex marginal branch is grafted using a sequential      saphenous vein graft off of the vein placed into the third circumflex      marginal branch. This vessel measures 1.5 mm in diameter and is good      quality at the site of distal bypass.  4.  The diagonal branch off the left anterior descending coronary artery is      grafted with a saphenous vein graft in an end-to-side fashion. This      vessel actually is a bifurcated system, and  the more lateral sub-branch      of this vessel is grafted. At the site of distal bypass it measures 1.0      mm in diameter and is fair quality. It is diffusely diseased proximally.  5.  The distal left anterior descending coronary artery is grafted with left      internal mammary artery in an end-to-side fashion. This vessel measures      1.5 mm in diameter and is good quality at the site of distal bypass.   An oblique aortotomy incision is performed and the aortic valve is  inspected. The aortic valve is congenitally bicuspid and severely stenotic  and heavily calcified. The left main and the right coronary arteries are 180-  degrees opposed to each other. The aortic valve is excised sharply. The  aortic annulus is decalcified. There is a severe amount of calcium along the  noncoronary cusp, but otherwise this is technically straightforward. After  decalcification the aortic root and left ventricular chamber are irrigated  with copious iced saline solution. The aortic annulus is sized to accept a  25-mm  stented bioprosthetic valve.   Aortic valve replacement is performed using interrupted 2-0 Ethibond  horizontal mattress pledgeted sutures with pledgets in the subannular  position. A Medtronic Mosaic ultra porcine bioprosthetic valve (serial  number [id number]) is secured in place uneventfully. After completion of the  valve replacement. The left main and the right coronary arteries are  inspected and notably away from the sewing ring of the cuff. Rewarming is  begun. The aortic root is irrigated with iced saline solution. The aortotomy  is closed using a two-layer closure of running 4-0 Prolene suture. All three  proximal saphenous vein anastomoses are performed directly to the ascending  aorta prior to removal of the aortic crossclamp.   The patient is placed in Trendelenburg position. The left internal mammary artery is opened and the left ventricular septal temperature rises rapidly.  One final dose of warm retrograde hot shot cardioplegia is administered. The  patient is placed in Trendelenburg position. A Magoon needle is placed in  the ascending aorta to serve as a root vent. The lungs are ventilated and  the heart allowed to fill while all areas are evacuated through the aortic  root. The aortic crossclamp is removed after a total crossclamp time of 143  minutes.   The heart begins to beat, spontaneously, without need for cardioversion. The  retrograde cardioplegic catheter is removed. The aortotomy is inspected for  hemostasis. All proximal and distal coronary anastomoses are inspected for  hemostasis and for pregraft orientation. Epicardial pacing wires are fixed  to the right ventricular outflow tract into the right atrial appendage. The  left ventricular vent is removed.   The patient is rewarmed to 37-degrees centigrade temperature. The patient is  weaned from cardiopulmonary bypass without difficulty. The patient rhythm at  separation from bypass is a slow junctional  escape rhythm. AV sequential  pacing is employed. Total cardiopulmonary bypass time for the operation is  166 minutes. No inotropic support is required.   Follow up transesophageal echocardiogram performed by Dr. Michelle Piper demonstrates  a well seated bioprosthetic valve in the aortic position. There is no aortic  insufficiency. There is no perivalvular leak. There is no significant  residual air. Left ventricular function remains preserved. There is trivial  mitral regurgitation. No other abnormalities are noted.   The venous and arterial cannulae are removed uneventfully as is the aortic  root vent. Protamine  is administered to reverse anticoagulation. The  mediastinum and the left chest are irrigated with saline solution containing  vancomycin. Meticulous surgical hemostasis is ascertained. The mediastinum  and the left chest are drained using three chest tubes exited through  separate stab incisions inferiorly. The soft tissues anterior to the aorta  are reapproximated loosely.   The On-Q continuous pain management system is utilized for postoperative  pain control. Two 10-inch Silastic catheters supplied with the On-Q kit are  tunneled into the deep subcutaneous tissues oriented parallel to the  orientation of the sternotomy and positioned just lateral to the lateral  border of the sternum on either side just anterior to the costal cartilages.  Each catheter is flushed with 5 mL of 0.5% bupivacaine solution and  ultimately connected to continuous infusion pump.   The sternotomy is closed with single strength sternal wire. The soft tissues  anterior to the sternum are closed in multiple layers; and the skin is  closed with a running subcuticular skin closure.   The patient tolerated the procedure well and is transported to the surgical intensive care unit in stable condition. There are no intraoperative  complications. All sponge, instrument, and needle counts are verified  correct at  completion of the operation. The patient was transfused 1 unit  packed red blood cells during cardiopulmonary bypass due to anemia.      Salvatore Decent. Cornelius Moras, M.D.  Electronically Signed     CHO/MEDQ  D:  03/02/2005  T:  03/03/2005  Job:  [id number]   cc:   Willa Rough, M.D.  [address]  [address]  [place]  [place] [address]   Dellis Anes. Idell Pickles, M.D.  Fax: [phone]

## 2010-06-26 NOTE — Op Note (Signed)
NAME:  JAVONTE, ELENES NO.:  [id number]   MEDICAL RECORD NO.:  [id number]          PATIENT TYPE:  INP   LOCATION:  2304                         FACILITY:  [facility]   PHYSICIAN:  Kaylyn Layer. Michelle Piper, M.D.   DATE OF BIRTH:  1921-11-07   DATE OF PROCEDURE:  03/02/2005  DATE OF DISCHARGE:                                 OPERATIVE REPORT   PROCEDURE PERFORMED:  Transesophageal echo probe placement and monitoring  during aortic valve replacement and coronary artery bypass grafting.   INDICATIONS FOR PROCEDURE:  I was called by Salvatore Decent. Cornelius Moras, M.D. to place  transesophageal echo probe into Mr. Stubblefield who was a patient with known  aortic stenosis and coronary artery disease. He presents today for aortic  valve replacement and coronary artery bypass grafting.   DESCRIPTION OF PROCEDURE:  After uneventful induction of anesthesia and  endotracheal intubation, the transesophageal echo probe was easily passed  into the patient's stomach.  Initial short axis view of the left ventricle  showed mild left ventricular hypertrophy with no wall motion abnormalities  and good wall motion and good contraction of all segments.  Turning to the  mitral valve, the valve leaflets captured normally in all views and on  placing Color Flow Doppler across the valve there was just a faint trace of  mitral regurgitation.  The aortic outflow tract looked normal.  There was no  sign of aortic insufficiency.  The left atrium was normal.  Flow in the  pulmonary veins was forward.  Turning to the aortic valve which was a  bicuspid valve heavily calcified and by telemetry the valve area was 0.85 cm  squared.  Looking at the valve in the longitudinal view there was no aortic  insufficiency and significant supravalvular turbulence of aortic stenosis.   The measurements at the aortic valve annulus were 2.27 cm and maximum  dilatation supravalvularly the aorta measured 3.71 cm.   The interatrial septum and the  right side of the heart were normal and the  tricuspid valve was normal.   The patient was placed on cardiopulmonary bypass by Dr. Cornelius Moras and underwent  aortic valve replacement and coronary artery bypass grafting. __________  I  evaluated the patient for intracardiac air of which there was minimal  visible and the patient __________ unchanged left ventricle with no wall  motion abnormalities.  The mitral valve was unchanged with trace to 1+  central mitral regurgitation with Color Flow Doppler; however, the valve  leaflet structure looked normal.  The prosthetic valve could be seen in the  aortic root and was functioning well with no aortic insufficiency in the  left ventricular outflow tract.   At the end of the procedure, the transesophageal echo probe was removed from  the patient and he was transferred to the intensive care unit in stable  condition.           ______________________________  Kaylyn Layer Michelle Piper, M.D.     KDO/MEDQ  D:  03/02/2005  T:  03/03/2005  Job:  [id number]

## 2010-06-26 NOTE — Assessment & Plan Note (Signed)
[facility]                              CARDIOLOGY OFFICE NOTE   Bill, Cunningham                       MRN:          [id number]  DATE:09/09/2005                            DOB:          1921-05-16    Bill Cunningham is doing great.  He has recovered very nicely from his surgery.  He has some mild intermittent dizzy spells but he has had no presyncope or  syncope and I believe that he is stable in this regard.  We discussed many  of his medicines at great length.  We also discussed the fact that his taste  for food is abnormal.  I am afraid I cannot explain this at this time.   He is stable post CABG and aortic valve replacement and permanent pacemaker  placement.   PAST MEDICAL HISTORY:   ALLERGIES:  SULFA.   MEDICATIONS:  1.  AcipHex.  2.  Simvastatin.  3.  Atenolol 25 b.i.d. (to be decreased to 25 daily).  4.  Furosemide 40.  5.  Aspirin 325 to be reduced to 81 mg.  6.  Finasteride 5 mg daily.  7.  Doxazosin 4 mg daily.  8.  Potassium 20 mEq daily.   OTHER MEDICAL PROBLEMS:  See the extensive list and my note of March 16, 2005.   REVIEW OF SYSTEMS:  See the HPI.  The patient is having some bilateral  breast tenderness.  He is not no digoxin.  This is to be followed.  Otherwise, the review of systems is negative.   PHYSICAL EXAM:  Blood pressure is 128/72 with a pulse of 60.  The patient is  oriented to person, time and place.  Affect is normal.  LUNGS:  Clear.  Respiratory effort is not labored.  HEENT:  Reveals no xanthelasma.  He has normal extraocular motion.  There is  no jugular venous distension.  CARDIAC:  Exam reveals an S1 with an S2.  There is a soft flow murmur from  his aortic prosthesis.  No AI is heard.  The patient has mild bilateral  breast tenderness.  I do not feel any obvious masses at this time.  ABDOMEN:  Soft.  There are no masses or bruits.  He has no significant  peripheral edema.   His EKG reveals that  the rhythm is paced both with the magnet and without  the magnet.  He appears to be in sinus rhythm.   PROBLEMS:  1.  Hyperlipidemia on medication.  2.  Intolerance to Niaspan.  3.  Cholecystectomy.  4.  Old left bundle branch block.  5.  Status post tissue aortic valve for severe aortic stenosis.  6.  Ejection fraction in the 45% range.  We may recheck this over time but      not now.  7.  History of some depression that is stable.  8.  Chronic obstructive pulmonary disease.  9.  History of renal stones and prostate difficulties.  This has all now      stabilized post surgery.  10. Carotid disease. This has been stable and  is to be followed carefully      over time.  His last study was done in December 2006.  This should be      repeated in December of this year.  11. History of mild dizziness and he has had some recently.  12. Gastroesophageal reflux disease.  13. History of diverticulosis and irritable bowel.  14. Some leg cramping that is not vascular in origin that has been stable.  15. History of liver cysts that are present over time.  16. Status post coronary artery bypass graft recently, stable.  17. Status post permanent pacemaker, stable.  18. Postoperative wide complex tachycardia for which he is on a beta      blocker.  This is stable.  19. Poor taste of food.  I cannot explain this any further.   The patient's aspirin dose will be decreased to 81 mg, atenolol reduced to  25 once a day and I will see him back in four months and he will have a  carotid Doppler just before that visit.  He is doing very well.                               [name], MD, [facility]    JDK/MedQ  DD:  09/09/2005  DT:  09/09/2005  Job #:  [id number]   cc:   Dellis Anes. Idell Pickles, MD

## 2010-06-26 NOTE — Op Note (Signed)
NAME:  Bill Cunningham, Bill Cunningham NO.:  [id number]   MEDICAL RECORD NO.:  [id number]          PATIENT TYPE:  INP   LOCATION:  2304                         FACILITY:  [facility]   PHYSICIAN:  Doylene Canning. Ladona Ridgel, M.D.  DATE OF BIRTH:  1922/01/31   DATE OF PROCEDURE:  03/08/2005  DATE OF DISCHARGE:                                 OPERATIVE REPORT   PROCEDURE PERFORMED:  Implantation of dual-chamber pacemaker.   INDICATION:  Complete heart block status post aortic valve replacement.   I. INTRODUCTION:  The patient is an 77 year old male with a history of the  redo status post aortic valve replacement with baseline left bundle branch  block following aortic valve replacement. He is now referred for pacemaker  implantation.   II. PROCEDURE:  After informed consent was obtained, the patient was taken  to the diagnostic EP lab in the fasting state. After the usual preparation  and draping, an intravenous fentanyl and Metaxalone was given for sedation.  Then 30 mL of lidocaine was infiltrated into the left infraclavicular  region. A 6-cm incision was carried out over this region; and electrocautery  utilized to dissect down the fascial plane.   Then 10 mL of contrast was injected into the left upper extremity venous  system demonstrating a patent left subclavian vein. The subclavian vein was  punctured x2 and the St. Jude model [id number], 58-cm active fixation pacing lead,  serial number [id number] was advanced to the right ventricle. The St. Jude  model 1788-T, 52-cm active fixation pacing lead, serial number [id number] was  advanced to the right atrium. Mapping was carried out in the right ventricle  and the final site on the RV septum the R waves measured 12 mV, the pacing  impedance was 731 ohms, and the pacing threshold 0.8 volts at 0.5  milliseconds. With the ventricular lead in satisfactory position and  actively fixed.  A 10 V pacing did not stimulate the diaphragm.   Next, attention  was turned to placement of atrial lead. It was placed in the  right atrial appendage where P waves measured 2 mV and the pacing threshold  voltage 0.5 milliseconds, and the pacing impedance of 407 ohms. Again, 10 V  pacing with the lead actively fixed did not stimulate the diaphragm. With  these satisfactory parameters, the leads were secured to subpectoralis  fascia with a silk suture. The sewing sleeve was also secured with a silk  suture. Electrocautery was utilized to make a subcutaneous pocket. Kanamycin  irrigation was utilized to irrigate the pocket, and electrocautery utilized  to assure hemostasis. The St. Jude Identity ADX XL DR model [phone] dual-  chamber pacemaker serial number [id number] was connected to the atrial and  ventricular pacing leads and placed in the subcutaneous pocket. Generator  was secured with a silk suture. Additional kanamycin was utilized to  irrigate the pocket; and the incision was closed with a layer of 2-0 Vicryl,  followed by layer 3-0 Vicryl, followed by a layer of 4-0 Vicryl. Benzoin was  painted on the skin, Steri-Strips were applied; and a pressure dressing was  placed. The patient was returned to his room in satisfactory condition.   III. COMPLICATIONS:  There were no immediate procedure complications.   IV. RESULTS:  This demonstrates successful implantation of a St. Jude dual-  chamber pacemaker in a patient with complete heart block following aortic  valve replacement.           ______________________________  Doylene Canning. Ladona Ridgel, M.D.     GWT/MEDQ  D:  03/08/2005  T:  03/08/2005  Job:  [id number]   cc:   Salvatore Decent. Cornelius Moras, M.D.  [address]  [place]  [place] [address]   Willa Rough, M.D.  [address]  [address]  [place]  [place] [address]   Dellis Anes. Idell Pickles, M.D.  Fax: [phone]

## 2010-06-26 NOTE — Cardiovascular Report (Signed)
NAME:  MARVELLE, CAUDILL NO.:  [id number]   MEDICAL RECORD NO.:  [id number]          PATIENT TYPE:  OIB   LOCATION:  1962                         FACILITY:  [facility]   PHYSICIAN:  Charlies Constable, M.D. [facility] DATE OF BIRTH:  06-12-1921   DATE OF PROCEDURE:  02/03/2005  DATE OF DISCHARGE:                              CARDIAC CATHETERIZATION   CLINICAL HISTORY:  Mr. Dimaano is 77 years old, has known aortic stenosis, has  had remote PTCAs done, the last of which was in 1994.  He recently developed  exertional chest tightness and was seen by Dr. Myrtis Ser and arrangements made  for him to be evaluated with cardiac catheterization.   PROCEDURE:  Right heart catheterization was performed percutaneously through  the right femoral vein using a mini sheath and Swan-Ganz thermodilution  catheter.  Left heart catheterization was performed percutaneously through  the right femoral artery using arterial sheath and 6-French preformed  coronary catheters.  A front wall arterial puncture was performed and  Omnipaque contrast was used.  Patient tolerated the procedure well and left  the laboratory in satisfactory condition.  We crossed the aortic valve with  a right coronary catheter and a straight exchange wire.   RESULTS:   HEMODYNAMIC DATA:  The right atrial pressure was 5 mean.  The pulmonary  artery pressure was 28/8 with a mean of 17.  The pulmonary wedge pressure  was 10 mean.  Left ventricular pressure was 182/18.  The aortic pressure was  138/53 with a mean of 86.  The aortic valve gradient was 43 mm peak and 31  mm mean.  The cardiac output/index was 5.3/2.9 L/minute/sq m by  thermodilution and 3.8/2 L/minute/sq m by Fick.  The aortic valve area was  0.7 cm by Fick and 1 sq cm by thermodilution.   ANGIOGRAPHIC DATA:  Left main coronary artery:  Free of significant disease.   Left anterior descending artery:  Diffusely diseased with moderate  calcification.  There was 70% segmental  stenosis in the proximal vessel, 70%  segmental stenosis in the mid vessel, and focal 90% stenosis in the distal  vessel.  There was 90% focal stenosis in a large diagonal branch.   Circumflex artery:  Large vessel.  Gave rise to two small marginal branches,  an atrial branch, and four large posterolateral branches.  There was 70%  proximal and 80% stenosis in the mid circumflex artery.  There was 90%  stenosis in second posterolateral branch, 80% in the third posterolateral  branch branch, and 90% stenosis in the fourth posterolateral branch.   Right coronary artery:  Diffusely irregular with 40 and 50% proximal  stenoses, a long segmental 50% stenosis in the mid vessel, and 40% stenosis  in the distal vessel with irregularities throughout.  The vessel gave rise  to a posterior descending branch and four posterolateral branches.   LEFT VENTRICULOGRAM:  The left ventriculogram performed in the RAO  projection showed hypokinesis of the anterolateral wall and tip of the apex.  The estimated ejection fraction was 40%.   CONCLUSIONS:  1.  Severe three vessel  coronary artery disease with 70% proximal, 70% mid,      and 90% distal stenosis in the left anterior descending artery with 90%      stenosis in the large diagonal branch, 70% proximal and 80% mid stenosis      in the circumflex artery with 90, 80, and 90% stenoses in the second,      third, and fourth posterolateral branches, and multiple 40 and 50%      stenoses in the right coronary artery with anterolateral hypokinesis and      estimated ejection fraction of 40%.  2.  Moderately severe aortic stenosis with a peak aortic valve gradient of      43 mmHg, mean aortic valve gradient of 31 mmHg, and a calculated aortic      valve area of 0.7 sq cm by Fick and 1 sq cm by thermodilution.   RECOMMENDATIONS:  Patient has extensive coronary disease that is not very  suitable for a PCI and his aortic stenosis is moderately severe.  I think   surgery will be his best therapeutic option.           ______________________________  Charlies Constable, M.D. [facility]     BB/MEDQ  D:  02/03/2005  T:  02/03/2005  Job:  [id number]   cc:   Willa Rough, M.D.  [address]  [address]  [place]  [place] [address]   Dellis Anes. Idell Pickles, M.D.  Fax: [phone]   [facility]

## 2010-06-26 NOTE — H&P (Signed)
NAME:  Bill Cunningham, Bill NO.:  Cunningham   MEDICAL RECORD NO.:  [id number]           PATIENT TYPE:   LOCATION:                                 FACILITY:   PHYSICIAN:  Salvatore Decent. Cornelius Moras, M.D.      DATE OF BIRTH:   DATE OF ADMISSION:  03/02/2004  DATE OF DISCHARGE:                                HISTORY & PHYSICAL   PRESENTING CHIEF COMPLAINT:  Exertional chest pain with exertional shortness  of breath.   HISTORY OF PRESENT ILLNESS:  Bill Cunningham is an 77 year old gentleman with  known history of aortic stenosis and coronary artery disease who has been  followed by Dr. Myrtis Ser for several years.  His cardiac history dates back into  the 1980s when he first underwent percutaneous transluminal coronary  angioplasty in 1982 for symptoms of angina.  In 1994 he underwent a second  percutaneous coronary intervention, again for symptoms of angina.  The  patient did well and has continued to do well on medical therapy until  recently.  The patient now reports that over the last one to two years he  has had a slow, gradual, progressive worsening of symptoms of classical  exertional angina.  He describes a chest tightness or chest pressure which  is always brought on with physical activity and relieved by rest.  Over the  last one to two years this pain has increased in both frequency and severity  such that now it comes on with much less significant physical activity.  The  pain occasionally radiates down his left arm.  It is frequently associated  with shortness of breath.  The patient denies any episodes of chest pain or  chest tightness occurring at rest or with minimal activity.  He denies any  nocturnal chest pain.  He denies any history of resting shortness of breath,  PND, orthopnea, palpitations, or syncope.  He has occasional dizzy spells  particularly if he stands up suddenly and he has had some lower extremity  edema which is improved with diuretic therapy.  The patient  recently  returned for follow-up to see Dr. Myrtis Ser and he underwent a 2-D echocardiogram  on January 12, 2005.  By report this demonstrated mild dilatation of the  left ventricular chamber with mild to moderate left ventricular dysfunction  and ejection fraction estimated 45%.  There was severe aortic stenosis with  mean transvalvular gradient of 35 mmHg corresponding to a peak velocity  across the valve of 361 cm/second.  The peak aortic valve gradient was 52  mmHg and estimated aortic valve area was 0.75 sq cm.  There was associated  mild mitral annular calcification, but no significant mitral regurgitation.  The left atrium was slightly dilated.  There was question raised regarding  the possibility of a localized saccular aneurysm involving the descending  thoracic aorta.  However, a subsequent ultrasound of the aorta failed to  confirm the presence of such an aneurysm, and, in fact, two benign-appearing  cysts in the liver were identified measuring 3.9 and 4.6 cm in diameter.  Bill Cunningham subsequently underwent  left and right heart catheterization by Dr.  Charlies Constable on February 03, 2005.  He was found to have severe three-vessel  coronary artery disease.  This test also confirmed the presence of mild to  moderate left ventricular dysfunction with ejection fraction estimated 40%.  There was also severe aortic stenosis with peak and mean transvalvular  gradients of 43 and 31 mmHg, respectively and calculated aortic valve area  0.7 sq cm.  Pulmonary artery pressures were normal and the resting cardiac  output was 5.3 L/minute using the thermodilution method.  Bill Cunningham has been  referred for possible elective aortic valve replacement and coronary artery  bypass grafting.   REVIEW OF SYSTEMS:  GENERAL:  The patient reports normal appetite.  He has  not been gaining or losing weight.  CARDIAC:  Notable for progressive  symptoms of exertional angina and shortness of breath as previously  noted.  The patient denies any known history of tachy palpitations.  He does have  known history of bradycardia but he denies any syncopal episodes.  RESPIRATORY:  Negative.  The patient denies productive cough, hemoptysis,  wheezing.  GASTROINTESTINAL:  Notable in that the patient is treated for  reflux, although he denies any associated symptoms.  He denies any  difficulty swallowing.  He reports normal bowel function and he denies  history of hematochezia, hematemesis, melena.  He was found to have  asymptomatic diverticulosis on a routine colonoscopy in the past.  GENITOURINARY:  Notable for some difficulty starting his urinary stream and  some nocturia related to benign prostatic hypertrophy.  These symptoms are  stable.  The patient denies urinary urgency or frequency.  PERIPHERAL  VASCULAR:  Negative.  Patient denies symptoms suggestive of claudication.  Patient denies history of lower extremity thrombophlebitis.  NEUROLOGIC:  Negative.  The patient denies episodes of transient monocular blindness or  transient numbness or weakness involving either upper or lower extremity.  MUSCULOSKELETAL:  Notable for some mild arthritis and arthralgias that is  not limiting.  PSYCHIATRIC:  Negative.  HEENT:  Notable in that the patient  has partial upper and partial lower dentures.  The patient has not seen his  dentist for more than two years.  He is not aware that his dentist is aware  of his underlying history of aortic stenosis and he has never been given  antibiotics at the time of dental cleaning.  He denies any loose teeth of  painful teeth.   PAST MEDICAL HISTORY:  1.  Coronary artery disease.  2.  Hypertension.  3.  Aortic stenosis.  4.  Hyperlipidemia.  5.  Left bundle branch block.  6.  Bradycardia.  7.  Chronic obstructive pulmonary disease.  8.  History of kidney stones.  9.  Benign prostatic hypertrophy.  10. GE reflux disease.  11. Diverticulosis.  12. Liver cysts. 13.  Bilateral internal carotid artery stenosis, right greater than left.   PAST SURGICAL HISTORY:  1.  Cholecystectomy.  2.  Open left kidney exploration for kidney stone extraction x3.  3.  Extracorporeal shock wave lithotripsy.   SOCIAL HISTORY:  The patient is married and lives with his wife between  [place] and [place].  He is retired from the [employer].  He has a remote history of tobacco use, although he quit smoking  in 1956.  He denies history of significant alcohol consumption.  They have  one grown child and one grandchild.  The patient remains quite active  physically.  FAMILY HISTORY:  Notable in that the patient's father died of myocardial  infarction at age 32.   CURRENT MEDICATIONS:  1.  Nifedipine 60 mg daily.  2.  Simvastatin 10 mg daily.  3.  Atenolol 25 mg daily.  4.  Furosemide 40 mg daily.  5.  Aspirin 81 mg daily.  6.  Finasteride 5 mg daily.  7.  Doxazosin 4 mg daily.  8.  Aciphex 20 mg daily.   DRUG ALLERGIES:  SULFA.   PHYSICAL EXAMINATION:  GENERAL:  The patient is a thin, well-appearing white  male who appears somewhat younger than stated age in no acute distress.  VITAL SIGNS:  Blood pressure is 106/54, pulse is 56 and regular.  He is  afebrile with oxygen saturation 96% on room air.  HEENT:  Examination is notable for what appears to be good dentition.  The  patient does have partial upper and lower plates.  The neck is supple.  There is no cervical or supraclavicular lymphadenopathy.  There is no  jugular venous distension.  No carotid bruits noted.  CHEST:  Auscultation of the chest includes clear and symmetrical breath  sounds bilaterally.  No wheezes or rhonchi noted.  CARDIOVASCULAR:  Examination includes a grade 3/6 crescendo/decrescendo  systolic murmur heard best at the right upper sternal border with radiation  across the precordium into the neck.  No diastolic murmurs noted.  ABDOMEN:  Soft and nontender.  There  are no palpable masses.  Bowel sounds  are present.  EXTREMITIES:  Warm and adequately perfused.  There is trace bilateral lower  extremity edema at the ankle.  Distal pulses are diminished in both lower  legs at the ankle was nonetheless still palpable.  RECTAL:  Deferred.  GENITOURINARY:  Deferred.  NEUROLOGIC:  Grossly nonfocal and symmetrical throughout.   DIAGNOSTIC TESTS:  Results of 2-D echocardiogram performed January 12, 2005  at the [facility] Cardiology office is reviewed as is results of cardiac  catheterization performed February 03, 2005 at [facility].  There is severe aortic stenosis.  There is moderate left ventricular  dysfunction.  Ejection fraction is estimated 40%.  There is some anterior  apical, anterolateral, and inferobasal hypokinesis.  There is no mitral  regurgitation.  There is severe three-vessel coronary artery disease with  diffuse distal disease and moderate calcification of these vessels.  In the left anterior descending coronary artery there is 70% proximal stenosis  followed by 70% stenosis in the mid vessel and 90% stenosis in the distal  portion of the vessel.  There is a large bifurcating diagonal branch with  90% proximal stenosis.  Left circumflex coronary artery gives rise to large  third and fourth circumflex marginal branches.  There is 70% stenosis in the  proximal and 80% stenosis of the mid left coronary artery prior to takeoff  of the large third circumflex marginal branch.  There is 90% proximal  stenosis in the fourth circumflex marginal branch.  The fifth and sixth  circumflex marginal branches also have high-grade proximal disease, although  these vessels are smaller and diffusely diseased and probably not likely  appropriate targets for bypass grafting.  There is right dominant or  codominant coronary circulation.  There is 50% long segment stenosis of mid  right coronary artery.  This vessel is diffusely diseased  throughout,  although there are no other high-grade lesions.  Right heart pressures were  normal with PA pressure measured 28/8 and pulmonary capillary wedge pressure  of  10.  The peak and mean transvalvular gradient across the aortic valve at  catheterization was 43 in 31 mmHg, respectively.  Resting cardiac output was  normal at 5.3 L/minute corresponding to a cardiac index of 2.9 using the  thermodilution method.   IMPRESSION:  Severe aortic stenosis with severe three-vessel coronary artery  disease and worsening symptoms of progressive exertional angina and  exertional shortness of breath.  I believe that Bill Cunningham would best be  treated by elective aortic valve replacement and coronary artery bypass  grafting.  His risks associated with surgery will be somewhat elevated due  to his advanced age and associated other comorbid medical problems as noted  previously.  His long-term prognosis with medical therapy would be guarded  at best.   PLAN:  I have discussed options at length with Bill Cunningham and his wife here  in the office today.  Alternative treatment strategies have been discussed.  They understand and accept all associated risks of surgery including, but  not limited to, risk of death, stroke, myocardial infarction, congestive  heart failure, respiratory failure, pneumonia, bleeding requiring blood  transfusion, arrhythmia, heart block or bradycardia requiring permanent  pacemaker, recurrent coronary artery disease, other late complications  related to bioprosthetic aortic valve or coronary artery disease.  We have  also discussed alternative prosthetic valves that might be utilized at the  time of surgery, and under the circumstances I favor use of a bioprosthetic  tissue valve.  Bill Cunningham understands the relative risks and benefits  associated with this choice and agrees as discussed.  All of his questions  have been addressed.  We tentatively plan  to proceed with surgery on  Tuesday, January 23.  During the interim period of time Bill Cunningham will make an effort to see his family dentist for a  routine dental cleaning and examination.  He understands that he needs to  notify his dentist to make sure that oral antibiotics are given  prophylactically.      Salvatore Decent. Cornelius Moras, M.D.  Electronically Signed     CHO/MEDQ  D:  02/22/2005  T:  02/22/2005  Job:  [id number]   cc:   Bill Cunningham, M.D.  [address]  [address]  [place]  [place] [address]   Dellis Anes. Idell Pickles, M.D.  Fax: [phone]

## 2010-06-26 NOTE — Assessment & Plan Note (Signed)
[facility]                           GASTROENTEROLOGY OFFICE NOTE   Bill Cunningham, Bill Cunningham                       MRN:          [id number]  DATE:12/02/2005                            DOB:          Jun 14, 1921    Bill Cunningham is an elderly white male who has had multiple cardiovascular  issues.  He has recently had bypass surgery, replacement of his aortic  valve, and a pacemaker insertion.  He has been on multiple antibiotics over  the last year and is currently having some slight rectal seepage but denies  other GI complaints.  He does have chronic acid reflux and takes AcipHex 20  mg a day.  His main complaint today is one of bad taste in my mouth  without bad breath or any other gastrointestinal problems.  He is swallowing  well and denies any hepatobiliary complaints.  He actually is gaining weight  and not losing weight.   Previous endoscopic exam and colonoscopies in August, 2000.  He apparently  had acid reflux changes at that time.  He does have diverticulosis and  irritable bowel syndrome.  Biopsies of his esophagus in the past have not  shown evidence of Barrett's mucosa.  He does have a known liver cyst which  apparently is benign.   The weight today is 157 pounds.  Blood pressure 132/60, pulse 60 and  regular.  General physical exam is not performed at this time.   ASSESSMENT:  This is an unusual symptom and may be related to unexplained  bacterial changes in his gut or oropharyngeal area related to frequent  antibiotics.  He certainly has no odynophagia or dysphagia to suggest fungal  or viral infections.  His acid reflux is well controlled on daily AcipHex.   RECOMMENDATIONS:  We will get this patient a trial of Align probiotic  therapy for a month and proceed accordingly.  He is to call us at the end of  that time, and we will consider whether or not endoscopic exam is indicated.  He is to continue all of his other medications, which are  numerous and  listed above.  He is followed by both Dr. Idell Pickles and Dr. Myrtis Ser.     Bill Rea. Jarold Motto, Bill Cunningham, Bill Cunningham, Bill Cunningham    DRP/MedQ  DD: 12/02/2005  DT: 12/03/2005  Job #: [id number]   cc:   Dellis Anes. Idell Pickles, M.D.  [name], Bill Cunningham, [facility]

## 2010-08-04 ENCOUNTER — Encounter: Payer: Self-pay | Admitting: Internal Medicine

## 2010-08-04 ENCOUNTER — Ambulatory Visit (INDEPENDENT_AMBULATORY_CARE_PROVIDER_SITE_OTHER): Payer: Medicare Other | Admitting: Internal Medicine

## 2010-08-04 DIAGNOSIS — I2510 Atherosclerotic heart disease of native coronary artery without angina pectoris: Secondary | ICD-10-CM

## 2010-08-04 DIAGNOSIS — I10 Essential (primary) hypertension: Secondary | ICD-10-CM

## 2010-08-04 DIAGNOSIS — I442 Atrioventricular block, complete: Secondary | ICD-10-CM

## 2010-08-04 DIAGNOSIS — Z950 Presence of cardiac pacemaker: Secondary | ICD-10-CM

## 2010-08-04 NOTE — Assessment & Plan Note (Signed)
His blood pressure is well controlled. He will maintain a low-sodium diet, and continue his current medications.

## 2010-08-04 NOTE — Progress Notes (Signed)
HPI Mr. Boesel returns today for followup. He is a very pleasant 77 year old man with a history of complete heart block status post permanent pacemaker insertion, coronary artery disease status post bypass surgery, aortic stenosis status post valve replacement, hypertension, dyslipidemia, and diastolic heart failure. The patient denies chest pain or shortness of breath. He remains active, working in his garden on a regular basis. He denies syncope, fevers, chills, nausea, vomiting, or change in bowel habit. Allergies  Allergen Reactions  . Niaspan (Niacin (Antihyperlipidemic))   . Sulfonamide Derivatives      Current Outpatient Prescriptions  Medication Sig Dispense Refill  . aspirin 81 MG tablet Take 81 mg by mouth daily.        . carvedilol (COREG) 12.5 MG tablet Take 12.5 mg by mouth 2 (two) times daily with meals.        . docusate sodium (COLACE) 100 MG capsule Take 100 mg by mouth 2 (two) times daily.        [name] doxazosin (CARDURA) 4 MG tablet Take 4 mg by mouth daily.        . finasteride (PROSCAR) 5 MG tablet Take 5 mg by mouth daily.        . furosemide (LASIX) 40 MG tablet Take 40 mg by mouth daily.        . Multiple Vitamin (MULTIVITAMIN) tablet Take 1 tablet by mouth daily.        . potassium chloride SA (K-DUR,KLOR-CON) 20 MEQ tablet Take 20 mEq by mouth daily.        . RABEprazole (ACIPHEX) 20 MG tablet Take 1 tablet (20 mg total) by mouth daily.  90 tablet  3  . ramipril (ALTACE) 10 MG capsule Take 10 mg by mouth daily.        . simvastatin (ZOCOR) 20 MG tablet Take 1 tablet (20 mg total) by mouth at bedtime.  90 tablet  3  . Wheat Dextrin (BENEFIBER) POWD Take by mouth daily.        . Zinc 50 MG TABS Take 50 mg by mouth daily.           Past Medical History  Diagnosis Date  . Hyperlipidemia   . LBBB (left bundle branch block)   . Depression   . COPD (chronic obstructive pulmonary disease)   . Calcium oxalate renal stones   . Dizziness   . GERD (gastroesophageal reflux  disease)   . Diverticulosis   . IBS (irritable bowel syndrome)   . Leg cramps     not vascular in origin   . Liver cyst   . Wide-complex tachycardia     post op - on a beta blocker   . Sinus congestion   . Bleeding nose     cauteriszed dr Lazarus Salines  / cauterization repeated 10/11  . Coronary artery disease     Nuclear June 03, 2010, scar anterior wall apex and inferior wall, no definite ischemia,    not gated  . Intolerance of drug     Niaspan  . S/P aortic valve replacement with bioprosthetic valve     echo.[name] April, 2011... good valve function... mild AI  . S/P CABG (coronary artery bypass graft) January, 2007  . LV dysfunction     EF 45% / EF 25-30%, echo, April, 2011 / EF 25-30%, echo, April, 2012, akinesis of the inferior and posterior walls    ROS:   All systems reviewed and negative except as noted in the HPI.   Past Surgical History  Procedure Date  . Cholecystectomy   . Aortic valve replacement 02/2005  . Insert / replace / remove pacemaker   . Coronary artery bypass graft     x5  -- last one in 07  . Lithotripsy      No family history on file.   History   Social History  . Marital Status: Married    Spouse Name: N/A    Number of Children: N/A  . Years of Education: N/A   Occupational History  . Not on file.   Social History Main Topics  . Smoking status: Former Smoker    Quit date: 02/08/1954  . Smokeless tobacco: Not on file  . Alcohol Use: No  . Drug Use: No  . Sexually Active: Not on file   Other Topics Concern  . Not on file   Social History Narrative  . No narrative on file     BP 119/57  Pulse 56  Ht 5\' 8"  (1.727 m)  Wt 151 lb (68.493 kg)  BMI 22.96 kg/m2  Physical Exam:  Well appearing NAD HEENT: Unremarkable Neck:  No JVD, no thyromegally Lymphatics:  No adenopathy Back:  No CVA tenderness Lungs:  Clear bilaterally HEART:  Regular rate rhythm, no murmurs, no rubs, no clicks Abd:  Flat, positive bowel sounds, no  organomegally, no rebound, no guarding Ext:  2 plus pulses, no edema, no cyanosis, no clubbing Skin:  No rashes no nodules Neuro:  CN II through XII intact, motor grossly intact  DEVICE  Normal device function.  See PaceArt for details.   Assess/Plan:

## 2010-08-04 NOTE — Assessment & Plan Note (Signed)
The patient denies anginal symptoms. He will continue his current medications. I've encouraged him to remain active.

## 2010-08-04 NOTE — Assessment & Plan Note (Signed)
His device is working normally. We'll recheck in several months. 

## 2010-08-04 NOTE — Patient Instructions (Signed)
Your physician wants you to follow-up in: 12 months with Dr. Taylor You will receive a reminder letter in the mail two months in advance. If you don't receive a letter, please call our office to schedule the follow-up appointment.  Your physician recommends that you continue on your current medications as directed. Please refer to the Current Medication list given to you today.     

## 2010-09-18 ENCOUNTER — Telehealth: Payer: Self-pay | Admitting: Cardiology

## 2010-09-18 MED ORDER — CARVEDILOL 12.5 MG PO TABS: 12.5000 mg | ORAL_TABLET | Freq: Two times a day (BID) | ORAL | Status: DC

## 2010-09-18 NOTE — Telephone Encounter (Signed)
Call this into [facility] on [facility].  Call patient and confirm when called in to [facility].

## 2010-09-22 ENCOUNTER — Other Ambulatory Visit: Payer: Self-pay | Admitting: Cardiology

## 2010-09-23 MED ORDER — CARVEDILOL 12.5 MG PO TABS: 12.5000 mg | ORAL_TABLET | Freq: Two times a day (BID) | ORAL | Status: DC

## 2010-09-23 NOTE — Telephone Encounter (Signed)
Pt and [facility] called for refill of coreg, however it was called into [facility], he needs it called in again and he is out now

## 2010-10-06 ENCOUNTER — Telehealth: Payer: Self-pay | Admitting: Cardiology

## 2010-10-06 MED ORDER — POTASSIUM CHLORIDE CRYS ER 20 MEQ PO TBCR: 20.0000 meq | EXTENDED_RELEASE_TABLET | Freq: Every day | ORAL | Status: DC

## 2010-10-06 NOTE — Telephone Encounter (Signed)
Pt needs his potassium to be refill by [facility].

## 2010-11-03 ENCOUNTER — Encounter: Payer: Self-pay | Admitting: Internal Medicine

## 2010-11-03 DIAGNOSIS — I442 Atrioventricular block, complete: Secondary | ICD-10-CM

## 2010-11-22 ENCOUNTER — Encounter: Payer: Self-pay | Admitting: Internal Medicine

## 2010-12-08 ENCOUNTER — Other Ambulatory Visit: Payer: Self-pay | Admitting: Cardiology

## 2010-12-08 MED ORDER — RAMIPRIL 10 MG PO CAPS: 10.0000 mg | ORAL_CAPSULE | Freq: Every day | ORAL | Status: DC

## 2010-12-08 NOTE — Telephone Encounter (Signed)
Please call patient when this is done.

## 2010-12-23 ENCOUNTER — Encounter: Payer: Self-pay | Admitting: Cardiology

## 2010-12-23 ENCOUNTER — Ambulatory Visit (INDEPENDENT_AMBULATORY_CARE_PROVIDER_SITE_OTHER): Payer: Medicare Other | Admitting: Cardiology

## 2010-12-23 DIAGNOSIS — I2510 Atherosclerotic heart disease of native coronary artery without angina pectoris: Secondary | ICD-10-CM

## 2010-12-23 DIAGNOSIS — I519 Heart disease, unspecified: Secondary | ICD-10-CM

## 2010-12-23 NOTE — Assessment & Plan Note (Signed)
Patient's meds have been adjusted Foley for his LV dysfunction.  No further workup.  He is 77 years of age.  I have not brought up the question of an ICD.

## 2010-12-23 NOTE — Progress Notes (Signed)
HPI  [name] looks really good today.  He's not having any chest pain or shortness of breath.  He is active and he is careful with how hard he pushes himself. Allergies  Allergen Reactions  . Niaspan (Niacin (Antihyperlipidemic))   . Sulfonamide Derivatives     Current Outpatient Prescriptions  Medication Sig Dispense Refill  . aspirin 81 MG tablet Take 81 mg by mouth daily.        . carvedilol (COREG) 12.5 MG tablet Take 1 tablet (12.5 mg total) by mouth 2 (two) times daily with a meal.  60 tablet  6  . docusate sodium (COLACE) 100 MG capsule Take 100 mg by mouth 2 (two) times daily.        [name] doxazosin (CARDURA) 4 MG tablet Take 4 mg by mouth daily.        . finasteride (PROSCAR) 5 MG tablet Take 5 mg by mouth daily.        . furosemide (LASIX) 40 MG tablet Take 40 mg by mouth daily.        . Multiple Vitamin (MULTIVITAMIN) tablet Take 1 tablet by mouth daily.        . potassium chloride SA (K-DUR,KLOR-CON) 20 MEQ tablet Take 1 tablet (20 mEq total) by mouth daily.  90 tablet  3  . RABEprazole (ACIPHEX) 20 MG tablet Take 1 tablet (20 mg total) by mouth daily.  90 tablet  3  . ramipril (ALTACE) 10 MG capsule Take 1 capsule (10 mg total) by mouth daily.  90 capsule  2  . simvastatin (ZOCOR) 20 MG tablet Take 1 tablet (20 mg total) by mouth at bedtime.  90 tablet  3  . Wheat Dextrin (BENEFIBER) POWD Take by mouth daily.        . Zinc 50 MG TABS Take 50 mg by mouth daily.          History   Social History  . Marital Status: Married    Spouse Name: N/A    Number of Children: N/A  . Years of Education: N/A   Occupational History  . Not on file.   Social History Main Topics  . Smoking status: Former Smoker    Quit date: 02/08/1954  . Smokeless tobacco: Not on file  . Alcohol Use: No  . Drug Use: No  . Sexually Active: Not on file   Other Topics Concern  . Not on file   Social History Narrative  . No narrative on file    No family history on file.  Past Medical History   Diagnosis Date  . Hyperlipidemia   . LBBB (left bundle branch block)   . Depression   . COPD (chronic obstructive pulmonary disease)   . Calcium oxalate renal stones   . Dizziness   . GERD (gastroesophageal reflux disease)   . Diverticulosis   . IBS (irritable bowel syndrome)   . Leg cramps     not vascular in origin   . Liver cyst   . Wide-complex tachycardia     post op - on a beta blocker   . Sinus congestion   . Bleeding nose     cauteriszed dr Lazarus Salines  / cauterization repeated 10/11  . Coronary artery disease     Nuclear June 03, 2010, scar anterior wall apex and inferior wall, no definite ischemia,    not gated  . Intolerance of drug     Niaspan  . S/P aortic valve replacement with bioprosthetic valve  echo.. April, 2011... good valve function... mild AI  . S/P CABG (coronary artery bypass graft) January, 2007  . LV dysfunction     EF 45% / EF 25-30%, echo, April, 2011 / EF 25-30%, echo, April, 2012, akinesis of the inferior and posterior walls    Past Surgical History  Procedure Date  . Cholecystectomy   . Aortic valve replacement 02/2005  . Insert / replace / remove pacemaker   . Coronary artery bypass graft     x5  -- last one in 07  . Lithotripsy     ROS  Patient denies fever, chills, headache, sweats, rash, change in vision, change in hearing, chest pain, cough, nausea vomiting, urinary symptoms.  All the systems are reviewed and are negative.  PHYSICAL EXAM Patient is quite stable.  He is oriented to person time and place.  Affect is normal.  There is no jugular venous distention.  Lungs are clear.  Respiratory effort is nonlabored.  Cardiac exam reveals S1-S2.  There are no clicks or significant murmurs.  Abdomen is soft.  There is no peripheral edema. Filed Vitals:   12/23/10 1124  BP: 136/56  Pulse: 61  Resp: 20  Height: 5\' 8"  (1.727 m)  Weight: 154 lb (69.854 kg)    EKG is done today and reviewed by me.  There is dual chamber  pacing.  ASSESSMENT & PLAN

## 2010-12-23 NOTE — Assessment & Plan Note (Signed)
Coronary disease is stable.  No further workup is needed. 

## 2010-12-23 NOTE — Patient Instructions (Signed)
Your physician recommends that you schedule a follow-up appointment in: 6 months with Dr Myrtis Ser Your physician recommends that you schedule a follow-up appointment in: June with Dr Ladona Ridgel

## 2011-02-05 ENCOUNTER — Ambulatory Visit: Payer: Medicare Other | Admitting: Cardiology

## 2011-02-08 ENCOUNTER — Encounter: Payer: Medicare Other | Admitting: Cardiology

## 2011-02-11 ENCOUNTER — Encounter: Payer: Self-pay | Admitting: Cardiology

## 2011-02-11 ENCOUNTER — Ambulatory Visit (INDEPENDENT_AMBULATORY_CARE_PROVIDER_SITE_OTHER): Payer: Medicare Other | Admitting: Cardiology

## 2011-02-11 VITALS — BP 122/58 | HR 66 | Ht 67.0 in | Wt 148.0 lb

## 2011-02-11 DIAGNOSIS — I6529 Occlusion and stenosis of unspecified carotid artery: Secondary | ICD-10-CM

## 2011-02-11 DIAGNOSIS — Z951 Presence of aortocoronary bypass graft: Secondary | ICD-10-CM

## 2011-02-11 DIAGNOSIS — Z950 Presence of cardiac pacemaker: Secondary | ICD-10-CM

## 2011-02-11 DIAGNOSIS — I10 Essential (primary) hypertension: Secondary | ICD-10-CM

## 2011-02-11 DIAGNOSIS — I447 Left bundle-branch block, unspecified: Secondary | ICD-10-CM

## 2011-02-11 DIAGNOSIS — I2510 Atherosclerotic heart disease of native coronary artery without angina pectoris: Secondary | ICD-10-CM

## 2011-02-11 DIAGNOSIS — Z954 Presence of other heart-valve replacement: Secondary | ICD-10-CM

## 2011-02-11 DIAGNOSIS — I359 Nonrheumatic aortic valve disorder, unspecified: Secondary | ICD-10-CM

## 2011-02-11 DIAGNOSIS — I5023 Acute on chronic systolic (congestive) heart failure: Secondary | ICD-10-CM

## 2011-02-11 NOTE — Patient Instructions (Signed)
Your physician recommends that you schedule a follow-up appointment in 2 weeks with Dr. Myrtis Ser.  Your physician has requested that you have an echocardiogram in the next 2 weeks. Echocardiography is a painless test that uses sound waves to create images of your heart. It provides your doctor with information about the size and shape of your heart and how well your heart's chambers and valves are working. This procedure takes approximately one hour. There are no restrictions for this procedure.  Your physician recommends that you have the following lab work today:  BMET  Continue taking Lasix 80mg  by mouth daily.

## 2011-02-11 NOTE — Assessment & Plan Note (Signed)
At present, he is euvolemic and doing better. Will obtain electrolytes and check renal function on a higher dose of Lasix. Will continue with 80 mg of Lasix a day for now. I'll arrange  for a 2-D echocardiogram to see if there has been  any significant decrease in LV function. I will have Dr. Myrtis Ser see him back in the office within the next 2 weeks.

## 2011-02-11 NOTE — Progress Notes (Signed)
HPI [name] comes in today as an add-on for new onset congestive heart failure.  He saw his primary care doctor, Dr. Hyacinth Meeker, on December 19 with bronchitis. He returned on Christmas he was found to have congestive heart failure. At that time he reported having trouble laying down because of shortness of breath.  His Lasix was increased to 40 mg twice a day from 40 mg once a day. Since that time his weight is dropped 5 pounds.  Chest x-ray on Christmas he showed congestive heart failure. I do not have a copy of that report.  He now feels better but is still a little short of breath when he lies down.  He denied any chest pain or ischemic symptoms prior to this event.  He is a very complex cardiovascular history as outlined in the chart by Dr. Myrtis Ser. His ejection fraction was 25% in April of this year with inferior posterior akinesia. He is a prosthetic aortic valve which is been stable.  He is a chronic left bundle branch block. He has a history of wide-complex tachycardia.  His last nuclear study was April, 25,2012 showed scar of the anterior Bill Cunningham apex and inferior Bill Cunningham with no ischemia.  Past Medical History  Diagnosis Date  . Hyperlipidemia   . LBBB (left bundle branch block)   . Depression   . COPD (chronic obstructive pulmonary disease)   . Calcium oxalate renal stones   . Dizziness   . GERD (gastroesophageal reflux disease)   . Diverticulosis   . IBS (irritable bowel syndrome)   . Leg cramps     not vascular in origin   . Liver cyst   . Wide-complex tachycardia     post op - on a beta blocker   . Sinus congestion   . Bleeding nose     cauteriszed dr Lazarus Salines  / cauterization repeated 10/11  . Coronary artery disease     Nuclear June 03, 2010, scar anterior Bill Cunningham apex and inferior Bill Cunningham, no definite ischemia,    not gated  . Intolerance of drug     Niaspan  . S/P aortic valve replacement with bioprosthetic valve     echo.[name] April, 2011... good valve function... mild AI    . S/P CABG (coronary artery bypass graft) January, 2007  . LV dysfunction     EF 45% / EF 25-30%, echo, April, 2011 / EF 25-30%, echo, April, 2012, akinesis of the inferior and posterior walls    Current Outpatient Prescriptions  Medication Sig Dispense Refill  . aspirin 81 MG tablet Take 81 mg by mouth daily.        . carvedilol (COREG) 12.5 MG tablet Take 1 tablet (12.5 mg total) by mouth 2 (two) times daily with a meal.  60 tablet  6  . docusate sodium (COLACE) 100 MG capsule Take 100 mg by mouth 2 (two) times daily.        [name] doxazosin (CARDURA) 4 MG tablet Take 4 mg by mouth daily.        . finasteride (PROSCAR) 5 MG tablet Take 5 mg by mouth daily.        . furosemide (LASIX) 80 MG tablet Take 1 tablet (80 mg total) by mouth daily.  30 tablet  11  . Multiple Vitamin (MULTIVITAMIN) tablet Take 1 tablet by mouth daily.        . potassium chloride SA (K-DUR,KLOR-CON) 20 MEQ tablet Take 1 tablet (20 mEq total) by mouth daily.  90 tablet  3  . RABEprazole (ACIPHEX) 20 MG tablet Take 1 tablet (20 mg total) by mouth daily.  90 tablet  3  . ramipril (ALTACE) 10 MG capsule Take 1 capsule (10 mg total) by mouth daily.  90 capsule  2  . simvastatin (ZOCOR) 20 MG tablet Take 1 tablet (20 mg total) by mouth at bedtime.  90 tablet  3  . Wheat Dextrin (BENEFIBER) POWD Take by mouth daily.        . Zinc 50 MG TABS Take 50 mg by mouth daily.          Allergies  Allergen Reactions  . Niaspan (Niacin (Antihyperlipidemic))   . Sulfonamide Derivatives     No family history on file.  History   Social History  . Marital Status: Married    Spouse Name: N/A    Number of Children: N/A  . Years of Education: N/A   Occupational History  . Not on file.   Social History Main Topics  . Smoking status: Former Smoker    Quit date: 02/08/1954  . Smokeless tobacco: Not on file  . Alcohol Use: No  . Drug Use: No  . Sexually Active: Not on file   Other Topics Concern  . Not on file   Social  History Narrative  . No narrative on file    ROS ALL NEGATIVE EXCEPT THOSE NOTED IN HPI  PE  General Appearance: well developed, well nourished in no acute distress, frail, not dyspneic HEENT: symmetrical face, PERRLA, good dentition  Neck: no JVD, thyromegaly, or adenopathy, trachea midline Chest: symmetric without deformity Cardiac: PMI non-displaced, RRR, normal S1, S2, soft systolic murmur left lower sternal border Lung: clear to ausculation and percussion Vascular: all pulses full without bruits  Abdominal: nondistended, nontender, good bowel sounds, no HSM, no bruits Extremities: no cyanosis, clubbing or edema, no sign of DVT, no varicosities  Skin: normal color, no rashes Neuro: alert and oriented x 3, non-focal Pysch: normal affect  EKG Not repeated, has a left bundle branch block chronically. BMET No results found for this basename: na, k, cl, co2, glucose, bun, creatinine, calcium, gfrnonaa, gfraa    Lipid Panel     Component Value Date/Time   CHOL 121 06/03/2010 1012   TRIG 56.0 06/03/2010 1012   HDL 34.90* 06/03/2010 1012   CHOLHDL 3 06/03/2010 1012   VLDL 11.2 06/03/2010 1012   LDLCALC 75 06/03/2010 1012    CBC    Component Value Date/Time   WBC 9.2 12/04/2009 1306   RBC 3.58* 12/04/2009 1306   HGB 10.7* 12/04/2009 1306   HCT 31.7* 12/04/2009 1306   PLT 247.0 12/04/2009 1306   MCV 88.7 12/04/2009 1306   MCHC 33.7 12/04/2009 1306   RDW 13.0 12/04/2009 1306   LYMPHSABS 1.8 12/04/2009 1306   MONOABS 0.8 12/04/2009 1306   EOSABS 0.1 12/04/2009 1306   BASOSABS 0.1 12/04/2009 1306

## 2011-02-12 LAB — BASIC METABOLIC PANEL
BUN: 20 mg/dL (ref 6–23)
Calcium: 8.8 mg/dL (ref 8.4–10.5)
GFR: 60.26 mL/min (ref >60.00)
Glucose, Bld: 111 mg/dL — ABNORMAL HIGH (ref 70–99)
Sodium: 128 mEq/L — ABNORMAL LOW (ref 135–145)

## 2011-02-18 ENCOUNTER — Other Ambulatory Visit (HOSPITAL_COMMUNITY): Payer: Medicare Other

## 2011-02-18 ENCOUNTER — Ambulatory Visit (HOSPITAL_COMMUNITY): Payer: Medicare Other | Attending: Cardiology | Admitting: Radiology

## 2011-02-18 DIAGNOSIS — I5023 Acute on chronic systolic (congestive) heart failure: Secondary | ICD-10-CM

## 2011-02-18 DIAGNOSIS — J4489 Other specified chronic obstructive pulmonary disease: Secondary | ICD-10-CM | POA: Insufficient documentation

## 2011-02-18 DIAGNOSIS — I509 Heart failure, unspecified: Secondary | ICD-10-CM | POA: Insufficient documentation

## 2011-02-18 DIAGNOSIS — E785 Hyperlipidemia, unspecified: Secondary | ICD-10-CM | POA: Insufficient documentation

## 2011-02-18 DIAGNOSIS — J449 Chronic obstructive pulmonary disease, unspecified: Secondary | ICD-10-CM | POA: Insufficient documentation

## 2011-02-19 ENCOUNTER — Encounter: Payer: Self-pay | Admitting: Internal Medicine

## 2011-02-19 DIAGNOSIS — I442 Atrioventricular block, complete: Secondary | ICD-10-CM

## 2011-02-25 ENCOUNTER — Encounter: Payer: Self-pay | Admitting: Physician Assistant

## 2011-02-25 ENCOUNTER — Ambulatory Visit (INDEPENDENT_AMBULATORY_CARE_PROVIDER_SITE_OTHER): Payer: Medicare Other | Admitting: Physician Assistant

## 2011-02-25 VITALS — BP 138/54 | HR 60 | Resp 18 | Ht 67.0 in | Wt 149.8 lb

## 2011-02-25 DIAGNOSIS — I2589 Other forms of chronic ischemic heart disease: Secondary | ICD-10-CM

## 2011-02-25 DIAGNOSIS — I5022 Chronic systolic (congestive) heart failure: Secondary | ICD-10-CM

## 2011-02-25 DIAGNOSIS — I2510 Atherosclerotic heart disease of native coronary artery without angina pectoris: Secondary | ICD-10-CM

## 2011-02-25 DIAGNOSIS — I6529 Occlusion and stenosis of unspecified carotid artery: Secondary | ICD-10-CM

## 2011-02-25 DIAGNOSIS — I10 Essential (primary) hypertension: Secondary | ICD-10-CM

## 2011-02-25 DIAGNOSIS — Z954 Presence of other heart-valve replacement: Secondary | ICD-10-CM

## 2011-02-25 MED ORDER — FUROSEMIDE 40 MG PO TABS: 40.0000 mg | ORAL_TABLET | Freq: Every day | ORAL | Status: DC

## 2011-02-25 NOTE — Assessment & Plan Note (Signed)
He had to cancel dopplers.  Will reschedule.

## 2011-02-25 NOTE — Assessment & Plan Note (Signed)
Controlled.  Continue current therapy.  

## 2011-02-25 NOTE — Assessment & Plan Note (Signed)
EF improved on carvedilol and ramipril.  Dr. Zackery Barefoot has deferred discussing ICD in past due to advanced age.  Now EF 35-40%.  Follow up as planned.

## 2011-02-25 NOTE — Assessment & Plan Note (Signed)
Recent echo with normal functioning AVR.

## 2011-02-25 NOTE — Assessment & Plan Note (Signed)
No angina.  Continue ASA.  Myoview in 4/12 was neg for ischemia.

## 2011-02-25 NOTE — Assessment & Plan Note (Signed)
Likely exacerbated by acute illness.  He is back to baseline now with stable weights.  EF actually improved by recent echo.  Of note, carvedilol added some time last year.  Follow up with Dr. Zackery Barefoot in 4-6 weeks.  At this point, no further workup appears necessary.  Repeat BMET today.

## 2011-02-25 NOTE — Progress Notes (Signed)
[address] [place], [place]  [address] Phone: [phone] Fax:  [phone]  Date:  02/25/2011   Name:  Bill Cunningham       DOB:  [birth year] MRN:  [id number]  PCP:  [name]  Primary Cardiologist:  Dr. Zackery Barefoot   Primary Electrophysiologist:  Dr. Lewayne Bunting    History of Present Illness: Bill Cunningham is a 78 y.o. male who presents for follow up.  He has a history of complete heart block status post permanent pacemaker insertion, coronary artery disease status post bypass surgery, aortic stenosis status post valve replacement, hypertension, dyslipidemia, and systolic heart failure.  CABG and aVR were performed 1/07.  Last Myoview 4/12: Distal anterior, apical and inferior scar, no ischemia.  Last echocardiogram 4/11: EF 25-30%, grade 1 diastolic dysfunction, mild AI, mild MR, mild LAE.  He was seen by Dr. Daleen Squibb 1/3 for a/c systolic CHF.  He recently had bronchitis and had seen his PCP who referred him here for evaluation.  Chest x-ray demonstrated CHF.  His Lasix dose was increased.  He felt much better when he saw Dr. Daleen Squibb and his weight was down.  Labs were obtained: Sodium 128, potassium 4.8, BUN 20, creatinine 1.2.  At that point, his Lasix was decreased back to 40 mg daily.  Follow up echo 02/18/11 demonstrated improved LVF: EF 35-40%, mid to distal anteroseptal hypokinesis, grade 1 diastolic dysfunction, AVR okay, mild left ear, mean gradient 12 mm of mercury, mild MR, mild LAE.  Doing well.  Notes he feels better.  Describes class 2 symptoms.  The patient denies chest pain, shortness of breath, syncope, orthopnea, PND or significant pedal edema.   Past Medical History  Diagnosis Date  . Hyperlipidemia   . LBBB (left bundle branch block)   . Depression   . COPD (chronic obstructive pulmonary disease)   . Calcium oxalate renal stones   . Dizziness   . GERD (gastroesophageal reflux disease)   . Diverticulosis   . IBS (irritable bowel syndrome)    . Leg cramps     not vascular in origin   . Liver cyst   . Wide-complex tachycardia     post op - on a beta blocker   . Sinus congestion   . Bleeding nose     cauteriszed dr Lazarus Salines  / cauterization repeated 10/11  . Coronary artery disease     Nuclear June 03, 2010, scar anterior wall apex and inferior wall, no definite ischemia,    not gated  . Intolerance of drug     Niaspan  . S/P aortic valve replacement with bioprosthetic valve     echo.[name] April, 2011... good valve function... mild AI  . S/P CABG (coronary artery bypass graft) January, 2007  . LV dysfunction     EF 45% / EF 25-30%, echo, April, 2011 / EF 25-30%, echo, April, 2012, akinesis of the inferior and posterior walls    Current Outpatient Prescriptions  Medication Sig Dispense Refill  . aspirin 81 MG tablet Take 81 mg by mouth daily.        . carvedilol (COREG) 12.5 MG tablet Take 1 tablet (12.5 mg total) by mouth 2 (two) times daily with a meal.  60 tablet  6  . docusate sodium (COLACE) 100 MG capsule Take 100 mg by mouth 2 (two) times daily.        [name] doxazosin (CARDURA) 4 MG tablet Take 4 mg by mouth daily.        [name]  finasteride (PROSCAR) 5 MG tablet Take 5 mg by mouth daily.        . furosemide (LASIX) 80 MG tablet Take 1 tablet (80 mg total) by mouth daily.  30 tablet  11  . Multiple Vitamin (MULTIVITAMIN) tablet Take 1 tablet by mouth daily.        . potassium chloride SA (K-DUR,KLOR-CON) 20 MEQ tablet Take 1 tablet (20 mEq total) by mouth daily.  90 tablet  3  . RABEprazole (ACIPHEX) 20 MG tablet Take 1 tablet (20 mg total) by mouth daily.  90 tablet  3  . ramipril (ALTACE) 10 MG capsule Take 1 capsule (10 mg total) by mouth daily.  90 capsule  2  . simvastatin (ZOCOR) 20 MG tablet Take 1 tablet (20 mg total) by mouth at bedtime.  90 tablet  3  . Wheat Dextrin (BENEFIBER) POWD Take by mouth daily.        . Zinc 50 MG TABS Take 50 mg by mouth daily.          Allergies: Allergies  Allergen Reactions  .  Niaspan (Niacin (Antihyperlipidemic))   . Sulfonamide Derivatives     History  Substance Use Topics  . Smoking status: Former Smoker    Quit date: 02/08/1954  . Smokeless tobacco: Not on file  . Alcohol Use: No     ROS:  Please see the history of present illness.   All other systems reviewed and negative.   PHYSICAL EXAM: VS:  BP 138/54  Pulse 60  Resp 18  Ht 5\' 7"  (1.702 m)  Wt 149 lb 12.8 oz (67.949 kg)  BMI 23.46 kg/m2 Well nourished, well developed, in no acute distress HEENT: normal Neck: no JVD Cardiac:  normal S1, S2; RRR; 1/6 systolic murmur at RUSB Lungs:  clear to auscultation bilaterally, no wheezing, rhonchi or rales Abd: soft, nontender, no hepatomegaly Ext: no edema Skin: warm and dry Neuro:  CNs 2-12 intact, no focal abnormalities noted  EKG:   AV paced, heart rate 60  ASSESSMENT AND PLAN:

## 2011-02-25 NOTE — Patient Instructions (Signed)
Your physician recommends that you schedule a follow-up appointment in: 4-6 weeks with Dr Myrtis Ser Your physician recommends that you return for lab work in: today [facility]) Your physician has requested that you have a carotid duplex. This test is an ultrasound of the carotid arteries in your neck. It looks at blood flow through these arteries that supply the brain with blood. Allow one hour for this exam. There are no restrictions or special instructions. ([place] [facility])

## 2011-02-26 LAB — BASIC METABOLIC PANEL
CO2: 28 mEq/L (ref 19–32)
Calcium: 8.9 mg/dL (ref 8.4–10.5)
Creatinine, Ser: 1.1 mg/dL (ref 0.4–1.5)
GFR: 65.88 mL/min (ref >60.00)
Glucose, Bld: 102 mg/dL — ABNORMAL HIGH (ref 70–99)

## 2011-03-01 ENCOUNTER — Other Ambulatory Visit: Payer: Self-pay | Admitting: *Deleted

## 2011-03-01 ENCOUNTER — Other Ambulatory Visit: Payer: Self-pay | Admitting: Cardiology

## 2011-03-01 DIAGNOSIS — E871 Hypo-osmolality and hyponatremia: Secondary | ICD-10-CM

## 2011-03-01 NOTE — Telephone Encounter (Signed)
I confirmed the instructions from [facility] PA-C

## 2011-03-01 NOTE — Telephone Encounter (Signed)
New Problem   Patient is returning a call from someone named lynn.  Patient request a call from Dr. Myrtis Ser Nurse as he doesn't understand what they said and why they call.  He can be reached at hm#

## 2011-03-03 ENCOUNTER — Telehealth: Payer: Self-pay | Admitting: Cardiology

## 2011-03-04 ENCOUNTER — Other Ambulatory Visit: Payer: Self-pay

## 2011-03-04 ENCOUNTER — Telehealth: Payer: Self-pay | Admitting: Cardiology

## 2011-03-04 MED ORDER — FUROSEMIDE 40 MG PO TABS: 40.0000 mg | ORAL_TABLET | Freq: Every day | ORAL | Status: DC

## 2011-03-04 MED ORDER — CARVEDILOL 12.5 MG PO TABS: 12.5000 mg | ORAL_TABLET | Freq: Two times a day (BID) | ORAL | Status: DC

## 2011-03-04 NOTE — Telephone Encounter (Signed)
Requesting refill of carvedilol which was sent to [facility] per pt request.

## 2011-03-04 NOTE — Telephone Encounter (Signed)
Error

## 2011-03-04 NOTE — Telephone Encounter (Signed)
New problem Pt called about his meds. He has some questions

## 2011-03-05 ENCOUNTER — Telehealth: Payer: Self-pay | Admitting: Gastroenterology

## 2011-03-08 NOTE — Telephone Encounter (Signed)
aciphex approved for drug and qty case number drug: [id number] qty: [id number]

## 2011-03-16 ENCOUNTER — Other Ambulatory Visit (INDEPENDENT_AMBULATORY_CARE_PROVIDER_SITE_OTHER): Payer: Medicare Other | Admitting: *Deleted

## 2011-03-16 ENCOUNTER — Other Ambulatory Visit: Payer: Self-pay | Admitting: *Deleted

## 2011-03-16 DIAGNOSIS — E871 Hypo-osmolality and hyponatremia: Secondary | ICD-10-CM

## 2011-03-16 LAB — BASIC METABOLIC PANEL
BUN: 16 mg/dL (ref 6–23)
Calcium: 9.2 mg/dL (ref 8.4–10.5)
Creatinine, Ser: 1.1 mg/dL (ref 0.4–1.5)
GFR: 67.96 mL/min (ref >60.00)

## 2011-03-16 MED ORDER — FUROSEMIDE 40 MG PO TABS: 40.0000 mg | ORAL_TABLET | Freq: Every day | ORAL | Status: DC

## 2011-03-16 MED ORDER — CARVEDILOL 12.5 MG PO TABS: 12.5000 mg | ORAL_TABLET | Freq: Two times a day (BID) | ORAL | Status: DC

## 2011-03-16 MED ORDER — RAMIPRIL 10 MG PO CAPS: 10.0000 mg | ORAL_CAPSULE | Freq: Every day | ORAL | Status: DC

## 2011-03-16 NOTE — Telephone Encounter (Signed)
PT WALKED IN TO DISCUSS MEDS, CORRECTIONS MADE, PT ACCEPTING OF PLAN.

## 2011-03-29 ENCOUNTER — Encounter: Payer: Self-pay | Admitting: Cardiology

## 2011-03-30 ENCOUNTER — Encounter: Payer: Self-pay | Admitting: Cardiology

## 2011-03-30 ENCOUNTER — Ambulatory Visit (INDEPENDENT_AMBULATORY_CARE_PROVIDER_SITE_OTHER): Payer: Medicare Other | Admitting: Cardiology

## 2011-03-30 DIAGNOSIS — I2589 Other forms of chronic ischemic heart disease: Secondary | ICD-10-CM

## 2011-03-30 DIAGNOSIS — I5022 Chronic systolic (congestive) heart failure: Secondary | ICD-10-CM

## 2011-03-30 DIAGNOSIS — I6529 Occlusion and stenosis of unspecified carotid artery: Secondary | ICD-10-CM

## 2011-03-30 DIAGNOSIS — I255 Ischemic cardiomyopathy: Secondary | ICD-10-CM

## 2011-03-30 NOTE — Assessment & Plan Note (Signed)
He had a flare of mild volume overload related to having an episode of bronchitis. This is now treated and is completely stable.

## 2011-03-30 NOTE — Assessment & Plan Note (Signed)
The echo in January, 2013 suggested that the patient had had some improvement in his LV function since April. We're certainly pleased with this. He is on optimal medications that he can tolerate for his LV dysfunction.

## 2011-03-30 NOTE — Progress Notes (Signed)
HPI Patient is seen today to followup an episode of congestive heart failure. We have been very carefully adjusting his medications for his LV dysfunction. He had been stable. At the time of his last echo he had not had marked improvement in his LV function. He had bronchitis and was then seen in our office on 2 occasions. It was felt that he did have some evidence of congestive heart failure by physical exam and chest x-ray. His diuretic dose was increased. He diuresis area lab showed that he had slight decrease in his sodium but his potassium remained normal. He had a followup echo showing that his EF seemed somewhat better in the range of 35-40%. He was then seen last in the office on February 25, 2011. He was improved. Repeat chemistry revealed a sodium of 136 potassium 5.1 BUN 16 and creatinine 1.1. His blood pressure was controlled and he appeared to be stable. Since that time he's been doing well. Retrospectively it appears that his episode of bronchitis probably caused him to have some congestive heart failure which is now clinically improved.  Allergies  Allergen Reactions  . Niaspan (Niacin (Antihyperlipidemic))   . Sulfonamide Derivatives     Current Outpatient Prescriptions  Medication Sig Dispense Refill  . aspirin 81 MG tablet Take 81 mg by mouth daily.        . carvedilol (COREG) 12.5 MG tablet Take 1 tablet (12.5 mg total) by mouth 2 (two) times daily with a meal.  180 tablet  1  . docusate sodium (COLACE) 100 MG capsule Take 100 mg by mouth 2 (two) times daily.        [name] doxazosin (CARDURA) 4 MG tablet Take 4 mg by mouth daily.        . finasteride (PROSCAR) 5 MG tablet Take 5 mg by mouth daily.        . furosemide (LASIX) 40 MG tablet Take 1 tablet (40 mg total) by mouth daily.  90 tablet  3  . Multiple Vitamin (MULTIVITAMIN) tablet Take 1 tablet by mouth daily.        . potassium chloride SA (K-DUR,KLOR-CON) 20 MEQ tablet Take 1 tablet (20 mEq total) by mouth daily.  90 tablet  3    . RABEprazole (ACIPHEX) 20 MG tablet Take 1 tablet (20 mg total) by mouth daily.  90 tablet  3  . ramipril (ALTACE) 10 MG capsule Take 1 capsule (10 mg total) by mouth daily.  30 capsule  1  . simvastatin (ZOCOR) 20 MG tablet Take 1 tablet (20 mg total) by mouth at bedtime.  90 tablet  3  . Wheat Dextrin (BENEFIBER) POWD Take by mouth daily.        . Zinc 50 MG TABS Take 50 mg by mouth daily.          History   Social History  . Marital Status: Married    Spouse Name: N/A    Number of Children: N/A  . Years of Education: N/A   Occupational History  . Not on file.   Social History Main Topics  . Smoking status: Former Smoker    Quit date: 02/08/1954  . Smokeless tobacco: Not on file  . Alcohol Use: No  . Drug Use: No  . Sexually Active: Not on file   Other Topics Concern  . Not on file   Social History Narrative  . No narrative on file    No family history on file.  Past Medical History  Diagnosis  Date  . Hyperlipidemia   . LBBB (left bundle branch block)   . Depression   . COPD (chronic obstructive pulmonary disease)   . Calcium oxalate renal stones   . Dizziness   . GERD (gastroesophageal reflux disease)   . Diverticulosis   . IBS (irritable bowel syndrome)   . Leg cramps     not vascular in origin   . Liver cyst   . Wide-complex tachycardia     post op - on a beta blocker   . Sinus congestion   . Bleeding nose     cauteriszed dr Lazarus Salines  / cauterization repeated 10/11  . Coronary artery disease     Nuclear June 03, 2010, scar anterior wall apex and inferior wall, no definite ischemia,    not gated  . Intolerance of drug     Niaspan  . S/P aortic valve replacement with bioprosthetic valve     echo.[name] April, 2011... good valve function... mild AI  . S/P CABG (coronary artery bypass graft) January, 2007  . LV dysfunction     EF 45% / EF 25-30%, echo, April, 2011 / EF 25-30%, echo, April, 2012, akinesis of the inferior and posterior walls  . Ischemic  cardiomyopathy     EF followed carefully    Past Surgical History  Procedure Date  . Cholecystectomy   . Aortic valve replacement 02/2005  . Insert / replace / remove pacemaker   . Coronary artery bypass graft     x5  -- last one in 07  . Lithotripsy     ROS  Patient denies fever, chills, headache, sweats, rash, change in vision, change in hearing, chest pain, cough, nausea vomiting,. He does have chronic recurring urinary tract infections. This is not a clinical problem as of today.  PHYSICAL EXAM Patient is oriented to person time and place. Affect is normal.  There is no jugular venous distention. Lungs are clear. Respiratory effort is nonlabored. Cardiac exam reveals S1 and S2. There is a soft systolic murmur. The abdomen is soft. There is no peripheral edema. Filed Vitals:   03/30/11 1145  BP: 118/52  Pulse: 72  Resp: 20  Height: 5\' 7"  (1.702 m)  Weight: 149 lb (67.586 kg)    ASSESSMENT & PLAN

## 2011-03-30 NOTE — Patient Instructions (Addendum)
Your physician has requested that you have a carotid duplex in late March or early April. This test is an ultrasound of the carotid arteries in your neck. It looks at blood flow through these arteries that supply the brain with blood. Allow one hour for this exam. There are no restrictions or special instructions.  Your physician wants you to follow-up in: 6 months.   You will receive a reminder letter in the mail two months in advance. If you don't receive a letter, please call our office to schedule the follow-up appointment.

## 2011-03-30 NOTE — Assessment & Plan Note (Signed)
We will arrange for the patient to have his carotid Doppler at a later date. He prefers not to electively come out of the cold weather if possible.

## 2011-04-26 ENCOUNTER — Encounter (INDEPENDENT_AMBULATORY_CARE_PROVIDER_SITE_OTHER): Payer: Medicare Other

## 2011-04-26 DIAGNOSIS — I6529 Occlusion and stenosis of unspecified carotid artery: Secondary | ICD-10-CM

## 2011-05-03 ENCOUNTER — Encounter: Payer: Self-pay | Admitting: Cardiology

## 2011-05-03 DIAGNOSIS — I739 Peripheral vascular disease, unspecified: Secondary | ICD-10-CM

## 2011-05-21 ENCOUNTER — Encounter: Payer: Self-pay | Admitting: Internal Medicine

## 2011-05-21 DIAGNOSIS — I442 Atrioventricular block, complete: Secondary | ICD-10-CM

## 2011-05-26 ENCOUNTER — Telehealth: Payer: Self-pay | Admitting: Internal Medicine

## 2011-05-26 NOTE — Telephone Encounter (Deleted)
Pt in the other day, and the nurse christine was to check on a med and call him back yesterday and he didn't hear from her, pls call [phone]

## 2011-06-02 ENCOUNTER — Encounter: Payer: Self-pay | Admitting: Internal Medicine

## 2011-06-02 ENCOUNTER — Ambulatory Visit (INDEPENDENT_AMBULATORY_CARE_PROVIDER_SITE_OTHER): Payer: Medicare Other | Admitting: *Deleted

## 2011-06-02 DIAGNOSIS — I442 Atrioventricular block, complete: Secondary | ICD-10-CM

## 2011-06-02 LAB — PACEMAKER DEVICE OBSERVATION
AL AMPLITUDE: 1 mv
AL IMPEDENCE PM: 430 Ohm
AL THRESHOLD: 1 V
BATTERY VOLTAGE: 2.78 V
RV LEAD AMPLITUDE: 10 mv
VENTRICULAR PACING PM: 92

## 2011-06-02 NOTE — Progress Notes (Signed)
PPM check 

## 2011-06-09 ENCOUNTER — Telehealth: Payer: Self-pay | Admitting: Gastroenterology

## 2011-06-09 NOTE — Telephone Encounter (Signed)
Advised pt to call his insurance and see what PPI is more cost effective for him and call me back and We will call him in that drug.

## 2011-06-10 ENCOUNTER — Other Ambulatory Visit: Payer: Self-pay | Admitting: Gastroenterology

## 2011-06-10 MED ORDER — OMEPRAZOLE 20 MG PO CPDR: 20.0000 mg | DELAYED_RELEASE_CAPSULE | Freq: Every day | ORAL | Status: DC

## 2011-06-10 NOTE — Telephone Encounter (Signed)
Patient states he was supposed to find out what his preferred generic medication were to replace his Aciphex which is too expensive. Pt states that we have to call to get a PA on any of these medications also. Told patient that we can start with omeprazole and if I get a PA from the pharmacy then I will his insurance company and do a PA on that medication. Pt agreed and prescription sent to the pharmacy.

## 2011-08-10 ENCOUNTER — Other Ambulatory Visit: Payer: Self-pay | Admitting: Cardiology

## 2011-08-10 DIAGNOSIS — E785 Hyperlipidemia, unspecified: Secondary | ICD-10-CM

## 2011-08-10 MED ORDER — RAMIPRIL 10 MG PO CAPS: 10.0000 mg | ORAL_CAPSULE | Freq: Every day | ORAL | Status: DC

## 2011-08-10 MED ORDER — SIMVASTATIN 20 MG PO TABS: 20.0000 mg | ORAL_TABLET | Freq: Every day | ORAL | Status: DC

## 2011-08-10 NOTE — Telephone Encounter (Signed)
Pt requesting [facility] day supply [facility],  pls call pt when done [phone]

## 2011-08-13 ENCOUNTER — Other Ambulatory Visit: Payer: Self-pay | Admitting: Cardiology

## 2011-08-13 MED ORDER — RAMIPRIL 10 MG PO CAPS: 10.0000 mg | ORAL_CAPSULE | Freq: Every day | ORAL | Status: DC

## 2011-08-13 NOTE — Telephone Encounter (Signed)
Fax Received. Refill Completed. Bill Cunningham (R.M.A)   

## 2011-08-13 NOTE — Telephone Encounter (Signed)
[facility]   * patient would like a call to confirm that this is done

## 2011-08-17 ENCOUNTER — Ambulatory Visit (INDEPENDENT_AMBULATORY_CARE_PROVIDER_SITE_OTHER): Payer: Medicare Other | Admitting: Internal Medicine

## 2011-08-17 ENCOUNTER — Encounter: Payer: Self-pay | Admitting: Internal Medicine

## 2011-08-17 VITALS — BP 126/60 | HR 64 | Ht 67.0 in | Wt 148.0 lb

## 2011-08-17 DIAGNOSIS — E785 Hyperlipidemia, unspecified: Secondary | ICD-10-CM

## 2011-08-17 DIAGNOSIS — I5022 Chronic systolic (congestive) heart failure: Secondary | ICD-10-CM

## 2011-08-17 DIAGNOSIS — Z950 Presence of cardiac pacemaker: Secondary | ICD-10-CM

## 2011-08-17 DIAGNOSIS — I2510 Atherosclerotic heart disease of native coronary artery without angina pectoris: Secondary | ICD-10-CM

## 2011-08-17 LAB — PACEMAKER DEVICE OBSERVATION
BAMS-0003: 70 {beats}/min
BATTERY VOLTAGE: 2.79 V
DEVICE MODEL PM: 1609308
RV LEAD THRESHOLD: 0.75 V
VENTRICULAR PACING PM: 77

## 2011-08-17 MED ORDER — POTASSIUM CHLORIDE CRYS ER 20 MEQ PO TBCR: 20.0000 meq | EXTENDED_RELEASE_TABLET | Freq: Every day | ORAL | Status: DC

## 2011-08-17 NOTE — Assessment & Plan Note (Signed)
His pacemaker is working normally. We'll plan to recheck in several months. He has underlying complete heart block.

## 2011-08-17 NOTE — Assessment & Plan Note (Signed)
He denies anginal symptoms. He will continue his current medical therapy. 

## 2011-08-17 NOTE — Progress Notes (Signed)
HPI [name] returns today for followup. He is a very pleasant 78 year old man with complete heart block status post pacemaker insertion, an ischemic cardiomyopathy, chronic systolic heart failure class II who's had one episode of worsening heart failure in the interim. He was treated as an outpatient. he denies chest pain. No syncope.  Allergies  Allergen Reactions  . Niaspan (Niacin Er)   . Sulfonamide Derivatives      Current Outpatient Prescriptions  Medication Sig Dispense Refill  . aspirin 81 MG tablet Take 81 mg by mouth daily.        . carvedilol (COREG) 12.5 MG tablet Take 1 tablet (12.5 mg total) by mouth 2 (two) times daily with a meal.  180 tablet  1  . cephALEXin (KEFLEX) 250 MG capsule Take 250 mg by mouth daily.      [name] docusate sodium (COLACE) 100 MG capsule Take 100 mg by mouth 2 (two) times daily.        [name] doxazosin (CARDURA) 4 MG tablet Take 4 mg by mouth daily.        . finasteride (PROSCAR) 5 MG tablet Take 5 mg by mouth daily.        . furosemide (LASIX) 40 MG tablet Take 1 tablet (40 mg total) by mouth daily.  90 tablet  3  . Multiple Vitamin (MULTIVITAMIN) tablet Take 1 tablet by mouth daily.        [name] omeprazole (PRILOSEC) 20 MG capsule Take 1 capsule (20 mg total) by mouth daily.  30 capsule  1  . potassium chloride SA (K-DUR,KLOR-CON) 20 MEQ tablet Take 1 tablet (20 mEq total) by mouth daily.  90 tablet  3  . ramipril (ALTACE) 10 MG capsule Take 1 capsule (10 mg total) by mouth daily.  90 capsule  3  . simvastatin (ZOCOR) 20 MG tablet Take 1 tablet (20 mg total) by mouth at bedtime.  90 tablet  0  . Wheat Dextrin (BENEFIBER) POWD Take by mouth daily.        . Zinc 50 MG TABS Take 50 mg by mouth daily.        [name] DISCONTD: potassium chloride SA (K-DUR,KLOR-CON) 20 MEQ tablet Take 1 tablet (20 mEq total) by mouth daily.  90 tablet  3     Past Medical History  Diagnosis Date  . Hyperlipidemia   . LBBB (left bundle branch block)   . Depression   . COPD (chronic  obstructive pulmonary disease)   . Calcium oxalate renal stones   . Dizziness   . GERD (gastroesophageal reflux disease)   . Diverticulosis   . IBS (irritable bowel syndrome)   . Leg cramps     not vascular in origin   . Liver cyst   . Wide-complex tachycardia     post op - on a beta blocker   . Sinus congestion   . Bleeding nose     cauteriszed dr Lazarus Salines  / cauterization repeated 10/11  . Coronary artery disease     Nuclear June 03, 2010, scar anterior wall apex and inferior wall, no definite ischemia,    not gated  . Intolerance of drug     Niaspan  . S/P aortic valve replacement with bioprosthetic valve     echo.[name] April, 2011... good valve function... mild AI  . S/P CABG (coronary artery bypass graft) January, 2007  . LV dysfunction     EF 45% / EF 25-30%, echo, April, 2011 / EF 25-30%, echo, April, 2012, akinesis  of the inferior and posterior walls  . Ischemic cardiomyopathy     EF followed carefully  . Carotid artery disease     Doppler, 2011, bilateral 40-59%    /   Doppler, March, 2013, no change, bilateral 40-59%    ROS:   All systems reviewed and negative except as noted in the HPI.   Past Surgical History  Procedure Date  . Cholecystectomy   . Aortic valve replacement 02/2005  . Insert / replace / remove pacemaker   . Coronary artery bypass graft     x5  -- last one in 07  . Lithotripsy      No family history on file.   History   Social History  . Marital Status: Married    Spouse Name: N/A    Number of Children: N/A  . Years of Education: N/A   Occupational History  . Not on file.   Social History Main Topics  . Smoking status: Former Smoker    Quit date: 02/08/1954  . Smokeless tobacco: Not on file  . Alcohol Use: No  . Drug Use: No  . Sexually Active: Not on file   Other Topics Concern  . Not on file   Social History Narrative  . No narrative on file     BP 126/60  Pulse 64  Ht 5\' 7"  (1.702 m)  Wt 148 lb (67.132 kg)  BMI  23.18 kg/m2  Physical Exam:  Well appearing totally man, NAD HEENT: Unremarkable Neck:  7 cm  JVD, no thyromegally Lungs:  Clear with no wheezes, rales, or rhonchi. Well-healed pacemaker incision. HEART:  Regular rate rhythm, no murmurs, no rubs, no clicks Abd:  soft, positive bowel sounds, no organomegally, no rebound, no guarding Ext:  2 plus pulses, no edema, no cyanosis, no clubbing Skin:  No rashes no nodules Neuro:  CN II through XII intact, motor grossly intact  DEVICE  Normal device function.  See PaceArt for details.   Assess/Plan:

## 2011-08-17 NOTE — Assessment & Plan Note (Signed)
His symptoms are currently class II. I've recommended that he maintain a low-sodium diet. I did not take his heart failure symptoms warrant upgrade to a biventricular device at this time. However if his symptoms were to worsen, a biventricular pacemaker would be reasonable to consider.

## 2011-08-30 ENCOUNTER — Other Ambulatory Visit: Payer: Self-pay | Admitting: Cardiology

## 2011-08-30 DIAGNOSIS — E785 Hyperlipidemia, unspecified: Secondary | ICD-10-CM

## 2011-08-30 MED ORDER — RAMIPRIL 10 MG PO CAPS: 10.0000 mg | ORAL_CAPSULE | Freq: Every day | ORAL | Status: DC

## 2011-08-30 MED ORDER — SIMVASTATIN 20 MG PO TABS: 20.0000 mg | ORAL_TABLET | Freq: Every day | ORAL | Status: DC

## 2011-08-30 NOTE — Telephone Encounter (Signed)
Refill f/u - Simvistatin    Medication [facility], [facility]         ramipril (ALTACE) 10 MG capsule- pt also ordered this medication [facility] never received medication.   Please call patient at hm# to discuss and advice.

## 2011-08-30 NOTE — Telephone Encounter (Signed)
Refilled simvastatin and ramipril , also advised patient

## 2011-08-30 NOTE — Telephone Encounter (Signed)
Patient adds he needs new prescriptions RX for these medications!!!

## 2011-09-02 ENCOUNTER — Telehealth: Payer: Self-pay | Admitting: Gastroenterology

## 2011-09-02 ENCOUNTER — Other Ambulatory Visit: Payer: Self-pay | Admitting: Gastroenterology

## 2011-09-02 MED ORDER — OMEPRAZOLE 20 MG PO CPDR: 20.0000 mg | DELAYED_RELEASE_CAPSULE | Freq: Every day | ORAL | Status: DC

## 2011-09-02 NOTE — Telephone Encounter (Signed)
Pt called in and said his insurance will over omeprazole 20 mg. In a 90 day supply. [facility]

## 2011-09-09 ENCOUNTER — Other Ambulatory Visit: Payer: Self-pay

## 2011-09-09 ENCOUNTER — Telehealth: Payer: Self-pay | Admitting: Cardiology

## 2011-09-09 MED ORDER — RAMIPRIL 10 MG PO CAPS: 10.0000 mg | ORAL_CAPSULE | Freq: Every day | ORAL | Status: DC

## 2011-09-09 NOTE — Telephone Encounter (Signed)
New msg Pt called and said he hasnt received refill for [facility]ril [facility]. Please call him back

## 2011-09-09 NOTE — Telephone Encounter (Signed)
Medication refill resent to [facility].

## 2011-09-09 NOTE — Telephone Encounter (Signed)
Bill Cunningham requested that I call [facility] on [address] with a refill of his Altace, "just in case".  I called and ordered the altace and had them put the info into the computer but not fill it until he requests it.  Pt was notified that this was done.

## 2011-09-09 NOTE — Telephone Encounter (Signed)
Pt was notified.  

## 2011-10-02 ENCOUNTER — Other Ambulatory Visit: Payer: Self-pay | Admitting: Gastroenterology

## 2011-10-04 ENCOUNTER — Telehealth: Payer: Self-pay | Admitting: Cardiology

## 2011-10-04 ENCOUNTER — Ambulatory Visit: Payer: Medicare Other | Admitting: Cardiology

## 2011-10-04 MED ORDER — RAMIPRIL 10 MG PO CAPS: 10.0000 mg | ORAL_CAPSULE | Freq: Every day | ORAL | Status: DC

## 2011-10-04 NOTE — Telephone Encounter (Signed)
ramopril 10mg  90 day supply with refills, pt out would like asap today, wants a call when done, [facility] [place], pls call [phone]

## 2011-10-04 NOTE — Telephone Encounter (Signed)
Tried calling with busy signal ---- refill is done

## 2011-10-18 ENCOUNTER — Encounter: Payer: Self-pay | Admitting: Cardiology

## 2011-10-18 ENCOUNTER — Ambulatory Visit (INDEPENDENT_AMBULATORY_CARE_PROVIDER_SITE_OTHER): Payer: Medicare Other | Admitting: Cardiology

## 2011-10-18 VITALS — BP 135/67 | HR 60 | Ht 67.0 in | Wt 146.0 lb

## 2011-10-18 DIAGNOSIS — I255 Ischemic cardiomyopathy: Secondary | ICD-10-CM

## 2011-10-18 DIAGNOSIS — N400 Enlarged prostate without lower urinary tract symptoms: Secondary | ICD-10-CM

## 2011-10-18 DIAGNOSIS — I10 Essential (primary) hypertension: Secondary | ICD-10-CM

## 2011-10-18 DIAGNOSIS — I2589 Other forms of chronic ischemic heart disease: Secondary | ICD-10-CM

## 2011-10-18 DIAGNOSIS — I2510 Atherosclerotic heart disease of native coronary artery without angina pectoris: Secondary | ICD-10-CM

## 2011-10-18 MED ORDER — CARVEDILOL 12.5 MG PO TABS: 12.5000 mg | ORAL_TABLET | Freq: Two times a day (BID) | ORAL | Status: DC

## 2011-10-18 NOTE — Assessment & Plan Note (Signed)
He is on appropriate medications. His last echo was done January, 2013. The EF was 35-40% and this was improved for him. No change in therapy.

## 2011-10-18 NOTE — Patient Instructions (Addendum)
Your physician wants you to follow-up in: 6 months  You will receive a reminder letter in the mail two months in advance. If you don't receive a letter, please call our office to schedule the follow-up appointment.  Your physician recommends that you continue on your current medications as directed. Please refer to the Current Medication list given to you today.  

## 2011-10-18 NOTE — Assessment & Plan Note (Signed)
The patient had a nuclear study in 2012. It was not gated. This was done around the time that we saw worsening of his LV function. There was no significant ischemia. No further workup.

## 2011-10-18 NOTE — Assessment & Plan Note (Signed)
The patient has symptoms from his BPH. He tells me today that he has a very large prostate. It appears that his urologist most probably he is hesitant to proceed with a significant surgical procedure. I would certainly agree that it would be best not to proceed with a large surgical procedure for this gentleman. However his overall cardiac status is stable. It is not absolutely out of the question that he could have an operative procedure if there were an emergency.

## 2011-10-18 NOTE — Progress Notes (Signed)
HPI  The patient has been stable from the cardiac viewpoint. His pacemaker was checked by Dr. Ladona Ridgel in July, 2013. It is working well. He has been stable on appropriate medications for his left ventricular dysfunction. He does not have any marked symptoms of heart failure at this time.  He has had some problems with bladder outlet obstruction. He tells me that he has marked increased size of his prostate. He may have to have a permanent tube.  Allergies  Allergen Reactions  . Niaspan (Niacin Er)   . Sulfonamide Derivatives     Current Outpatient Prescriptions  Medication Sig Dispense Refill  . aspirin 81 MG tablet Take 81 mg by mouth daily.        . AVODART 0.5 MG capsule Take 1 tablet by mouth Daily.      . carvedilol (COREG) 12.5 MG tablet Take 1 tablet (12.5 mg total) by mouth 2 (two) times daily with a meal.  180 tablet  1  . cephALEXin (KEFLEX) 250 MG capsule Take 250 mg by mouth daily.      [name] docusate sodium (COLACE) 100 MG capsule Take 100 mg by mouth 2 (two) times daily.        [name] doxazosin (CARDURA) 4 MG tablet Take 4 mg by mouth daily.        . furosemide (LASIX) 40 MG tablet Take 1 tablet (40 mg total) by mouth daily.  90 tablet  3  . Multiple Vitamin (MULTIVITAMIN) tablet Take 1 tablet by mouth daily.        [name] omeprazole (PRILOSEC) 20 MG capsule Take 1 capsule (20 mg total) by mouth daily.  90 capsule  3  . potassium chloride SA (K-DUR,KLOR-CON) 20 MEQ tablet Take 1 tablet (20 mEq total) by mouth daily.  90 tablet  3  . ramipril (ALTACE) 10 MG capsule Take 1 capsule (10 mg total) by mouth daily.  90 capsule  2  . simvastatin (ZOCOR) 20 MG tablet Take 1 tablet (20 mg total) by mouth at bedtime.  90 tablet  3  . Wheat Dextrin (BENEFIBER) POWD Take by mouth daily.        . Zinc 50 MG TABS Take 50 mg by mouth daily.        [name] DISCONTD: omeprazole (PRILOSEC) 20 MG capsule TAKE 1 CAPSULE (20 MG TOTAL) BY MOUTH DAILY.  30 capsule  1    History   Social History  . Marital  Status: Married    Spouse Name: N/A    Number of Children: N/A  . Years of Education: N/A   Occupational History  . Not on file.   Social History Main Topics  . Smoking status: Former Smoker    Quit date: 02/08/1954  . Smokeless tobacco: Not on file  . Alcohol Use: No  . Drug Use: No  . Sexually Active: Not on file   Other Topics Concern  . Not on file   Social History Narrative  . No narrative on file    No family history on file.  Past Medical History  Diagnosis Date  . Hyperlipidemia   . LBBB (left bundle branch block)   . Depression   . COPD (chronic obstructive pulmonary disease)   . Calcium oxalate renal stones   . Dizziness   . GERD (gastroesophageal reflux disease)   . Diverticulosis   . IBS (irritable bowel syndrome)   . Leg cramps     not vascular in origin   . Liver cyst   .  Wide-complex tachycardia     post op - on a beta blocker   . Sinus congestion   . Bleeding nose     cauteriszed dr Lazarus Salines  / cauterization repeated 10/11  . Coronary artery disease     Nuclear June 03, 2010, scar anterior wall apex and inferior wall, no definite ischemia,    not gated  . Intolerance of drug     Niaspan  . S/P aortic valve replacement with bioprosthetic valve     echo.[name] April, 2011... good valve function... mild AI  . S/P CABG (coronary artery bypass graft) January, 2007  . LV dysfunction     EF 45% / EF 25-30%, echo, April, 2011 / EF 25-30%, echo, April, 2012, akinesis of the inferior and posterior walls  . Ischemic cardiomyopathy     EF followed carefully  . Carotid artery disease     Doppler, 2011, bilateral 40-59%    /   Doppler, March, 2013, no change, bilateral 40-59%    Past Surgical History  Procedure Date  . Cholecystectomy   . Aortic valve replacement 02/2005  . Insert / replace / remove pacemaker   . Coronary artery bypass graft     x5  -- last one in 07  . Lithotripsy     ROS   Patient denies fever, chills, headache, sweats, rash, change  in vision, change in hearing, chest pain, cough, nausea vomiting, urinary symptoms. All other systems are reviewed and are negative.  PHYSICAL EXAM  Filed Vitals:   10/18/11 1150  BP: 135/67  Pulse: 60  Height: 5\' 7"  (1.702 m)  Weight: 146 lb (66.225 kg)   EKG is done today and reviewed by me. It is paced.  ASSESSMENT & PLAN

## 2011-10-18 NOTE — Assessment & Plan Note (Signed)
Blood pressure is well controlled. No change in therapy. 

## 2011-11-22 DIAGNOSIS — I442 Atrioventricular block, complete: Secondary | ICD-10-CM

## 2011-11-26 ENCOUNTER — Inpatient Hospital Stay (HOSPITAL_COMMUNITY)
Admission: EM | Admit: 2011-11-26 | Discharge: 2011-11-28 | DRG: 292 | Disposition: A | Discharge status: Home / Self Care | Payer: Medicare Other | Attending: Internal Medicine | Admitting: Internal Medicine

## 2011-11-26 ENCOUNTER — Encounter (HOSPITAL_COMMUNITY): Payer: Self-pay | Admitting: *Deleted

## 2011-11-26 ENCOUNTER — Emergency Department (HOSPITAL_COMMUNITY): Payer: Medicare Other

## 2011-11-26 DIAGNOSIS — I779 Disorder of arteries and arterioles, unspecified: Secondary | ICD-10-CM

## 2011-11-26 DIAGNOSIS — R58 Hemorrhage, not elsewhere classified: Secondary | ICD-10-CM

## 2011-11-26 DIAGNOSIS — E871 Hypo-osmolality and hyponatremia: Secondary | ICD-10-CM | POA: Diagnosis present

## 2011-11-26 DIAGNOSIS — I5023 Acute on chronic systolic (congestive) heart failure: Principal | ICD-10-CM

## 2011-11-26 DIAGNOSIS — I509 Heart failure, unspecified: Secondary | ICD-10-CM

## 2011-11-26 DIAGNOSIS — R0989 Other specified symptoms and signs involving the circulatory and respiratory systems: Secondary | ICD-10-CM

## 2011-11-26 DIAGNOSIS — I5022 Chronic systolic (congestive) heart failure: Secondary | ICD-10-CM

## 2011-11-26 DIAGNOSIS — Z950 Presence of cardiac pacemaker: Secondary | ICD-10-CM | POA: Diagnosis present

## 2011-11-26 DIAGNOSIS — N400 Enlarged prostate without lower urinary tract symptoms: Secondary | ICD-10-CM | POA: Diagnosis present

## 2011-11-26 DIAGNOSIS — I255 Ischemic cardiomyopathy: Secondary | ICD-10-CM

## 2011-11-26 DIAGNOSIS — I447 Left bundle-branch block, unspecified: Secondary | ICD-10-CM

## 2011-11-26 DIAGNOSIS — Z951 Presence of aortocoronary bypass graft: Secondary | ICD-10-CM

## 2011-11-26 DIAGNOSIS — K219 Gastro-esophageal reflux disease without esophagitis: Secondary | ICD-10-CM

## 2011-11-26 DIAGNOSIS — K5730 Diverticulosis of large intestine without perforation or abscess without bleeding: Secondary | ICD-10-CM

## 2011-11-26 DIAGNOSIS — Z954 Presence of other heart-valve replacement: Secondary | ICD-10-CM

## 2011-11-26 DIAGNOSIS — J449 Chronic obstructive pulmonary disease, unspecified: Secondary | ICD-10-CM

## 2011-11-26 DIAGNOSIS — R0902 Hypoxemia: Secondary | ICD-10-CM | POA: Diagnosis present

## 2011-11-26 DIAGNOSIS — I10 Essential (primary) hypertension: Secondary | ICD-10-CM

## 2011-11-26 DIAGNOSIS — E785 Hyperlipidemia, unspecified: Secondary | ICD-10-CM

## 2011-11-26 DIAGNOSIS — I2589 Other forms of chronic ischemic heart disease: Secondary | ICD-10-CM

## 2011-11-26 DIAGNOSIS — R0609 Other forms of dyspnea: Secondary | ICD-10-CM | POA: Diagnosis present

## 2011-11-26 DIAGNOSIS — J4489 Other specified chronic obstructive pulmonary disease: Secondary | ICD-10-CM

## 2011-11-26 DIAGNOSIS — I2510 Atherosclerotic heart disease of native coronary artery without angina pectoris: Secondary | ICD-10-CM

## 2011-11-26 LAB — CBC
MCH: 28.9 pg (ref 26.0–34.0)
MCH: 28.8 pg (ref 26.0–34.0)
MCHC: 35 g/dL (ref 30.0–36.0)
MCV: 82.7 fL (ref 78.0–100.0)
MCV: 83 fL (ref 78.0–100.0)
Platelets: 204 10*3/uL (ref 150–400)
Platelets: 210 10*3/uL (ref 150–400)
RBC: 4.23 MIL/uL (ref 4.22–5.81)
RDW: 12.9 % (ref 11.5–15.5)
WBC: 8.9 10*3/uL (ref 4.0–10.5)

## 2011-11-26 LAB — BASIC METABOLIC PANEL
BUN: 16 mg/dL (ref 6–23)
CO2: 23 mEq/L (ref 19–32)
Calcium: 9.3 mg/dL (ref 8.4–10.5)
Creatinine, Ser: 1.05 mg/dL (ref 0.50–1.35)
GFR calc non Af Amer: 61 mL/min — ABNORMAL LOW (ref >90)
Glucose, Bld: 127 mg/dL — ABNORMAL HIGH (ref 70–99)

## 2011-11-26 LAB — POCT I-STAT TROPONIN I: Troponin i, poc: 0.02 ng/mL (ref 0.00–0.08)

## 2011-11-26 LAB — CREATININE, SERUM
Creatinine, Ser: 1.06 mg/dL (ref 0.50–1.35)
GFR calc Af Amer: 70 mL/min — ABNORMAL LOW (ref >90)

## 2011-11-26 LAB — D-DIMER, QUANTITATIVE: D-Dimer, Quant: 0.43 ug/mL-FEU (ref 0.00–0.48)

## 2011-11-26 LAB — TSH: TSH: 1.401 u[IU]/mL (ref 0.350–4.500)

## 2011-11-26 MED ORDER — CARVEDILOL 12.5 MG PO TABS
12.5000 mg | ORAL_TABLET | Freq: Two times a day (BID) | ORAL | Status: DC
  Administered 2011-11-26 – 2011-11-28 (×5): 12.5 mg via ORAL
  Filled 2011-11-26 (×7): qty 1

## 2011-11-26 MED ORDER — SIMVASTATIN 20 MG PO TABS
20.0000 mg | ORAL_TABLET | Freq: Every day | ORAL | Status: DC
  Administered 2011-11-26 – 2011-11-27 (×2): 20 mg via ORAL
  Filled 2011-11-26 (×3): qty 1

## 2011-11-26 MED ORDER — DOCUSATE SODIUM 100 MG PO CAPS
100.0000 mg | ORAL_CAPSULE | Freq: Two times a day (BID) | ORAL | Status: DC
  Administered 2011-11-26 – 2011-11-28 (×5): 100 mg via ORAL
  Filled 2011-11-26 (×6): qty 1

## 2011-11-26 MED ORDER — DUTASTERIDE 0.5 MG PO CAPS
0.5000 mg | ORAL_CAPSULE | Freq: Every day | ORAL | Status: DC
  Administered 2011-11-26 – 2011-11-28 (×3): 0.5 mg via ORAL
  Filled 2011-11-26 (×3): qty 1

## 2011-11-26 MED ORDER — DOXAZOSIN MESYLATE 4 MG PO TABS
4.0000 mg | ORAL_TABLET | Freq: Every day | ORAL | Status: DC
  Administered 2011-11-26 – 2011-11-28 (×3): 4 mg via ORAL
  Filled 2011-11-26 (×3): qty 1

## 2011-11-26 MED ORDER — ADULT MULTIVITAMIN W/MINERALS CH
1.0000 | ORAL_TABLET | Freq: Every day | ORAL | Status: DC
  Administered 2011-11-26 – 2011-11-28 (×3): 1 via ORAL
  Filled 2011-11-26 (×3): qty 1

## 2011-11-26 MED ORDER — CEPHALEXIN 250 MG PO CAPS
250.0000 mg | ORAL_CAPSULE | Freq: Every day | ORAL | Status: DC
  Administered 2011-11-26 – 2011-11-28 (×3): 250 mg via ORAL
  Filled 2011-11-26 (×3): qty 1

## 2011-11-26 MED ORDER — ASPIRIN EC 81 MG PO TBEC
81.0000 mg | DELAYED_RELEASE_TABLET | Freq: Every day | ORAL | Status: DC
  Administered 2011-11-26 – 2011-11-28 (×3): 81 mg via ORAL
  Filled 2011-11-26 (×3): qty 1

## 2011-11-26 MED ORDER — SODIUM CHLORIDE 0.9 % IJ SOLN
3.0000 mL | Freq: Two times a day (BID) | INTRAMUSCULAR | Status: DC
  Administered 2011-11-27 (×2): 3 mL via INTRAVENOUS

## 2011-11-26 MED ORDER — ACETAMINOPHEN 650 MG RE SUPP: 650.0000 mg | Freq: Four times a day (QID) | RECTAL | Status: DC | PRN

## 2011-11-26 MED ORDER — INFLUENZA VIRUS VACC SPLIT PF IM SUSP
0.5000 mL | INTRAMUSCULAR | Status: AC
  Administered 2011-11-27: 0.5 mL via INTRAMUSCULAR
  Filled 2011-11-26 (×2): qty 0.5

## 2011-11-26 MED ORDER — FUROSEMIDE 10 MG/ML IJ SOLN
80.0000 mg | Freq: Once | INTRAMUSCULAR | Status: AC
  Administered 2011-11-26: 80 mg via INTRAVENOUS
  Filled 2011-11-26: qty 8

## 2011-11-26 MED ORDER — RAMIPRIL 10 MG PO CAPS
10.0000 mg | ORAL_CAPSULE | Freq: Every day | ORAL | Status: DC
  Administered 2011-11-26 – 2011-11-28 (×3): 10 mg via ORAL
  Filled 2011-11-26 (×3): qty 1

## 2011-11-26 MED ORDER — ACETAMINOPHEN 325 MG PO TABS: 650.0000 mg | ORAL_TABLET | Freq: Four times a day (QID) | ORAL | Status: DC | PRN

## 2011-11-26 MED ORDER — ENOXAPARIN SODIUM 40 MG/0.4ML ~~LOC~~ SOLN
40.0000 mg | SUBCUTANEOUS | Status: DC
  Administered 2011-11-26 – 2011-11-27 (×2): 40 mg via SUBCUTANEOUS
  Filled 2011-11-26 (×3): qty 0.4

## 2011-11-26 MED ORDER — PANTOPRAZOLE SODIUM 40 MG PO TBEC
40.0000 mg | DELAYED_RELEASE_TABLET | Freq: Every day | ORAL | Status: DC
  Administered 2011-11-26 – 2011-11-28 (×3): 40 mg via ORAL
  Filled 2011-11-26 (×3): qty 1

## 2011-11-26 MED ORDER — POTASSIUM CHLORIDE CRYS ER 20 MEQ PO TBCR
20.0000 meq | EXTENDED_RELEASE_TABLET | Freq: Every day | ORAL | Status: DC
  Administered 2011-11-26 – 2011-11-28 (×3): 20 meq via ORAL
  Filled 2011-11-26 (×3): qty 1

## 2011-11-26 MED ORDER — SODIUM CHLORIDE 0.9 % IJ SOLN: 3.0000 mL | INTRAMUSCULAR | Status: DC | PRN

## 2011-11-26 MED ORDER — SODIUM CHLORIDE 0.9 % IV SOLN: 250.0000 mL | INTRAVENOUS | Status: DC | PRN

## 2011-11-26 MED ORDER — SODIUM CHLORIDE 0.9 % IJ SOLN: 3.0000 mL | Freq: Two times a day (BID) | INTRAMUSCULAR | Status: DC

## 2011-11-26 MED ORDER — FUROSEMIDE 10 MG/ML IJ SOLN
40.0000 mg | Freq: Two times a day (BID) | INTRAMUSCULAR | Status: DC
  Administered 2011-11-26 – 2011-11-27 (×4): 40 mg via INTRAVENOUS
  Filled 2011-11-26 (×7): qty 4

## 2011-11-26 NOTE — ED Notes (Signed)
Pt states woke up around 3-4am c/o increased shortness of breath; states felt like air was going to cut completely off at one time; states was fine when he went to bed; denies any chest pain

## 2011-11-26 NOTE — ED Notes (Signed)
MD at bedside. 

## 2011-11-26 NOTE — ED Provider Notes (Signed)
History    CSN: [id number] Arrival date & time 11/26/11  [id number] First MD Initiated Contact with Patient 11/26/11 [phone]   Chief complaint shortness of breath  Patient is a 78 y.o. male presenting with shortness of breath. The history is provided by the patient.  Shortness of Breath  The current episode started today. The onset was sudden. The problem has been gradually improving. The problem is moderate. Relieved by: Sitting upright. The symptoms are aggravated by a supine position. Associated symptoms include shortness of breath. Pertinent negatives include no chest pain, no chest pressure, no fever, no sore throat, no cough and no wheezing.   the patient woke up last night when he felt like" his air was cutting off".  He felt the symptoms were pretty severe but then slowly resolved after maybe a minute or so. Patient states he has noticed the symptoms coming back but not to the same severity as he did earlier this evening. He does feel like he gets a little worse when he is lying flat. He does feel better sitting up. He has not had any trouble with any chest pain.  He chronically has some trouble with swelling in his lower extremities but usually goes away in the morning. They are a little bit more swollen than usual at this time. Denies any recent trips or travel. He does have history of coronary artery disease and has had an aortic valve replacement and also has a pacemaker. Patient apparently has a history of COPD but he states he was a smoker only for a short period of time in his teenage years and he never has had asthma. He does not use any inhalers at home.  Past Medical History  Diagnosis Date  . Hyperlipidemia   . LBBB (left bundle branch block)   . Depression   . COPD (chronic obstructive pulmonary disease)   . Calcium oxalate renal stones   . Dizziness   . GERD (gastroesophageal reflux disease)   . Diverticulosis   . IBS (irritable bowel syndrome)   . Leg cramps     not vascular in  origin   . Liver cyst   . Wide-complex tachycardia     post op - on a beta blocker   . Sinus congestion   . Bleeding nose     cauteriszed dr Lazarus Salines  / cauterization repeated 10/11  . Coronary artery disease     Nuclear June 03, 2010, scar anterior wall apex and inferior wall, no definite ischemia,    not gated  . Intolerance of drug     Niaspan  . S/P aortic valve replacement with bioprosthetic valve     echo.[name] April, 2011... good valve function... mild AI  . S/P CABG (coronary artery bypass graft) January, 2007  . LV dysfunction     EF 45% / EF 25-30%, echo, April, 2011 / EF 25-30%, echo, April, 2012, akinesis of the inferior and posterior walls  . Ischemic cardiomyopathy     EF followed carefully  . Carotid artery disease     Doppler, 2011, bilateral 40-59%    /   Doppler, March, 2013, no change, bilateral 40-59%  . BPH (benign prostatic hyperplasia)     2013    Past Surgical History  Procedure Date  . Cholecystectomy   . Aortic valve replacement 02/2005  . Insert / replace / remove pacemaker   . Coronary artery bypass graft     x5  -- last one in 07  . Lithotripsy  No family history on file.  History  Substance Use Topics  . Smoking status: Former Smoker    Quit date: 02/08/1954  . Smokeless tobacco: Not on file  . Alcohol Use: No      Review of Systems  Constitutional: Negative for fever.  HENT: Negative for sore throat.   Respiratory: Positive for shortness of breath. Negative for cough and wheezing.   Cardiovascular: Negative for chest pain.  All other systems reviewed and are negative.    Allergies  Niaspan and Sulfonamide derivatives  Home Medications   Current Outpatient Rx  Name Route Sig Dispense Refill  . AVODART 0.5 MG PO CAPS Oral Take 1 tablet by mouth Daily.    [name] CARVEDILOL 12.5 MG PO TABS Oral Take 1 tablet (12.5 mg total) by mouth 2 (two) times daily with a meal. 60 tablet 6    Pt may only want one month dispensed.  . CEPHALEXIN  250 MG PO CAPS Oral Take 250 mg by mouth daily.    [name] DOCUSATE SODIUM 100 MG PO CAPS Oral Take 100 mg by mouth 2 (two) times daily.      [name] DOXAZOSIN MESYLATE 4 MG PO TABS Oral Take 4 mg by mouth daily.      . FUROSEMIDE 40 MG PO TABS Oral Take 1 tablet (40 mg total) by mouth daily. 90 tablet 3  . ONE-DAILY MULTI VITAMINS PO TABS Oral Take 1 tablet by mouth daily.      [name] OMEPRAZOLE 20 MG PO CPDR Oral Take 1 capsule (20 mg total) by mouth daily. 90 capsule 3  . POTASSIUM CHLORIDE CRYS ER 20 MEQ PO TBCR Oral Take 1 tablet (20 mEq total) by mouth daily. 90 tablet 3  . RAMIPRIL 10 MG PO CAPS Oral Take 1 capsule (10 mg total) by mouth daily. 90 capsule 2    DO NOT FILL UNLESS HE CALLS FOR IT, THANKS  . SIMVASTATIN 20 MG PO TABS Oral Take 1 tablet (20 mg total) by mouth at bedtime. 90 tablet 3  . BENEFIBER PO POWD Oral Take by mouth daily.      [name] ZINC 50 MG PO TABS Oral Take 50 mg by mouth daily.        BP 145/72  Pulse 86  Temp 97.8 F (36.6 C)  Resp 20  SpO2 99%  Physical Exam  Nursing note and vitals reviewed. Constitutional: He appears well-developed and well-nourished. No distress.  HENT:  Head: Normocephalic and atraumatic.  Right Ear: External ear normal.  Left Ear: External ear normal.  Eyes: Conjunctivae normal are normal. Right eye exhibits no discharge. Left eye exhibits no discharge. No scleral icterus.  Neck: Neck supple. JVD present. No tracheal deviation present.  Cardiovascular: Normal rate, regular rhythm and intact distal pulses.   Pulmonary/Chest: Effort normal. No stridor. No respiratory distress. He has no wheezes. He has rales (inspiratory crackles bilateral lower lung field).  Abdominal: Soft. Bowel sounds are normal. He exhibits no distension. There is no tenderness. There is no rebound and no guarding.  Musculoskeletal: He exhibits edema. He exhibits no tenderness.  Neurological: He is alert. He has normal strength. No sensory deficit. Cranial nerve deficit:  no  gross defecits noted. He exhibits normal muscle tone. He displays no seizure activity. Coordination normal.  Skin: Skin is warm and dry. No rash noted.  Psychiatric: He has a normal mood and affect.    ED Course  Procedures (including critical care time) EKG Rate 88 Electronically paced Wide QRS  complex, left bundle branch block No significant change since prior tracing Labs Reviewed  BASIC METABOLIC PANEL - Abnormal; Notable for the following:    Sodium 124 (*)     Chloride 89 (*)     Glucose, Bld 127 (*)     GFR calc non Af Amer 61 (*)     GFR calc Af Amer 71 (*)     All other components within normal limits  CBC - Abnormal; Notable for the following:    RBC 4.15 (*)     Hemoglobin 12.0 (*)     HCT 34.3 (*)     All other components within normal limits  POCT I-STAT TROPONIN I  PRO B NATRIURETIC PEPTIDE   Dg Chest 2 View  11/26/2011  *RADIOLOGY REPORT*  Clinical Data: Shortness of breath.  CHEST - 2 VIEW  Comparison: 02/01/2011  Findings: Stable appearance of postoperative changes in the mediastinum and cardiac pacemaker.  Mild cardiac enlargement with normal pulmonary vascularity.  New blunting of costophrenic angles suggest small effusions.  Interstitial changes could represent fibrosis or interstitial edema.  Pleural calcifications on the right.  Calcification of the aorta.  No pneumothorax.  IMPRESSION: Cardiac enlargement without significant vascular congestion.  Small pleural effusions.  Interstitial edema versus fibrosis.   Original Report Authenticated By: Marlon Pel, M.D.      1. CHF (congestive heart failure)       MDM  BNP is pending however I suspect CHF exacerbation as the etiology of his symptoms.  CXR suggests possible intersitial edema.  He has worsening peripheral edema and JVD and has known history of ischemic cardiomyopathy.  Will admit for treatment and further eval.  Hyponatremia and hypochloremia noted.  Will need to monitor his electrolytes.   Changed from most recent but he was in the 120s in january       Celene Kras, MD 11/26/11 0730

## 2011-11-26 NOTE — Progress Notes (Signed)
   CARE MANAGEMENT NOTE 11/26/2011  Patient:  Bill Cunningham, Bill Cunningham   Account Number:  [id number]  Date Initiated:  11/26/2011  Documentation initiated by:  Jiles Crocker  Subjective/Objective Assessment:   ADMITTED WITH CHF     Action/Plan:   PCP: Beverley Fiedler, MD  LIVES WITH SPOUSE; POSSIBLE NEEDS HHC AT DISCHARGE, AWAITING ON PT/OT EVALS   Anticipated DC Date:  12/03/2011   Anticipated DC Plan:  HOME W HOME HEALTH SERVICES      DC Planning Services  CM consult             Status of service:  In process, will continue to follow Medicare Important Message given?  NA - LOS <3 / Initial given by admissions (If response is "NO", the following Medicare IM given date fields will be blank)  Per UR Regulation:  Reviewed for med. necessity/level of care/duration of stay  Comments:  11/26/2011- B Ladesha Pacini RN, BSN, MHA

## 2011-11-26 NOTE — Consult Note (Signed)
CARDIOLOGY CONSULT NOTE   Patient ID: Bill Cunningham MRN: [id number] DOB/AGE: Nov 25, 1921 78 y.o.  Admit date: 11/26/2011  Primary Physician   Beverley Fiedler, MD Primary Cardiologist   JK/GT, last O.V. 10/18/2011  Reason for Consultation   Possible CHF  [name] is a 78 y.o. male with a history of CAD. He was doing well then from a volume and an ischemic standpoint.  He was admitted this am with significant DOE. There is concern for a CHF component so cardiology was asked to evaluate him.   Pt has otherwise been doing OK, no weight changes, small amount of daytime edema only. He has recently been struggling with an enlarged prostate, difficulty urinating and nocturia - therefore sleeping poorly.   Pt got up to BR at 3 am, when lying back down, had sudden onset SOB. It was severe but not associated with chest pain, diaphoresis or palpitations. No N&V. His SOB improved but did not get back to baseline. He thought about it and felt that he was still slightly SOB with exertion. This concerned him so he came in. He has given Lasix 80 mg and then 40 mg IV today. He had some LE edema this am, but this had improved. He still feels he has a little more DOE than usual, but is almost back to baseline. No wheeze, no PND, sinus congestion but no URI, no increase in cough (non-productive).  His wt was 67.132 kg January 2013, basically unchanged today.  Past Medical History  Diagnosis Date  . Hyperlipidemia   . LBBB (left bundle branch block)   . Depression   . COPD (chronic obstructive pulmonary disease)   . Calcium oxalate renal stones   . Dizziness   . GERD (gastroesophageal reflux disease)   . Diverticulosis   . IBS (irritable bowel syndrome)   . Leg cramps     not vascular in origin   . Liver cyst   . Wide-complex tachycardia     post op - on a beta blocker   . Sinus congestion   . Bleeding nose     cauteriszed dr Lazarus Salines  / cauterization repeated 10/11  . Coronary artery  disease     Nuclear June 03, 2010, scar anterior wall apex and inferior wall, no definite ischemia,    not gated  . Intolerance of drug     Niaspan  . S/P aortic valve replacement with bioprosthetic valve     echo.[name] April, 2011... good valve function... mild AI  . S/P CABG (coronary artery bypass graft) January, 2007  . LV dysfunction     EF 45% / EF 25-30%, echo, April, 2011 / EF 25-30%, echo, April, 2012, akinesis of the inferior and posterior walls  . Ischemic cardiomyopathy     EF followed carefully  . Carotid artery disease     Doppler, 2011, bilateral 40-59%    /   Doppler, March, 2013, no change, bilateral 40-59%  . BPH (benign prostatic hyperplasia)     2013     Past Surgical History  Procedure Date  . Cholecystectomy   . Aortic valve replacement 02/2005  . Insert / replace / remove pacemaker   . Coronary artery bypass graft     x5  -- last one in 07  . Lithotripsy     Allergies  Allergen Reactions  . Niaspan (Niacin Er) Other (See Comments)    Unknown reaction  . Sulfonamide Derivatives Other (See Comments)    Unknown reaction  I have reviewed the patient's current medications    . aspirin EC  81 mg Oral Daily  . carvedilol  12.5 mg Oral BID WC  . cephALEXin  250 mg Oral Daily  . docusate sodium  100 mg Oral BID  . doxazosin  4 mg Oral Daily  . dutasteride  0.5 mg Oral Daily  . enoxaparin (LOVENOX) injection  40 mg Subcutaneous Q24H  . furosemide  40 mg Intravenous BID  . furosemide  80 mg Intravenous Once  . influenza  inactive virus vaccine  0.5 mL Intramuscular Tomorrow-1000  . multivitamin with minerals  1 tablet Oral Daily  . pantoprazole  40 mg Oral Daily  . potassium chloride SA  20 mEq Oral Daily  . ramipril  10 mg Oral Daily  . simvastatin  20 mg Oral QHS  . sodium chloride  3 mL Intravenous Q12H  . sodium chloride  3 mL Intravenous Q12H     sodium chloride, acetaminophen, acetaminophen, sodium chloride  Prior to Admission medications     Medication Sig Start Date End Date Taking? Authorizing Provider  aspirin EC 81 MG tablet Take 81 mg by mouth daily.   Yes Historical Provider, MD  AVODART 0.5 MG capsule Take 1 tablet by mouth Daily. 09/28/11  Yes Historical Provider, MD  carvedilol (COREG) 12.5 MG tablet Take 1 tablet (12.5 mg total) by mouth 2 (two) times daily with a meal. 10/18/11  Yes [name], MD  cephALEXin (KEFLEX) 250 MG capsule Take 250 mg by mouth daily.   Yes Historical Provider, MD  docusate sodium (COLACE) 100 MG capsule Take 100 mg by mouth 2 (two) times daily.     Yes Historical Provider, MD  doxazosin (CARDURA) 4 MG tablet Take 4 mg by mouth daily.     Yes Historical Provider, MD  furosemide (LASIX) 40 MG tablet Take 1 tablet (40 mg total) by mouth daily. 03/16/11  Yes [name], MD  Multiple Vitamin (MULTIVITAMIN) tablet Take 1 tablet by mouth daily.     Yes Historical Provider, MD  omeprazole (PRILOSEC) 20 MG capsule Take 1 capsule (20 mg total) by mouth daily. 09/02/11 09/01/12 Yes Mardella Layman, MD  potassium chloride SA (K-DUR,KLOR-CON) 20 MEQ tablet Take 1 tablet (20 mEq total) by mouth daily. 08/17/11  Yes Marinus Maw, MD  ramipril (ALTACE) 10 MG capsule Take 1 capsule (10 mg total) by mouth daily. 10/04/11  Yes [name], MD  simvastatin (ZOCOR) 20 MG tablet Take 1 tablet (20 mg total) by mouth at bedtime. 08/30/11  Yes [name], MD  Wheat Dextrin [facility]) POWD Take by mouth daily.     Yes Historical Provider, MD  Zinc 50 MG TABS Take 50 mg by mouth daily.     Yes Historical Provider, MD     History   Social History  . Marital Status: Married    Spouse Name: N/A    Number of Children: N/A  . Years of Education: N/A   Occupational History  . Retired Radiographer, therapeutic    Social History Main Topics  . Smoking status: Former Smoker    Quit date: 02/08/1954  . Smokeless tobacco: Not on file  . Alcohol Use: No  . Drug Use: No  . Sexually Active: Not on file   Other Topics Concern  .  Not on file   Social History Narrative   No CAD known in any siblings.    Family Status  Relation Status Death Age  .  Father Deceased 70s     MI  . Mother Deceased     No CAD known       ROS:  Full 14 point review of systems complete and found to be negative unless listed above.  Physical Exam: Blood pressure 111/50, pulse 58, temperature 98.1 F (36.7 C), temperature source Oral, resp. rate 18, height 5\' 7"  (1.702 m), weight 147 lb 11.3 oz (67 kg), SpO2 99.00%.  General: Well developed, well nourished, male in no acute distress Head: Eyes PERRLA, No xanthomas.   Normocephalic and atraumatic, oropharynx without edema or exudate. Dentition: poor Lungs: bilateral rales, no rhonchi, no crackles Heart: HRRR S1 S2, no rub/gallop,  2/6 murmur. pulses are 2+ all 4 extrem.   Neck: No carotid bruits. No lymphadenopathy.  JVD at about 7 cm. Abdomen: Bowel sounds present, abdomen soft and non-tender without masses or hernias noted. Msk:  No spine or cva tenderness. No weakness, no joint deformities or effusions. Extremities: No clubbing or cyanosis. no edema.  Neuro: Alert and oriented X 3. No focal deficits noted. Psych:  Good affect, responds appropriately Skin: No rashes or lesions noted.  Labs:   Lab Results  Component Value Date   WBC 8.9 11/26/2011   HGB 12.2* 11/26/2011   HCT 35.1* 11/26/2011   MCV 83.0 11/26/2011   PLT 210 11/26/2011      Lab 11/26/11 1013 11/26/11 0553  NA -- 124*  K -- 4.5  CL -- 89*  CO2 -- 23  BUN -- 16  CREATININE 1.06 --  CALCIUM -- 9.3  PROT -- --  BILITOT -- --  ALKPHOS -- --  ALT -- --  AST -- --  GLUCOSE -- 127*    Basename 11/26/11 1013  CKTOTAL --  CKMB --  TROPONINI <0.30    Basename 11/26/11 0618  TROPIPOC 0.02   Pro B Natriuretic peptide (BNP)  Date/Time Value Range Status  11/26/2011  5:53 AM 8917.0* 0 - 450 pg/mL Final   TSH  Date/Time Value Range Status  11/26/2011 10:13 AM 1.401  0.350 - 4.500 uIU/mL Final    Echo: 02/18/2011 Study Conclusions - Left ventricle: The cavity size was moderately dilated. Wall thickness was normal. Systolic function was moderately reduced. The estimated ejection fraction was in the range of 35% to 40%. There is hypokinesis of the mid-distalanteroseptal myocardium. Doppler parameters are consistent with abnormal left ventricular relaxation (grade 1 diastolic dysfunction). - Aortic valve: A prosthesis was present and functioning normally. The prosthesis had a normal range of motion. The sewing ring appeared normal, had no rocking motion, and showed no evidence of dehiscence. There was mild stenosis. - Mitral valve: Mild regurgitation. - Left atrium: The atrium was mildly dilated.  ECG:  26-Nov-2011 05:43:37 [facility] ED ROUTINE RECORD ATRIAL-SENSED VENTRICULAR-PACED RHYTHM ~ ventricular pacing tracks p-waves NO FURTHER ANALYSIS ATTEMPTED DUE TO PACED RHYTHM RECONSTRUCTED PACER SPIKES IN LD(S) II ~ bedside recording Standard 12 Lead Report ~ Not Confirmed Abnormal ECG 26mm/s 77mm/mV 150Hz  8.0.1 12SL 235 CID: [id number] Referred by: Unconfirmed Vent. rate 88 BPM PR interval 182 ms QRS duration 176 ms QT/QTc 448/542 ms P-R-T axes -46 240 51  Radiology:  Dg Chest 2 View 11/26/2011  *RADIOLOGY REPORT*  Clinical Data: Shortness of breath.  CHEST - 2 VIEW  Comparison: 02/01/2011  Findings: Stable appearance of postoperative changes in the mediastinum and cardiac pacemaker.  Mild cardiac enlargement with normal pulmonary vascularity.  New blunting of costophrenic angles suggest small effusions.  Interstitial  changes could represent fibrosis or interstitial edema.  Pleural calcifications on the right.  Calcification of the aorta.  No pneumothorax.  IMPRESSION: Cardiac enlargement without significant vascular congestion.  Small pleural effusions.  Interstitial edema versus fibrosis.   Original Report Authenticated By: Marlon Pel, M.D.      ASSESSMENT AND PLAN:   The patient was seen today by Dr [place], the patient evaluated and the data reviewed.  Acute on chronic systolic congestive heart failure - pt has some volume overload and has responded well to diuresis. Possibly could change to PO Rx in am. Agree with looking for other reasons for SOB, will check a d-dimer. If normal, do not think CT needed.  Otherwise, per primary MD. Active Problems:  PACEMAKER, PERMANENT  BPH (benign prostatic hyperplasia)  Hyponatremia  Dyspnea on exertion   Signed: Theodore Demark 11/26/2011, 3:40 PM Co-Sign MD Patient seen and examined and history reviewed. Agree with above findings and plan. Elderly WM with known systolic CHF presents with acute dyspnea. Has noted some recent dyspnea on exertion. Exam reveals mild JVD. Few basilar rales. No gallop. Soft SEM. Trace edema. Rhythm is paced. BNP is high. No baseline for comparison.   Imp: 1. Acute on chronic systolic CHF. Patient has significant dietary sodium intake. He frequently eats canned soups, potato chips, saltines, gatorade, sausage,and salts his vegetables. Agree with IV diuresis. Initial response is very good. Need to instruct in sodium restriction-dietary consult. Continue fluid restriction with hyponatremia. May be able to convert to po Lasix tomorrow. Will check d-dimer but I think pulmonary embolus is unlikely. 2. CAD s/p CABG 3. S/p AVR for severe AS. 4. Hyponatremia. 5. S/p permanent pacemaker.  Theron [facility] 11/26/2011 3:49 PM

## 2011-11-26 NOTE — Progress Notes (Signed)
Nutrition Consult for diet ed  Pt to follow low sodium diet.  Eats canned meat, bacon, sausage, canned soup, chips.  Instructed pt, dau, and wife on a low sodium diet.  All able to state changes to make.  Written info on a low sodium diet including seasoning tips provided with RD name and number.  Oran Rein, RD, LDN Clinical Inpatient Dietitian Pager:  [phone] Weekend and after hours pager:  [phone]

## 2011-11-26 NOTE — Progress Notes (Addendum)
Hospital Admission Note Date: 11/26/2011  Patient name: Bill Cunningham Medical record number: [id number] Date of birth: [birth year] Age: 78 y.o. Gender: male PCP: Beverley Fiedler, MD  Attending physician: Altha Harm, MD  Chief Complaint:Dyspnea on exertion.  History of Present Illness: Pt is an elderly gentle man with a history of CAD S/P CABG ,valve replacement, and Chronic systolic heart failure who got up to go to the bathroom this morning and had an episode of severe DOE. He states that he has a baseline SOB with activity secondary to his cardiac issues, but this was of sudden onset and more intense than usual. He states that he has intermittet PND but has not had any orthopnea and is still able to sleep flat on 1 pillow which he demonstrated  during the examination. He does however also demonstrate increased SOB with only minimal activity. The pateint also reports that he weighs himself about every other day, and that his weight has not changed in the last week. He also is adherent to a fluid restriction of 48 oz of fluid/day.   He denies any chest pain, fever, chills, nausea, vomiting or diarrhea.   Scheduled Meds:   . furosemide  80 mg Intravenous Once   Continuous Infusions:  PRN Meds:. Allergies: Niaspan and Sulfonamide derivatives Past Medical History  Diagnosis Date  . Hyperlipidemia   . LBBB (left bundle branch block)   . Depression   . COPD (chronic obstructive pulmonary disease)   . Calcium oxalate renal stones   . Dizziness   . GERD (gastroesophageal reflux disease)   . Diverticulosis   . IBS (irritable bowel syndrome)   . Leg cramps     not vascular in origin   . Liver cyst   . Wide-complex tachycardia     post op - on a beta blocker   . Sinus congestion   . Bleeding nose     cauteriszed dr Lazarus Salines  / cauterization repeated 10/11  . Coronary artery disease     Nuclear June 03, 2010, scar anterior wall apex and inferior wall, no definite ischemia,     not gated  . Intolerance of drug     Niaspan  . S/P aortic valve replacement with bioprosthetic valve     echo.[name] April, 2011... good valve function... mild AI  . S/P CABG (coronary artery bypass graft) January, 2007  . LV dysfunction     EF 45% / EF 25-30%, echo, April, 2011 / EF 25-30%, echo, April, 2012, akinesis of the inferior and posterior walls  . Ischemic cardiomyopathy     EF followed carefully  . Carotid artery disease     Doppler, 2011, bilateral 40-59%    /   Doppler, March, 2013, no change, bilateral 40-59%  . BPH (benign prostatic hyperplasia)     2013   Past Surgical History  Procedure Date  . Cholecystectomy   . Aortic valve replacement 02/2005  . Insert / replace / remove pacemaker   . Coronary artery bypass graft     x5  -- last one in 07  . Lithotripsy    No family history on file. History   Social History  . Marital Status: Married    Spouse Name: N/A    Number of Children: N/A  . Years of Education: N/A   Occupational History  . Not on file.   Social History Main Topics  . Smoking status: Former Smoker    Quit date: 02/08/1954  . Smokeless tobacco: Not on  file  . Alcohol Use: No  . Drug Use: No  . Sexually Active: Not on file   Family History Concern  . HTN, CAD Father and mother   Social History Narrative  . Lives with wife. No smoking, no ETOH use.   Review of Systems: A comprehensive review of systems was negative except as noted in the HPI. Physical Exam: General: Alert, awake, oriented x3, in no acute distress. Appears euvolemic. HEENT: Mina/AT PEERL, EOMI Neck: Trachea midline,  no masses, no thyromegal,y no JVD, no carotid bruit OROPHARYNX:  Moist, No exudate/ erythema/lesions.  Heart: Regular rate and rhythm, II-III/VI murmur at 2nd intercostal space, no rubs or gallops, PMI non-displaced, no heaves or thrills on palpation.  Lungs: Clear to auscultation, no wheezing or rhonchi noted.  Abdomen: Soft, nontender, nondistended, positive  bowel sounds, no masses no hepatosplenomegaly noted..  Neuro: No focal neurological deficits noted . Strength functional in bilateral upper and lower extremities. Musculoskeletal: No warm swelling or erythema around joints, no spinal tenderness noted. Psychiatric: Patient alert and oriented x3, good insight and cognition, good recent to remote recall. Skin: Pt has good skin turgor.  Lab results:  [facility] 11/26/11 0553  NA 124*  K 4.5  CL 89*  CO2 23  GLUCOSE 127*  BUN 16  CREATININE 1.05  CALCIUM 9.3  MG --  PHOS --   No results found for this basename: AST:2,ALT:2,ALKPHOS:2,BILITOT:2,PROT:2,ALBUMIN:2 in the last 72 hours No results found for this basename: LIPASE:2,AMYLASE:2 in the last 72 hours  Basename 11/26/11 0553  WBC 10.0  NEUTROABS --  HGB 12.0*  HCT 34.3*  MCV 82.7  PLT 204   No results found for this basename: CKTOTAL:3,CKMB:3,CKMBINDEX:3,TROPONINI:3 in the last 72 hours No components found with this basename: POCBNP:3 No results found for this basename: DDIMER:2 in the last 72 hours No results found for this basename: HGBA1C:2 in the last 72 hours No results found for this basename: CHOL:2,HDL:2,LDLCALC:2,TRIG:2,CHOLHDL:2,LDLDIRECT:2 in the last 72 hours No results found for this basename: TSH,T4TOTAL,FREET3,T3FREE,THYROIDAB in the last 72 hours No results found for this basename: VITAMINB12:2,FOLATE:2,FERRITIN:2,TIBC:2,IRON:2,RETICCTPCT:2 in the last 72 hours Imaging results:  Dg Chest 2 View  11/26/2011  *RADIOLOGY REPORT*  Clinical Data: Shortness of breath.  CHEST - 2 VIEW  Comparison: 02/01/2011  Findings: Stable appearance of postoperative changes in the mediastinum and cardiac pacemaker.  Mild cardiac enlargement with normal pulmonary vascularity.  New blunting of costophrenic angles suggest small effusions.  Interstitial changes could represent fibrosis or interstitial edema.  Pleural calcifications on the right.  Calcification of the aorta.  No  pneumothorax.  IMPRESSION: Cardiac enlargement without significant vascular congestion.  Small pleural effusions.  Interstitial edema versus fibrosis.   Original Report Authenticated By: Marlon Pel, M.D.    Other results: EKG: normal EKG, normal sinus rhythm with paced rhythm.   Patient Active Hospital Problem List: Acute on chronic systolic congestive heart failure (11/26/2011)   Assessment: Pt presents with increased DOE. He does have an elevated BNP and clinical findings of mixed consistancy with an acute exacerbation of CHF, however the historical course of his presenting symptoms leaves room for pulmonary causes of his DOE. In addition, the patient appears euvolemic.   Plan: In light of the increased BNP, I will start IV lasix. However if no significant improvement in 24 hours, will pursue evaluating for other causes including PE.  Hyponatremia (11/26/2011)   Assessment:  My assessment is that this is resultant from diuretics and possibly hemodilution. Unfortunately, I have no urine  evaluation prior to administration of Lasix, thus urine studies at this time would be less helpful in evluaating the patient. The pt is mentally alert and shows no concerning  clinical symptoms related to hyponatremia.   Plan: will continue to monitor.  PACEMAKER, PERMANENT (01/19/2009)   Assessment: EKG indicates good capture.    BPH (benign prostatic hyperplasia) ()   Assessment: Continue avodart    Dyspnea on exertion (11/26/2011)   Assessment: See above.    DVT prophylaxis: Lovenox  Total time for initial evaluation approximately 45 minutes.  MATTHEWS,MICHELLE A. 11/26/2011, 8:46 AM

## 2011-11-27 LAB — BASIC METABOLIC PANEL
CO2: 28 mEq/L (ref 19–32)
Chloride: 89 mEq/L — ABNORMAL LOW (ref 96–112)
GFR calc Af Amer: 62 mL/min — ABNORMAL LOW (ref >90)
Potassium: 4 mEq/L (ref 3.5–5.1)

## 2011-11-27 LAB — PRO B NATRIURETIC PEPTIDE: Pro B Natriuretic peptide (BNP): 8739 pg/mL — ABNORMAL HIGH (ref 0–450)

## 2011-11-27 NOTE — Progress Notes (Signed)
Subjective:  Patient feels better this am.  Less dyspneic. Weight down 2 lb.  Objective:  Vital Signs in the last 24 hours: Temp:  [97.4 F (36.3 C)-98.2 F (36.8 C)] 98 F (36.7 C) (10/19 0610) Pulse Rate:  [58-98] 78  (10/19 0610) Resp:  [18-26] 20  (10/19 0610) BP: (108-144)/(43-81) 129/63 mmHg (10/19 0610) SpO2:  [95 %-99 %] 99 % (10/19 0610) Weight:  [145 lb 15.1 oz (66.2 kg)-147 lb 11.3 oz (67 kg)] 145 lb 15.1 oz (66.2 kg) (10/19 0500)  Intake/Output from previous day: 10/18 0701 - 10/19 0700 In: 720 [P.O.:720] Out: 3160 [Urine:3160] Intake/Output from this shift:       . aspirin EC  81 mg Oral Daily  . carvedilol  12.5 mg Oral BID WC  . cephALEXin  250 mg Oral Daily  . docusate sodium  100 mg Oral BID  . doxazosin  4 mg Oral Daily  . dutasteride  0.5 mg Oral Daily  . enoxaparin (LOVENOX) injection  40 mg Subcutaneous Q24H  . furosemide  40 mg Intravenous BID  . influenza  inactive virus vaccine  0.5 mL Intramuscular Tomorrow-1000  . multivitamin with minerals  1 tablet Oral Daily  . pantoprazole  40 mg Oral Daily  . potassium chloride SA  20 mEq Oral Daily  . ramipril  10 mg Oral Daily  . simvastatin  20 mg Oral QHS  . sodium chloride  3 mL Intravenous Q12H  . sodium chloride  3 mL Intravenous Q12H      Physical Exam: The patient appears to be in no distress.  Head and neck exam reveals that the pupils are equal and reactive.  The extraocular movements are full.  There is no scleral icterus.  Mouth and pharynx are benign.  No lymphadenopathy.  No carotid bruits.  The jugular venous pressure is normal.  Thyroid is not enlarged or tender.  Chest minimal rales at bases. No wheezing  Heart reveals no abnormal lift or heave.  First and second heart sounds are normal.  There is no  gallop rub or click. Gr 1/6 systolic basilar murmur.  The abdomen is soft and nontender.  Bowel sounds are normoactive.  There is no hepatosplenomegaly or mass.  There are no  abdominal bruits.  Extremities reveal no phlebitis or edema.  Pedal pulses are good.  There is no cyanosis or clubbing.  Neurologic exam is normal strength and no lateralizing weakness.  No sensory deficits.  Integument reveals no rash  Lab Results:  Basename 11/26/11 1013 11/26/11 0553  WBC 8.9 10.0  HGB 12.2* 12.0*  PLT 210 204    Basename 11/27/11 0455 11/26/11 1013 11/26/11 0553  NA 127* -- 124*  K 4.0 -- 4.5  CL 89* -- 89*  CO2 28 -- 23  GLUCOSE 106* -- 127*  BUN 17 -- 16  CREATININE 1.17 1.06 --    Basename 11/26/11 2044 11/26/11 1445  TROPONINI <0.30 <0.30   Hepatic Function Panel No results found for this basename: PROT,ALBUMIN,AST,ALT,ALKPHOS,BILITOT,BILIDIR,IBILI in the last 72 hours No results found for this basename: CHOL in the last 72 hours No results found for this basename: PROTIME in the last 72 hours  Imaging: Dg Chest 2 View  11/26/2011  *RADIOLOGY REPORT*  Clinical Data: Shortness of breath.  CHEST - 2 VIEW  Comparison: 02/01/2011  Findings: Stable appearance of postoperative changes in the mediastinum and cardiac pacemaker.  Mild cardiac enlargement with normal pulmonary vascularity.  New blunting of costophrenic angles suggest  small effusions.  Interstitial changes could represent fibrosis or interstitial edema.  Pleural calcifications on the right.  Calcification of the aorta.  No pneumothorax.  IMPRESSION: Cardiac enlargement without significant vascular congestion.  Small pleural effusions.  Interstitial edema versus fibrosis.   Original Report Authenticated By: Marlon Pel, M.D.     Cardiac Studies: Telemetry reviewed.  Ventricular pacing.  Occasional PVCs Assessment/Plan:  Patient Active Hospital Problem List:  Acute on chronic systolic congestive heart failure (11/26/2011)   Assessment: Weight down 2 lb   Plan: Continue IV lasix today, convert to po in am Hyponatremia (11/26/2011)   Assessment: Improved slightly   Plan: Continue  fluid restriction. Anxious to go home.  Possible home Sunday.    LOS: 1 day    Cassell Clement 11/27/2011, 8:03 AM

## 2011-11-27 NOTE — Progress Notes (Signed)
[facility] PROGRESS NOTE  Bill Cunningham UJW:[id number] DOB: [birth year] DOA: 11/26/2011 PCP: Beverley Fiedler, MD  Assessment/Plan: Active Problems:  PACEMAKER, PERMANENT  BPH (benign prostatic hyperplasia)  Acute on chronic systolic congestive heart failure  Hyponatremia  Dyspnea on exertion  1. Hypoxemia:Pt presents with hypoxemia which is most likely secondary to volume overload. He has now admitted to excessive salt intake.  He has responded to diuresis with a 2# weight loss in the last 24 hours. Will continue with IV  Diuretics today  and transition to oral lasix tomorrow. D-dimer negative so no further work-up for  2. Hyponatremia: Improved with diuresis. 3. Dyspnea on exertion: This appears to have improved with diuresis. We'll check an administered pulse ox on the patient.  Code Status: Full Family Communication: Updated wife and daughter at the bedside. Disposition Plan: Home at time of discharge  Bill Cunningham A.  [facility] Pager [phone]. If 8PM-8AM, please contact night-coverage at [email or web address], password [facility] 11/27/2011, 7:33 AM  LOS: 1 day   Brief narrative: Pt is an elderly gentle man with a history of CAD S/P CABG ,valve replacement, and Chronic systolic heart failure who got up to go to the bathroom on the morning of admission and had an episode of severe DOE. He states that he has a baseline SOB with activity secondary to his cardiac issues, but this was of sudden onset and more intense than usual. He states that he has intermittet PND but has not had any orthopnea and is still able to sleep flat on 1 pillow which he demonstrated during the examination. He does however also demonstrate increased SOB with only minimal activity. The pateint also reports that he weighs himself about every other day, and that his weight has not changed in the last week. He also is adherent to a fluid restriction of 48 oz of fluid/day.   [facility]  Procedures:  None  Antibiotics:  None  HPI/Subjective: Patient anxious to be discharged home. Discussed with the patient wife and daughter that likely be a discharge maybe tomorrow.  Objective: Filed Vitals:   11/26/11 1723 11/26/11 2137 11/27/11 0500 11/27/11 0610  BP: 108/43 111/57  129/63  Pulse: 62 61  78  Temp:  97.4 F (36.3 C)  98 F (36.7 C)  TempSrc:  Oral  Oral  Resp:  20  20  Height:      Weight:   66.2 kg (145 lb 15.1 oz)   SpO2:  98%  99%   Weight change:   Intake/Output Summary (Last 24 hours) at 11/27/11 0733 Last data filed at 11/27/11 0300  Gross per 24 hour  Intake    720 ml  Output   3160 ml  Net  -2440 ml    General: Alert, awake, oriented x3, in no acute distress.  HEENT: [place]/AT PEERL, EOMI Neck: Trachea midline,  no masses, no thyromegal,y no JVD, no carotid bruit OROPHARYNX:  Moist, No exudate/ erythema/lesions.  Heart: Regular rate and rhythm, 2/6 systolic ejection murmur. No rubs or gallops.  Lungs: Clear to auscultation, no wheezing or rhonchi noted. Abdomen: Soft, nontender, nondistended, positive bowel sounds, no masses no hepatosplenomegaly noted..     Data Reviewed: Basic Metabolic Panel:  Lab 11/27/11 6578 11/26/11 1013 11/26/11 0553  NA 127* -- 124*  K 4.0 -- 4.5  CL 89* -- 89*  CO2 28 -- 23  GLUCOSE 106* -- 127*  BUN 17 -- 16  CREATININE 1.17 1.06 1.05  CALCIUM 9.1 --  9.3  MG -- -- --  PHOS -- -- --   Liver Function Tests: No results found for this basename: AST:5,ALT:5,ALKPHOS:5,BILITOT:5,PROT:5,ALBUMIN:5 in the last 168 hours No results found for this basename: LIPASE:5,AMYLASE:5 in the last 168 hours No results found for this basename: AMMONIA:5 in the last 168 hours CBC:  Lab 11/26/11 1013 11/26/11 0553  WBC 8.9 10.0  NEUTROABS -- --  HGB 12.2* 12.0*  HCT 35.1* 34.3*  MCV 83.0 82.7  PLT 210 204   Cardiac Enzymes:  Lab 11/26/11 2044 11/26/11 1445 11/26/11 1013  CKTOTAL -- -- --  CKMB  -- -- --  CKMBINDEX -- -- --  TROPONINI <0.30 <0.30 <0.30   BNP (last 3 results)  Basename 11/27/11 0455 11/26/11 0553  PROBNP 8739.0* 8917.0*   CBG: No results found for this basename: GLUCAP:5 in the last 168 hours  No results found for this or any previous visit (from the past 240 hour(s)).   Studies: Dg Chest 2 View  11/26/2011  *RADIOLOGY REPORT*  Clinical Data: Shortness of breath.  CHEST - 2 VIEW  Comparison: 02/01/2011  Findings: Stable appearance of postoperative changes in the mediastinum and cardiac pacemaker.  Mild cardiac enlargement with normal pulmonary vascularity.  New blunting of costophrenic angles suggest small effusions.  Interstitial changes could represent fibrosis or interstitial edema.  Pleural calcifications on the right.  Calcification of the aorta.  No pneumothorax.  IMPRESSION: Cardiac enlargement without significant vascular congestion.  Small pleural effusions.  Interstitial edema versus fibrosis.   Original Report Authenticated By: Marlon Pel, M.D.     Scheduled Meds:   . aspirin EC  81 mg Oral Daily  . carvedilol  12.5 mg Oral BID WC  . cephALEXin  250 mg Oral Daily  . docusate sodium  100 mg Oral BID  . doxazosin  4 mg Oral Daily  . dutasteride  0.5 mg Oral Daily  . enoxaparin (LOVENOX) injection  40 mg Subcutaneous Q24H  . furosemide  40 mg Intravenous BID  . influenza  inactive virus vaccine  0.5 mL Intramuscular Tomorrow-1000  . multivitamin with minerals  1 tablet Oral Daily  . pantoprazole  40 mg Oral Daily  . potassium chloride SA  20 mEq Oral Daily  . ramipril  10 mg Oral Daily  . simvastatin  20 mg Oral QHS  . sodium chloride  3 mL Intravenous Q12H  . sodium chloride  3 mL Intravenous Q12H   Continuous Infusions:   Active Problems:  PACEMAKER, PERMANENT  BPH (benign prostatic hyperplasia)  Acute on chronic systolic congestive heart failure  Hyponatremia  Dyspnea on exertion

## 2011-11-27 NOTE — Progress Notes (Signed)
Pt woke up confused. Pt A&O x 4. Neuro check WNL. Pt states "I remember everything. I just don't recognize this room." RN called pt's daughter. Pt spoke on the phone with daughter. Pt is now resting comfortably in bed. Will continue to monitor. - Christell Faith, RN

## 2011-11-28 LAB — COMPREHENSIVE METABOLIC PANEL WITH GFR
ALT: 10 U/L (ref 0–53)
AST: 16 U/L (ref 0–37)
Albumin: 4 g/dL (ref 3.5–5.2)
Alkaline Phosphatase: 57 U/L (ref 39–117)
BUN: 20 mg/dL (ref 6–23)
CO2: 32 meq/L (ref 19–32)
Calcium: 9.4 mg/dL (ref 8.4–10.5)
Chloride: 87 meq/L — ABNORMAL LOW (ref 96–112)
Creatinine, Ser: 1.27 mg/dL (ref 0.50–1.35)
GFR calc Af Amer: 56 mL/min — ABNORMAL LOW
GFR calc non Af Amer: 49 mL/min — ABNORMAL LOW
Glucose, Bld: 131 mg/dL — ABNORMAL HIGH (ref 70–99)
Potassium: 4.1 meq/L (ref 3.5–5.1)
Sodium: 128 meq/L — ABNORMAL LOW (ref 135–145)
Total Bilirubin: 0.6 mg/dL (ref 0.3–1.2)
Total Protein: 7.1 g/dL (ref 6.0–8.3)

## 2011-11-28 MED ORDER — FUROSEMIDE 40 MG PO TABS
40.0000 mg | ORAL_TABLET | Freq: Every day | ORAL | Status: DC
  Administered 2011-11-28: 40 mg via ORAL
  Filled 2011-11-28: qty 1

## 2011-11-28 MED ORDER — NITROGLYCERIN 0.4 MG SL SUBL: 0.4000 mg | SUBLINGUAL_TABLET | SUBLINGUAL | Status: DC | PRN

## 2011-11-28 NOTE — Progress Notes (Signed)
Subjective:  Patient feels better this am.  Less dyspneic. Weight down further overnight. No labs available yet this am.  Objective:  Vital Signs in the last 24 hours: Temp:  [97.9 F (36.6 C)-99.3 F (37.4 C)] 99.3 F (37.4 C) (10/20 0537) Pulse Rate:  [59-74] 69  (10/20 0537) Resp:  [18-20] 18  (10/20 0537) BP: (106-129)/(56-75) 119/75 mmHg (10/20 0537) SpO2:  [94 %-97 %] 97 % (10/20 0537) Weight:  [140 lb 10.5 oz (63.8 kg)] 140 lb 10.5 oz (63.8 kg) (10/20 0749)  Intake/Output from previous day: 10/19 0701 - 10/20 0700 In: 720 [P.O.:720] Out: 1250 [Urine:1250] Intake/Output from this shift:       . aspirin EC  81 mg Oral Daily  . carvedilol  12.5 mg Oral BID WC  . cephALEXin  250 mg Oral Daily  . docusate sodium  100 mg Oral BID  . doxazosin  4 mg Oral Daily  . dutasteride  0.5 mg Oral Daily  . enoxaparin (LOVENOX) injection  40 mg Subcutaneous Q24H  . furosemide  40 mg Oral Daily  . influenza  inactive virus vaccine  0.5 mL Intramuscular Tomorrow-1000  . multivitamin with minerals  1 tablet Oral Daily  . pantoprazole  40 mg Oral Daily  . potassium chloride SA  20 mEq Oral Daily  . ramipril  10 mg Oral Daily  . simvastatin  20 mg Oral QHS  . sodium chloride  3 mL Intravenous Q12H  . sodium chloride  3 mL Intravenous Q12H  . DISCONTD: furosemide  40 mg Intravenous BID      Physical Exam: The patient appears to be in no distress.  Head and neck exam reveals that the pupils are equal and reactive.  The extraocular movements are full.  There is no scleral icterus.  Mouth and pharynx are benign.  No lymphadenopathy.  No carotid bruits.  The jugular venous pressure is normal.  Thyroid is not enlarged or tender.  Chest minimal rales at left base. No wheezing.  Heart reveals no abnormal lift or heave.  First and second heart sounds are normal.  There is no  gallop rub or click. Gr 1/6 systolic basilar murmur.  The abdomen is soft and nontender.  Bowel sounds are  normoactive.  There is no hepatosplenomegaly or mass.  There are no abdominal bruits.  Extremities reveal no phlebitis or edema.  Pedal pulses are good.  There is no cyanosis or clubbing.  Neurologic exam is normal strength and no lateralizing weakness.  No sensory deficits.  Integument reveals no rash  Lab Results:  Basename 11/26/11 1013 11/26/11 0553  WBC 8.9 10.0  HGB 12.2* 12.0*  PLT 210 204    Basename 11/27/11 0455 11/26/11 1013 11/26/11 0553  NA 127* -- 124*  K 4.0 -- 4.5  CL 89* -- 89*  CO2 28 -- 23  GLUCOSE 106* -- 127*  BUN 17 -- 16  CREATININE 1.17 1.06 --    Basename 11/26/11 2044 11/26/11 1445  TROPONINI <0.30 <0.30   Hepatic Function Panel No results found for this basename: PROT,ALBUMIN,AST,ALT,ALKPHOS,BILITOT,BILIDIR,IBILI in the last 72 hours No results found for this basename: CHOL in the last 72 hours No results found for this basename: PROTIME in the last 72 hours  Imaging: No results found.  Cardiac Studies: Telemetry reviewed.  AV pacing. Some A-sensing, V pacing. No significant arrhythmias Assessment/Plan:  Patient Active Hospital Problem List:  Acute on chronic systolic congestive heart failure (11/26/2011)   Assessment: Improved  Plan: Have converted back to oral lasix 40 mg home dose. Hyponatremia (11/26/2011)   Assessment: No labs yet today. Will order BMET   Plan: Continue fluid restriction. Anxious to go home. Okay for discharge today from cardiac standpoint if labs satisfactory. He should follow up with Dr. Myrtis Ser or PA/NP in 1 week for OV and BMET. I counseled him about the importance of low salt diet. Family on board with that.    LOS: 2 days    Cassell Clement 11/28/2011, 7:56 AM

## 2011-11-28 NOTE — Discharge Summary (Signed)
Physician Discharge Summary  BAER HINTON MRN: [id number] DOB/AGE: 1921-11-10 78 y.o.  PCP: Beverley Fiedler, MD   Admit date: 11/26/2011 Discharge date: 11/28/2011  Discharge Diagnoses:    Acute on chronic systolic congestive heart failure    PACEMAKER, PERMANENT  BPH (benign prostatic hyperplasia)  Acute on chronic systolic congestive heart failure  Hyponatremia  Dyspnea on exertion     Medication List     As of 11/28/2011  9:02 AM    TAKE these medications         aspirin EC 81 MG tablet   Take 81 mg by mouth daily.      AVODART 0.5 MG capsule   Generic drug: dutasteride   Take 1 tablet by mouth Daily.      Benefiber Powd   Take by mouth daily.      carvedilol 12.5 MG tablet   Commonly known as: COREG   Take 1 tablet (12.5 mg total) by mouth 2 (two) times daily with a meal.      cephALEXin 250 MG capsule   Commonly known as: KEFLEX   Take 250 mg by mouth daily.      docusate sodium 100 MG capsule   Commonly known as: COLACE   Take 100 mg by mouth 2 (two) times daily.      doxazosin 4 MG tablet   Commonly known as: CARDURA   Take 4 mg by mouth daily.      furosemide 40 MG tablet   Commonly known as: LASIX   Take 1 tablet (40 mg total) by mouth daily.      multivitamin tablet   Take 1 tablet by mouth daily.      omeprazole 20 MG capsule   Commonly known as: PRILOSEC   Take 1 capsule (20 mg total) by mouth daily.      potassium chloride SA 20 MEQ tablet   Commonly known as: K-DUR,KLOR-CON   Take 1 tablet (20 mEq total) by mouth daily.      ramipril 10 MG capsule   Commonly known as: ALTACE   Take 1 capsule (10 mg total) by mouth daily.      simvastatin 20 MG tablet   Commonly known as: ZOCOR   Take 1 tablet (20 mg total) by mouth at bedtime.      Zinc 50 MG Tabs   Take 50 mg by mouth daily.        Discharge Condition: Stable   Disposition:    Consults:  #1 cardiology  Significant Diagnostic Studies: Dg Chest 2  View  11/26/2011  *RADIOLOGY REPORT*  Clinical Data: Shortness of breath.  CHEST - 2 VIEW  Comparison: 02/01/2011  Findings: Stable appearance of postoperative changes in the mediastinum and cardiac pacemaker.  Mild cardiac enlargement with normal pulmonary vascularity.  New blunting of costophrenic angles suggest small effusions.  Interstitial changes could represent fibrosis or interstitial edema.  Pleural calcifications on the right.  Calcification of the aorta.  No pneumothorax.  IMPRESSION: Cardiac enlargement without significant vascular congestion.  Small pleural effusions.  Interstitial edema versus fibrosis.   Original Report Authenticated By: Marlon Pel, M.D.     Most recent 2-D echo 02/18/11  LV EF: 35% - 40%  ------------------------------------------------------------ Indications: CHF - 428.0.  ------------------------------------------------------------ History: PMH: Acquired from the patient and from the patient's chart. PMH: Chronic systolic heart failure. CAD. LBBB. Carotid Artery Disease. COPD. Risk factors: Former tobacco use. Dyslipidemia.  ------------------------------------------------------------ Study Conclusions  - Left ventricle: The  cavity size was moderately dilated. Wall thickness was normal. Systolic function was moderately reduced. The estimated ejection fraction was in the range of 35% to 40%. There is hypokinesis of the mid-distalanteroseptal myocardium. Doppler parameters are consistent with abnormal left ventricular relaxation (grade 1 diastolic dysfunction). - Aortic valve: A prosthesis was present and functioning normally. The prosthesis had a normal range of motion. The sewing ring appeared normal, had no rocking motion, and showed no evidence of dehiscence. There was mild stenosis. - Mitral valve: Mild regurgitation. - Left atrium: The atrium was mildly dilated.      Microbiology: No results found for this or any previous visit  (from the past 240 hour(s)).   Labs: Results for orders placed during the hospital encounter of 11/26/11 (from the past 48 hour(s))  CBC     Status: Abnormal   Collection Time   11/26/11 10:13 AM      Component Value Range Comment   WBC 8.9  4.0 - 10.5 K/uL    RBC 4.23  4.22 - 5.81 MIL/uL    Hemoglobin 12.2 (*) 13.0 - 17.0 g/dL    HCT 62.1 (*) 30.8 - 52.0 %    MCV 83.0  78.0 - 100.0 fL    MCH 28.8  26.0 - 34.0 pg    MCHC 34.8  30.0 - 36.0 g/dL    RDW 65.7  84.6 - 96.2 %    Platelets 210  150 - 400 K/uL   CREATININE, SERUM     Status: Abnormal   Collection Time   11/26/11 10:13 AM      Component Value Range Comment   Creatinine, Ser 1.06  0.50 - 1.35 mg/dL    GFR calc non Af Amer 61 (*) >90 mL/min    GFR calc Af Amer 70 (*) >90 mL/min   TROPONIN I     Status: Normal   Collection Time   11/26/11 10:13 AM      Component Value Range Comment   Troponin I <0.30  <0.30 ng/mL   TSH     Status: Normal   Collection Time   11/26/11 10:13 AM      Component Value Range Comment   TSH 1.401  0.350 - 4.500 uIU/mL   TROPONIN I     Status: Normal   Collection Time   11/26/11  2:45 PM      Component Value Range Comment   Troponin I <0.30  <0.30 ng/mL   D-DIMER, QUANTITATIVE     Status: Normal   Collection Time   11/26/11  4:10 PM      Component Value Range Comment   D-Dimer, Quant 0.43  0.00 - 0.48 ug/mL-FEU   TROPONIN I     Status: Normal   Collection Time   11/26/11  8:44 PM      Component Value Range Comment   Troponin I <0.30  <0.30 ng/mL   BASIC METABOLIC PANEL     Status: Abnormal   Collection Time   11/27/11  4:55 AM      Component Value Range Comment   Sodium 127 (*) 135 - 145 mEq/L    Potassium 4.0  3.5 - 5.1 mEq/L    Chloride 89 (*) 96 - 112 mEq/L    CO2 28  19 - 32 mEq/L    Glucose, Bld 106 (*) 70 - 99 mg/dL    BUN 17  6 - 23 mg/dL    Creatinine, Ser 9.52  0.50 - 1.35 mg/dL  Calcium 9.1  8.4 - 10.5 mg/dL    GFR calc non Af Amer 54 (*) >90 mL/min    GFR calc Af  Amer 62 (*) >90 mL/min   PRO B NATRIURETIC PEPTIDE     Status: Abnormal   Collection Time   11/27/11  4:55 AM      Component Value Range Comment   Pro B Natriuretic peptide (BNP) 8739.0 (*) 0 - 450 pg/mL      HPI : 78 y.o. male with a history of CAD. He was doing well then from a volume and an ischemic standpoint. He was admitted 2 days ago with significant DOE. There is concern for a CHF component so cardiology was asked to evaluate him.  Pt has otherwise been doing OK, no weight changes, small amount of daytime edema only. He has recently been struggling with an enlarged prostate, difficulty urinating and nocturia - therefore sleeping poorly.  Pt got up to BR at 3 am, when lying back down, had sudden onset SOB. It was severe but not associated with chest pain, diaphoresis or palpitations. No N&V. His SOB improved but did not get back to baseline. He thought about it and felt that he was still slightly SOB with exertion. This concerned him so he came in. He has given Lasix 80 mg and then 40 mg IV today. He had some LE edema this am, but this had improved. He still feels he has a little more DOE than usual, but is almost back to baseline. No wheeze, no PND, sinus congestion but no URI, no increase in cough (non-productive).   HOSPITAL COURSE:  #1 acute on chronic systolic heart failure. The patient responded well to IV diuretics. He has been changed to oral Lasix by cardiology. He has been counseled about dietary sodium intake His d-dimer was negative, chest x-ray showed small pleural effusions, Pro BNP was elevated at 8917  #2 hyponatremia Likely secondary to congestive heart failure, He has been advised about fluid restriction He will need a repeat BMP in one week     Discharge Exam:  Blood pressure 119/75, pulse 69, temperature 99.3 F (37.4 C), temperature source Oral, resp. rate 18, height 5\' 7"  (1.702 m), weight 63.8 kg (140 lb 10.5 oz), SpO2 97.00%.  General: Well developed,  well nourished, male in no acute distress  Head: Eyes PERRLA, No xanthomas. Normocephalic and atraumatic, oropharynx without edema or exudate. Dentition: poor  Lungs: bilateral rales, no rhonchi, no crackles  Heart: HRRR S1 S2, no rub/gallop, 2/6 murmur. pulses are 2+ all 4 extrem.  Neck: No carotid bruits. No lymphadenopathy. JVD at about 7 cm.  Abdomen: Bowel sounds present, abdomen soft and non-tender without masses or hernias noted.  Msk: No spine or cva tenderness. No weakness, no joint deformities or effusions.  Extremities: No clubbing or cyanosis. no edema.  Neuro: Alert and oriented X 3. No focal deficits noted.  Psych: Good affect, responds appropriately  Skin: No rashes or lesions noted.        Discharge Orders    Future Orders Please Complete By Expires   Diet - low sodium heart healthy      Increase activity slowly         Follow-up Information    Follow up with Beverley Fiedler, MD. Schedule an appointment as soon as possible for a visit in 1 week. (BMP in one week follow sodium)    Contact information:   [address] [place] [address] [phone]  Follow up with Willa Rough, MD. Schedule an appointment as soon as possible for a visit in 1 week. (Followup for congestive heart failure)    Contact information:   [address] [place] [address] [phone]          Signed: Richarda Overlie 11/28/2011, 9:02 AM

## 2011-12-08 ENCOUNTER — Ambulatory Visit (INDEPENDENT_AMBULATORY_CARE_PROVIDER_SITE_OTHER): Payer: Medicare Other | Admitting: Physician Assistant

## 2011-12-08 ENCOUNTER — Encounter: Payer: Self-pay | Admitting: Physician Assistant

## 2011-12-08 VITALS — BP 107/58 | HR 61 | Ht 67.0 in | Wt 143.4 lb

## 2011-12-08 DIAGNOSIS — I2589 Other forms of chronic ischemic heart disease: Secondary | ICD-10-CM

## 2011-12-08 DIAGNOSIS — I5022 Chronic systolic (congestive) heart failure: Secondary | ICD-10-CM

## 2011-12-08 DIAGNOSIS — E871 Hypo-osmolality and hyponatremia: Secondary | ICD-10-CM

## 2011-12-08 DIAGNOSIS — I255 Ischemic cardiomyopathy: Secondary | ICD-10-CM

## 2011-12-08 DIAGNOSIS — N400 Enlarged prostate without lower urinary tract symptoms: Secondary | ICD-10-CM

## 2011-12-08 DIAGNOSIS — E785 Hyperlipidemia, unspecified: Secondary | ICD-10-CM

## 2011-12-08 DIAGNOSIS — I779 Disorder of arteries and arterioles, unspecified: Secondary | ICD-10-CM

## 2011-12-08 DIAGNOSIS — I10 Essential (primary) hypertension: Secondary | ICD-10-CM

## 2011-12-08 LAB — BASIC METABOLIC PANEL
BUN: 20 mg/dL (ref 6–23)
CO2: 28 mEq/L (ref 19–32)
Chloride: 96 mEq/L (ref 96–112)
Creatinine, Ser: 1.2 mg/dL (ref 0.4–1.5)

## 2011-12-08 NOTE — Assessment & Plan Note (Signed)
Patient has chronic trouble with BPH and is being treated for this.

## 2011-12-08 NOTE — Assessment & Plan Note (Signed)
Patient's last cholesterol was in April 2012. He will need this checked but is not fasting today. We'll schedule a later date.

## 2011-12-08 NOTE — Assessment & Plan Note (Signed)
Last sodium was 128 on 11/28/11. We will repeat today.

## 2011-12-08 NOTE — Assessment & Plan Note (Signed)
Patient had recent admission for acute on chronic congestive heart failure secondary to sodium indiscretion. He is well compensated today. He did have hyponatremia in the hospital. We will followup with labs today. Continue 2 g sodium diet.

## 2011-12-08 NOTE — Patient Instructions (Addendum)
Your physician recommends that you return for lab work in: today (bmet) and fasting (lipid)  Your physician recommends that you schedule a follow-up appointment in: 2 months with Dr Myrtis Ser   2 Gram Low Sodium Diet A 2 gram sodium diet restricts the amount of sodium in the diet to no more than 2 g or 2000 mg daily. Limiting the amount of sodium is often used to help lower blood pressure. It is important if you have heart, liver, or kidney problems. Many foods contain sodium for flavor and sometimes as a preservative. When the amount of sodium in a diet needs to be low, it is important to know what to look for when choosing foods and drinks. The following includes some information and guidelines to help make it easier for you to adapt to a low sodium diet. QUICK TIPS  Do not add salt to food.  Avoid convenience items and fast food.  Choose unsalted snack foods.  Buy lower sodium products, often labeled as "lower sodium" or "no salt added."  Check food labels to learn how much sodium is in 1 serving.  When eating at a restaurant, ask that your food be prepared with less salt or none, if possible. READING FOOD LABELS FOR SODIUM INFORMATION The nutrition facts label is a good place to find how much sodium is in foods. Look for products with no more than 500 to 600 mg of sodium per meal and no more than 150 mg per serving. Remember that 2 g = 2000 mg. The food label may also list foods as:  Sodium-free: Less than 5 mg in a serving.  Very low sodium: 35 mg or less in a serving.  Low-sodium: 140 mg or less in a serving.  Light in sodium: 50% less sodium in a serving. For example, if a food that usually has 300 mg of sodium is changed to become light in sodium, it will have 150 mg of sodium.  Reduced sodium: 25% less sodium in a serving. For example, if a food that usually has 400 mg of sodium is changed to reduced sodium, it will have 300 mg of sodium. CHOOSING FOODS Grains  Avoid: Salted  crackers and snack items. Some cereals, including instant hot cereals. Bread stuffing and biscuit mixes. Seasoned rice or pasta mixes.  Choose: Unsalted snack items. Low-sodium cereals, oats, puffed wheat and rice, shredded wheat. English muffins and bread. Pasta. Meats  Avoid: Salted, canned, smoked, spiced, pickled meats, including fish and poultry. Bacon, ham, sausage, cold cuts, hot dogs, anchovies.  Choose: Low-sodium canned tuna and salmon. Fresh or frozen meat, poultry, and fish. Dairy  Avoid: Processed cheese and spreads. Cottage cheese. Buttermilk and condensed milk. Regular cheese.  Choose: Milk. Low-sodium cottage cheese. Yogurt. Sour cream. Low-sodium cheese. Fruits and Vegetables  Avoid: Regular canned vegetables. Regular canned tomato sauce and paste. Frozen vegetables in sauces. Olives. Rosita Fire. Relishes. Sauerkraut.  Choose: Low-sodium canned vegetables. Low-sodium tomato sauce and paste. Frozen or fresh vegetables. Fresh and frozen fruit. Condiments  Avoid: Canned and packaged gravies. Worcestershire sauce. Tartar sauce. Barbecue sauce. Soy sauce. Steak sauce. Ketchup. Onion, garlic, and table salt. Meat flavorings and tenderizers.  Choose: Fresh and dried herbs and spices. Low-sodium varieties of mustard and ketchup. Lemon juice. Tabasco sauce. Horseradish. SAMPLE 2 GRAM SODIUM MEAL PLAN Breakfast / Sodium (mg)  1 cup low-fat milk / 143 mg  2 slices whole-wheat toast / 270 mg  1 tbs heart-healthy margarine / 153 mg  1 hard-boiled egg /  139 mg  1 small orange / 0 mg Lunch / Sodium (mg)  1 cup raw carrots / 76 mg   cup hummus / 298 mg  1 cup low-fat milk / 143 mg   cup red grapes / 2 mg  1 whole-wheat pita bread / 356 mg Dinner / Sodium (mg)  1 cup whole-wheat pasta / 2 mg  1 cup low-sodium tomato sauce / 73 mg  3 oz lean ground beef / 57 mg  1 small side salad (1 cup raw spinach leaves,  cup cucumber,  cup yellow bell pepper) with 1 tsp olive  oil and 1 tsp red wine vinegar / 25 mg Snack / Sodium (mg)  1 container low-fat vanilla yogurt / 107 mg  3 graham cracker squares / 127 mg Nutrient Analysis  Calories: 2033  Protein: 77 g  Carbohydrate: 282 g  Fat: 72 g  Sodium: 1971 mg Document Released: 01/25/2005 Document Revised: 04/19/2011 Document Reviewed: 04/28/2009 [facility] Patient Information 2013 [place], [place].

## 2011-12-08 NOTE — Assessment & Plan Note (Signed)
For followup Doppler is in January

## 2011-12-08 NOTE — Assessment & Plan Note (Signed)
Blood pressure stable ? ?

## 2011-12-08 NOTE — Progress Notes (Signed)
HPI:   This is an 78 year old white male patient of Dr. Willa Rough who has history of coronary artery disease status post CABG and aortic valve replacement in April 2011, ischemic cardiomyopathy 35-40% on 2-D echo on 02/18/11, status post pacemaker, and hypertension.  The patient is here today for post hospital follow-up. He was admitted on 11/26/11 with acute on chronic congestive heart failure. He was also noted to be hyponatremic. He had been eating a lot of canned foods and adding salt to his foods.  Since the patient's been home he is slowly regaining his strength back. He was able to rake leaves yesterday without too much trouble. He has really cut back on his sodium intake. He denies dyspnea, edema, dizziness, or presyncope. He has occasional dyspnea on exertion if he over exerts himself. He also has trouble with an enlarged prostate and chronic UTIs for which he takes Keflex for.  Allergies:  -- Niaspan (Niacin Er) -- Other (See Comments)   --  Unknown reaction  -- Sulfonamide Derivatives -- Other (See Comments)   --  Unknown reaction  Current Outpatient Prescriptions on File Prior to Visit: aspirin EC 81 MG tablet, Take 81 mg by mouth daily., Disp: , Rfl:  AVODART 0.5 MG capsule, Take 1 tablet by mouth Daily., Disp: , Rfl:  carvedilol (COREG) 12.5 MG tablet, Take 1 tablet (12.5 mg total) by mouth 2 (two) times daily with a meal., Disp: 60 tablet, Rfl: 6 cephALEXin (KEFLEX) 250 MG capsule, Take 250 mg by mouth daily., Disp: , Rfl:  docusate sodium (COLACE) 100 MG capsule, Take 100 mg by mouth 2 (two) times daily.  , Disp: , Rfl:  doxazosin (CARDURA) 4 MG tablet, Take 4 mg by mouth daily.  , Disp: , Rfl:  furosemide (LASIX) 40 MG tablet, Take 1 tablet (40 mg total) by mouth daily., Disp: 90 tablet, Rfl: 3 Multiple Vitamin (MULTIVITAMIN) tablet, Take 1 tablet by mouth daily.  , Disp: , Rfl:  nitroGLYCERIN (NITROSTAT) 0.4 MG SL tablet, Place 1 tablet (0.4 mg total) under the tongue every  5 (five) minutes as needed for chest pain., Disp: 30 tablet, Rfl: 12 omeprazole (PRILOSEC) 20 MG capsule, Take 1 capsule (20 mg total) by mouth daily., Disp: 90 capsule, Rfl: 3 potassium chloride SA (K-DUR,KLOR-CON) 20 MEQ tablet, Take 1 tablet (20 mEq total) by mouth daily., Disp: 90 tablet, Rfl: 3 ramipril (ALTACE) 10 MG capsule, Take 1 capsule (10 mg total) by mouth daily., Disp: 90 capsule, Rfl: 2 simvastatin (ZOCOR) 20 MG tablet, Take 1 tablet (20 mg total) by mouth at bedtime., Disp: 90 tablet, Rfl: 3 Wheat Dextrin (BENEFIBER) POWD, Take by mouth daily.  , Disp: , Rfl:  Zinc 50 MG TABS, Take 50 mg by mouth daily.  , Disp: , Rfl:     Past Medical History:   Hyperlipidemia                                               LBBB (left bundle branch block)                              Depression  COPD (chronic obstructive pulmonary disease)                 Calcium oxalate renal stones                                 Dizziness                                                    GERD (gastroesophageal reflux disease)                       Diverticulosis                                               IBS (irritable bowel syndrome)                               Leg cramps                                                     Comment:not vascular in origin    Liver cyst                                                   Wide-complex tachycardia                                       Comment:post op - on a beta blocker    Sinus congestion                                             Bleeding nose                                                  Comment:cauteriszed dr Lazarus Salines  / cauterization               repeated 10/11   Coronary artery disease                                        Comment:Nuclear June 03, 2010, scar anterior wall apex              and inferior wall, no definite ischemia,    not              gated   Intolerance of drug  Comment:Niaspan   S/P aortic valve replacement with bioprostheti*                Comment:echo.[name] April, 2011... good valve function...               mild AI   S/P CABG (coronary artery bypass graft)         January, *   LV dysfunction                                                 Comment:EF 45% / EF 25-30%, echo, April, 2011 / EF               25-30%, echo, April, 2012, akinesis of the               inferior and posterior walls   Ischemic cardiomyopathy                                        Comment:EF followed carefully   Carotid artery disease                                         Comment:Doppler, 2011, bilateral 40-59%    /   Doppler,              March, 2013, no change, bilateral 40-59%   BPH (benign prostatic hyperplasia)                             Comment:2013  Past Surgical History:   CHOLECYSTECTOMY                                              AORTIC VALVE REPLACEMENT                        02/2005       INSERT / REPLACE / REMOVE PACEMAKER                          CORONARY ARTERY BYPASS GRAFT                                   Comment:x5  -- last one in 07   LITHOTRIPSY                                                 No family history on file.   Social History   Marital Status: Married             Spouse Name:                      Years of Education:  Number of children:             Occupational History Occupation          Associate Professor            Comment              Retired Radiographer, therapeutic                              Social History Main Topics   Smoking Status: Former Smoker                   Packs/Day:       Years:           Quit date: 02/08/1954   Smokeless Status: Not on file                      Alcohol Use: No             Drug Use: No             Sexual Activity: Not on file        Other Topics            Concern   None on file  Social History Narrative   No CAD known in any siblings.      WUJ:WJXB of hearing wears  a hearing aid, arthritis, otherwise see history of present illness   PHYSICAL EXAM: Well-nournished, in no acute distress. Neck: bilateral carotid bruits,No JVD, HJR, or thyroid enlargement  Lungs: No tachypnea, clear without wheezing, rales, or rhonchi  Cardiovascular: RRR, PMI not displaced,positive S4 and 1-2/6 systolic ejection murmur at the left sternal border, no bruit, thrill, or heave.  Abdomen: BS normal. Soft without organomegaly, masses, lesions or tenderness.  Extremities: without cyanosis, clubbing or edema. Good distal pulses bilateral  SKin: Warm, no lesions or rashes   Musculoskeletal: No deformities  Neuro: no focal signs  BP 107/58  Pulse 61  Ht 5\' 7"  (1.702 m)  Wt 143 lb 6.4 oz (65.046 kg)  BMI 22.46 kg/m2

## 2011-12-09 NOTE — H&P (Signed)
Please note that this H&P was filed on 11/26/2011 at 8:45 AM under the incorrect having a progress note. This is a copy that is being refiled followed under the correct heading of H&P.   Hospital Admission Note  Date: 11/26/2011   Patient name: Bill Cunningham         Medical record number: [id number]  Date of birth: 1921-03-29 Age: 78 y.o. Gender: male  PCP: Beverley Fiedler, MD    Attending physician: Altha Harm, MD    Chief Complaint:Dyspnea on exertion.   of Present Illness: Pt is an elderly gentle man with a history of CAD S/P CABG ,valve replacement, and Chronic systolic heart failure who got up to go to the bathroom this morning and had an episode of severe DOE. He states that he has a baseline SOB with activity secondary to his cardiac issues, but this was of sudden onset and more intense than usual. He states that he has intermittet PND but has not had any orthopnea and is still able to sleep flat on 1 pillow which he demonstrated during the examination. He does however also demonstrate increased SOB with only minimal activity. The pateint also reports that he weighs himself about every other day, and that his weight has not changed in the last week. He also is adherent to a fluid restriction of 48 oz of fluid/day.   He denies any chest pain, fever, chills, nausea, vomiting or diarrhea.   Scheduled Meds:  .  furosemide  80 mg  Intravenous  Once      Allergies:  Niaspan and Sulfonamide derivatives  Past Medical History   Diagnosis  Date   .  Hyperlipidemia    .  LBBB (left bundle branch block)    .  Depression    .  COPD (chronic obstructive pulmonary disease)    .  Calcium oxalate renal stones    .  Dizziness    .  GERD (gastroesophageal reflux disease)    .  Diverticulosis    .  IBS (irritable bowel syndrome)    .  Leg cramps      not vascular in origin   .  Liver cyst    .  Wide-complex tachycardia      post op - on a beta blocker   .  Sinus congestion      .  Bleeding nose      cauteriszed dr Lazarus Salines / cauterization repeated 10/11   .  Coronary artery disease      Nuclear June 03, 2010, scar anterior wall apex and inferior wall, no definite ischemia, not gated   .  Intolerance of drug      Niaspan   .  S/P aortic valve replacement with bioprosthetic valve      echo.[name] April, 2011... good valve function... mild AI   .  S/P CABG (coronary artery bypass graft)  January, 2007   .  LV dysfunction      EF 45% / EF 25-30%, echo, April, 2011 / EF 25-30%, echo, April, 2012, akinesis of the inferior and posterior walls   .  Ischemic cardiomyopathy      EF followed carefully   .  Carotid artery disease      Doppler, 2011, bilateral 40-59% / Doppler, March, 2013, no change, bilateral 40-59%   .  BPH (benign prostatic hyperplasia)      2013    Past Surgical History   Procedure  Date   .  Cholecystectomy    .  Aortic valve replacement  02/2005   .  Insert / replace / remove pacemaker    .  Coronary artery bypass graft      x5 -- last one in 07   .  Lithotripsy     No family history on file.  History    Social History   .  Marital Status:  Married     Spouse Name:  N/A     Number of Children:  N/A   .  Years of Education:  N/A    Occupational History   .  Not on file.    Social History Main Topics   .  Smoking status:  Former Smoker     Quit date:  02/08/1954   .  Smokeless tobacco:  Not on file   .  Alcohol Use:  No   .  Drug Use:  No   .  Sexually Active:  Not on file    Family History  Concern   .  HTN, CAD Father and mother    Social History Narrative   .  Lives with wife. No smoking, no ETOH use    Review of Systems: A comprehensive review of systems was negative except as noted in the HPI.   Physical Exam:  General: Alert, awake, oriented x3, in no acute distress. Appears euvolemic.  HEENT: [place]/AT PEERL, EOMI  Neck: Trachea midline, no masses, no thyromegal,y no JVD, no carotid bruit  OROPHARYNX: Moist, No exudate/  erythema/lesions.  Heart: Regular rate and rhythm, II-III/VI murmur at 2nd intercostal space, no rubs or gallops, PMI non-displaced, no heaves or thrills on palpation.  Lungs: Clear to auscultation, no wheezing or rhonchi noted.  Abdomen: Soft, nontender, nondistended, positive bowel sounds, no masses no hepatosplenomegaly noted..  Neuro: No focal neurological deficits noted . Strength functional in bilateral upper and lower extremities.  Musculoskeletal: No warm swelling or erythema around joints, no spinal tenderness noted.  Psychiatric: Patient alert and oriented x3, good insight and cognition, good recent to remote recall.  Skin: Pt has good skin turgor.  Lab results:   [facility]  11/26/11 0553   NA  124*   K  4.5   CL  89*   CO2  23   GLUCOSE  127*   BUN  16   CREATININE  1.05   CALCIUM  9.3   MG  --   PHOS  --    No results found for this basename: AST:2,ALT:2,ALKPHOS:2,BILITOT:2,PROT:2,ALBUMIN:2 in the last 72 hours  No results found for this basename: LIPASE:2,AMYLASE:2 in the last 72 hours   Basename  11/26/11 0553   WBC  10.0   NEUTROABS  --   HGB  12.0*   HCT  34.3*   MCV  82.7   PLT  204    Imaging results:  Dg Chest 2 View  11/26/2011 *RADIOLOGY REPORT* Clinical Data: Shortness of breath. CHEST - 2 VIEW Comparison: 02/01/2011 Findings: Stable appearance of postoperative changes in the mediastinum and cardiac pacemaker. Mild cardiac enlargement with normal pulmonary vascularity. New blunting of costophrenic angles suggest small effusions. Interstitial changes could represent fibrosis or interstitial edema. Pleural calcifications on the right. Calcification of the aorta. No pneumothorax. IMPRESSION: Cardiac enlargement without significant vascular congestion. Small pleural effusions. Interstitial edema versus fibrosis. Original Report Authenticated By: Marlon Pel, M.D.   Other results:  EKG: normal EKG, normal sinus rhythm with paced rhythm.   Patient Active  Hospital Problem List:  Acute on chronic systolic congestive heart  failure (11/26/2011) Assessment: Pt presents with increased DOE. He does have an elevated BNP and clinical findings of mixed consistancy with an acute exacerbation of CHF, however the historical course of his presenting symptoms leaves room for pulmonary causes of his DOE. In addition, the patient appears euvolemic. Plan: In light of the increased BNP, I will start IV lasix. However if no significant improvement in 24 hours, will pursue evaluating for other causes including PE.   Hyponatremia (11/26/2011) Assessment: My assessment is that this is resultant from diuretics and possibly hemodilution. Unfortunately, I have no urine evaluation prior to administration of Lasix, thus urine studies at this time would be less helpful in evluaating the patient. The pt is mentally alert and shows no concerning clinical symptoms related to hyponatremia. Plan: will continue to monitor.  PACEMAKER, PERMANENT (01/19/2009) Assessment: EKG indicates good capture.  BPH (benign prostatic hyperplasia) () Assessment: Continue avodart  Dyspnea on exertion (11/26/2011) Assessment: See above. DVT prophylaxis: Lovenox   Total time for initial evaluation approximately 45 minutes.  MATTHEWS,MICHELLE A.  11/26/2011, 8:46 AM

## 2012-01-20 ENCOUNTER — Ambulatory Visit (INDEPENDENT_AMBULATORY_CARE_PROVIDER_SITE_OTHER): Payer: Medicare Other | Admitting: Cardiology

## 2012-01-20 ENCOUNTER — Encounter: Payer: Self-pay | Admitting: Cardiology

## 2012-01-20 VITALS — BP 112/58 | HR 61 | Ht 67.0 in | Wt 142.8 lb

## 2012-01-20 DIAGNOSIS — I2589 Other forms of chronic ischemic heart disease: Secondary | ICD-10-CM

## 2012-01-20 DIAGNOSIS — I10 Essential (primary) hypertension: Secondary | ICD-10-CM

## 2012-01-20 DIAGNOSIS — I255 Ischemic cardiomyopathy: Secondary | ICD-10-CM

## 2012-01-20 DIAGNOSIS — I5022 Chronic systolic (congestive) heart failure: Secondary | ICD-10-CM

## 2012-01-20 DIAGNOSIS — I779 Disorder of arteries and arterioles, unspecified: Secondary | ICD-10-CM

## 2012-01-20 NOTE — Assessment & Plan Note (Signed)
I'll be sure that he has his followup carotid Doppler scheduled.

## 2012-01-20 NOTE — Patient Instructions (Addendum)
Your physician recommends that you schedule a follow-up appointment in: January along with a carotid doppler the same day  Your physician has requested that you have a carotid duplex the same day as your appt with Dr Myrtis Ser in January. This test is an ultrasound of the carotid arteries in your neck. It looks at blood flow through these arteries that supply the brain with blood. Allow one hour for this exam. There are no restrictions or special instructions.

## 2012-01-20 NOTE — Progress Notes (Signed)
HPI   Patient is seen in followup CHF. Since seeing me last the patient was hospitalized for a few days with CHF. He was diuresis. He now is very careful with his salt and fluid. He weighs himself daily. He's doing an excellent job.  Allergies  Allergen Reactions  . Niaspan (Niacin Er) Other (See Comments)    Unknown reaction  . Sulfonamide Derivatives Other (See Comments)    Unknown reaction    Current Outpatient Prescriptions  Medication Sig Dispense Refill  . aspirin EC 81 MG tablet Take 81 mg by mouth daily.      . AVODART 0.5 MG capsule Take 1 tablet by mouth Daily.      . carvedilol (COREG) 12.5 MG tablet Take 1 tablet (12.5 mg total) by mouth 2 (two) times daily with a meal.  60 tablet  6  . docusate sodium (COLACE) 100 MG capsule Take 100 mg by mouth 2 (two) times daily.        [name] doxazosin (CARDURA) 4 MG tablet Take 4 mg by mouth daily.        . furosemide (LASIX) 40 MG tablet Take 1 tablet (40 mg total) by mouth daily.  90 tablet  3  . Multiple Vitamin (MULTIVITAMIN) tablet Take 1 tablet by mouth daily.        . nitroGLYCERIN (NITROSTAT) 0.4 MG SL tablet Place 1 tablet (0.4 mg total) under the tongue every 5 (five) minutes as needed for chest pain.  30 tablet  12  . omeprazole (PRILOSEC) 20 MG capsule Take 1 capsule (20 mg total) by mouth daily.  90 capsule  3  . potassium chloride SA (K-DUR,KLOR-CON) 20 MEQ tablet Take 1 tablet (20 mEq total) by mouth daily.  90 tablet  3  . ramipril (ALTACE) 10 MG capsule Take 1 capsule (10 mg total) by mouth daily.  90 capsule  2  . simvastatin (ZOCOR) 20 MG tablet Take 1 tablet (20 mg total) by mouth at bedtime.  90 tablet  3  . Wheat Dextrin (BENEFIBER) POWD Take by mouth daily.        . Zinc 50 MG TABS Take 50 mg by mouth daily.          History   Social History  . Marital Status: Married    Spouse Name: N/A    Number of Children: N/A  . Years of Education: N/A   Occupational History  . Retired Radiographer, therapeutic    Social History Main  Topics  . Smoking status: Former Smoker    Quit date: 02/08/1954  . Smokeless tobacco: Not on file  . Alcohol Use: No  . Drug Use: No  . Sexually Active: Not on file   Other Topics Concern  . Not on file   Social History Narrative   No CAD known in any siblings.    History reviewed. No pertinent family history.  Past Medical History  Diagnosis Date  . Hyperlipidemia   . LBBB (left bundle branch block)   . Depression   . COPD (chronic obstructive pulmonary disease)   . Calcium oxalate renal stones   . Dizziness   . GERD (gastroesophageal reflux disease)   . Diverticulosis   . IBS (irritable bowel syndrome)   . Leg cramps     not vascular in origin   . Liver cyst   . Wide-complex tachycardia     post op - on a beta blocker   . Sinus congestion   . Bleeding nose  cauteriszed dr Lazarus Salines  / cauterization repeated 10/11  . Coronary artery disease     Nuclear June 03, 2010, scar anterior wall apex and inferior wall, no definite ischemia,    not gated  . Intolerance of drug     Niaspan  . S/P aortic valve replacement with bioprosthetic valve     echo.[name] April, 2011... good valve function... mild AI  . S/P CABG (coronary artery bypass graft) January, 2007  . LV dysfunction     EF 45% / EF 25-30%, echo, April, 2011 / EF 25-30%, echo, April, 2012, akinesis of the inferior and posterior walls  . Ischemic cardiomyopathy     EF followed carefully  . Carotid artery disease     Doppler, 2011, bilateral 40-59%    /   Doppler, March, 2013, no change, bilateral 40-59%  . BPH (benign prostatic hyperplasia)     2013    Past Surgical History  Procedure Date  . Cholecystectomy   . Aortic valve replacement 02/2005  . Insert / replace / remove pacemaker   . Coronary artery bypass graft     x5  -- last one in 07  . Lithotripsy     Patient Active Problem List  Diagnosis  . ESSENTIAL HYPERTENSION, BENIGN  . UNSPECIFIED HEMORRHAGE  . COPD  . GERD  . DIVERTICULOSIS OF COLON    . AORTIC VALVE REPLACEMENT, HX OF  . PACEMAKER, PERMANENT  . CORONARY ARTERY BYPASS GRAFT, HX OF  . Hyperlipidemia  . LBBB (left bundle branch block)  . Ischemic cardiomyopathy  . Coronary artery disease  . Chronic systolic heart failure  . Carotid artery disease  . BPH (benign prostatic hyperplasia)  . Acute on chronic systolic congestive heart failure  . Hyponatremia  . Dyspnea on exertion    ROS   Patient denies fever, chills, headache, sweats, rash, change in vision, change in hearing, chest pain, cough, nausea vomiting, urinary symptoms. All other systems are reviewed and are negative.  PHYSICAL EXAM  Patient looks good. He is oriented to person time and place. Affect is normal. Lungs reveal mildly decreased breath sounds. There are no rales. There is no respiratory distress. Cardiac exam reveals S1 and S2. There is a soft systolic murmur. The abdomen is soft. Is no peripheral edema.  Filed Vitals:   01/20/12 0906  BP: 112/58  Pulse: 61  Height: 5\' 7"  (1.702 m)  Weight: 142 lb 12.8 oz (64.774 kg)  SpO2: 99%     ASSESSMENT & PLAN

## 2012-01-20 NOTE — Assessment & Plan Note (Signed)
Blood pressure is well controlled. No change in therapy. 

## 2012-01-20 NOTE — Assessment & Plan Note (Signed)
He is stable at this time. No further workup. I discussed the fact that he does not need an echo at this time. We will considered in the future.

## 2012-01-20 NOTE — Assessment & Plan Note (Signed)
Patient is on all of the appropriate medications. I cannot titrate them any further.

## 2012-02-17 ENCOUNTER — Encounter (INDEPENDENT_AMBULATORY_CARE_PROVIDER_SITE_OTHER): Payer: Medicare Other

## 2012-02-17 ENCOUNTER — Ambulatory Visit (INDEPENDENT_AMBULATORY_CARE_PROVIDER_SITE_OTHER): Payer: Medicare Other | Admitting: Cardiology

## 2012-02-17 ENCOUNTER — Encounter: Payer: Self-pay | Admitting: Cardiology

## 2012-02-17 VITALS — BP 98/44 | HR 66 | Ht 67.0 in | Wt 143.8 lb

## 2012-02-17 DIAGNOSIS — Z953 Presence of xenogenic heart valve: Secondary | ICD-10-CM

## 2012-02-17 DIAGNOSIS — I2510 Atherosclerotic heart disease of native coronary artery without angina pectoris: Secondary | ICD-10-CM

## 2012-02-17 DIAGNOSIS — I5022 Chronic systolic (congestive) heart failure: Secondary | ICD-10-CM | POA: Insufficient documentation

## 2012-02-17 DIAGNOSIS — I509 Heart failure, unspecified: Secondary | ICD-10-CM

## 2012-02-17 DIAGNOSIS — I255 Ischemic cardiomyopathy: Secondary | ICD-10-CM

## 2012-02-17 DIAGNOSIS — I779 Disorder of arteries and arterioles, unspecified: Secondary | ICD-10-CM

## 2012-02-17 DIAGNOSIS — Z952 Presence of prosthetic heart valve: Secondary | ICD-10-CM

## 2012-02-17 DIAGNOSIS — I2589 Other forms of chronic ischemic heart disease: Secondary | ICD-10-CM

## 2012-02-17 DIAGNOSIS — R05 Cough: Secondary | ICD-10-CM

## 2012-02-17 DIAGNOSIS — I10 Essential (primary) hypertension: Secondary | ICD-10-CM

## 2012-02-17 DIAGNOSIS — I6529 Occlusion and stenosis of unspecified carotid artery: Secondary | ICD-10-CM

## 2012-02-17 MED ORDER — FUROSEMIDE 40 MG PO TABS: 40.0000 mg | ORAL_TABLET | Freq: Every day | ORAL | Status: DC

## 2012-02-17 NOTE — Assessment & Plan Note (Signed)
Over time his valve has worked well. No change in therapy at this time.

## 2012-02-17 NOTE — Patient Instructions (Addendum)
Your physician wants you to follow-up in: 3-4 months.   You will receive a reminder letter in the mail two months in advance. If you don't receive a letter, please call our office to schedule the follow-up appointment.  Your physician recommends that you continue on your current medications as directed. Please refer to the Current Medication list given to you today.

## 2012-02-17 NOTE — Progress Notes (Signed)
HPI  Patient is seen in followup CHF. I saw him last January 20, 2012. At that time he was stable after a hospitalization for CHF. He is stable. His weight is stable. He's not having any significant shortness of breath. He has had a viral upper respiratory infection. He's had a cough with this. He thinks he may have a mild chronic cough. He is on an ACE inhibitor.  Allergies  Allergen Reactions  . Niaspan (Niacin Er) Other (See Comments)    Unknown reaction  . Sulfonamide Derivatives Other (See Comments)    Unknown reaction    Current Outpatient Prescriptions  Medication Sig Dispense Refill  . aspirin EC 81 MG tablet Take 81 mg by mouth daily.      . AVODART 0.5 MG capsule Take 1 tablet by mouth Daily.      . carvedilol (COREG) 12.5 MG tablet Take 1 tablet (12.5 mg total) by mouth 2 (two) times daily with a meal.  60 tablet  6  . docusate sodium (COLACE) 100 MG capsule Take 100 mg by mouth 2 (two) times daily.        [name] doxazosin (CARDURA) 4 MG tablet Take 4 mg by mouth daily.        . furosemide (LASIX) 40 MG tablet Take 1 tablet (40 mg total) by mouth daily.  90 tablet  3  . Multiple Vitamin (MULTIVITAMIN) tablet Take 1 tablet by mouth daily.        . nitroGLYCERIN (NITROSTAT) 0.4 MG SL tablet Place 1 tablet (0.4 mg total) under the tongue every 5 (five) minutes as needed for chest pain.  30 tablet  12  . omeprazole (PRILOSEC) 20 MG capsule Take 1 capsule (20 mg total) by mouth daily.  90 capsule  3  . potassium chloride SA (K-DUR,KLOR-CON) 20 MEQ tablet Take 1 tablet (20 mEq total) by mouth daily.  90 tablet  3  . ramipril (ALTACE) 10 MG capsule Take 1 capsule (10 mg total) by mouth daily.  90 capsule  2  . simvastatin (ZOCOR) 20 MG tablet Take 1 tablet (20 mg total) by mouth at bedtime.  90 tablet  3  . Wheat Dextrin (BENEFIBER) POWD Take by mouth daily.        . Zinc 50 MG TABS Take 50 mg by mouth daily.          History   Social History  . Marital Status: Married    Spouse  Name: N/A    Number of Children: N/A  . Years of Education: N/A   Occupational History  . Retired Radiographer, therapeutic    Social History Main Topics  . Smoking status: Former Smoker    Quit date: 02/08/1954  . Smokeless tobacco: Not on file  . Alcohol Use: No  . Drug Use: No  . Sexually Active: Not on file   Other Topics Concern  . Not on file   Social History Narrative   No CAD known in any siblings.    No family history on file.  Past Medical History  Diagnosis Date  . Hyperlipidemia   . LBBB (left bundle branch block)   . Depression   . COPD (chronic obstructive pulmonary disease)   . Calcium oxalate renal stones   . Dizziness   . GERD (gastroesophageal reflux disease)   . Diverticulosis   . IBS (irritable bowel syndrome)   . Leg cramps     not vascular in origin   . Liver cyst   .  Wide-complex tachycardia     post op - on a beta blocker   . Sinus congestion   . Bleeding nose     cauteriszed dr Lazarus Salines  / cauterization repeated 10/11  . Coronary artery disease     Nuclear June 03, 2010, scar anterior wall apex and inferior wall, no definite ischemia,    not gated  . Intolerance of drug     Niaspan  . S/P aortic valve replacement with bioprosthetic valve     echo.[name] April, 2011... good valve function... mild AI  . S/P CABG (coronary artery bypass graft) January, 2007  . LV dysfunction     EF 45% / EF 25-30%, echo, April, 2011 / EF 25-30%, echo, April, 2012, akinesis of the inferior and posterior walls  . Ischemic cardiomyopathy     EF followed carefully  . Carotid artery disease     Doppler, 2011, bilateral 40-59%    /   Doppler, March, 2013, no change, bilateral 40-59%  . BPH (benign prostatic hyperplasia)     2013    Past Surgical History  Procedure Date  . Cholecystectomy   . Aortic valve replacement 02/2005  . Insert / replace / remove pacemaker   . Coronary artery bypass graft     x5  -- last one in 07  . Lithotripsy     Patient Active Problem List    Diagnosis  . ESSENTIAL HYPERTENSION, BENIGN  . UNSPECIFIED HEMORRHAGE  . COPD  . GERD  . DIVERTICULOSIS OF COLON  . AORTIC VALVE REPLACEMENT, HX OF  . PACEMAKER, PERMANENT  . CORONARY ARTERY BYPASS GRAFT, HX OF  . Hyperlipidemia  . LBBB (left bundle branch block)  . Ischemic cardiomyopathy  . Coronary artery disease  . Chronic systolic heart failure  . Carotid artery disease  . BPH (benign prostatic hyperplasia)  . Acute on chronic systolic congestive heart failure  . Hyponatremia  . Dyspnea on exertion    ROS   Patient denies fever, chills, headache, sweats, rash, change in vision, change in hearing, chest pain, nausea vomiting, urinary symptoms. All other systems are reviewed and are negative other than the history of present illness.  PHYSICAL EXAM  Patient is oriented to person time and place. Affect is normal. There is no jugulovenous distention. Lungs are clear. Respiratory effort is nonlabored. Cardiac exam reveals S1 and S2. There no clicks or significant murmurs. The abdomen is soft. There is no peripheral edema.  Filed Vitals:   02/17/12 1143  BP: 98/44  Pulse: 66  Height: 5\' 7"  (1.702 m)  Weight: 143 lb 12.8 oz (65.227 kg)  SpO2: 96%     ASSESSMENT & PLAN

## 2012-02-17 NOTE — Assessment & Plan Note (Signed)
The patient has a mild cough. This may be residual from his upper respiratory infection. He thinks it may predate the infection. It could be related to the ACE inhibitor. We discussed this. We decided not to make a change today but to follow this over time.

## 2012-02-17 NOTE — Assessment & Plan Note (Signed)
Coronary disease is stable. No change in therapy. 

## 2012-02-17 NOTE — Assessment & Plan Note (Signed)
Patient is on appropriate medications. No change in therapy. 

## 2012-02-17 NOTE — Assessment & Plan Note (Signed)
Carotid disease is stable. There was no change today. We will not schedule a followup study at this time.

## 2012-02-17 NOTE — Assessment & Plan Note (Signed)
Blood pressure is actually on the low side. I discussed this with him. I explained that it was good to have his pressure in this range with his cardiomyopathy. He's not having any symptoms. No change in therapy.

## 2012-02-17 NOTE — Assessment & Plan Note (Signed)
CHF is controlled at this time. No change in therapy.

## 2012-02-21 ENCOUNTER — Encounter: Payer: Self-pay | Admitting: Internal Medicine

## 2012-02-21 DIAGNOSIS — I442 Atrioventricular block, complete: Secondary | ICD-10-CM

## 2012-04-07 ENCOUNTER — Ambulatory Visit (INDEPENDENT_AMBULATORY_CARE_PROVIDER_SITE_OTHER): Payer: Medicare Other | Admitting: Cardiology

## 2012-04-07 DIAGNOSIS — I442 Atrioventricular block, complete: Secondary | ICD-10-CM

## 2012-04-07 DIAGNOSIS — Z950 Presence of cardiac pacemaker: Secondary | ICD-10-CM

## 2012-04-07 LAB — PACEMAKER DEVICE OBSERVATION
AL IMPEDENCE PM: 367 Ohm
ATRIAL PACING PM: 77
BATTERY VOLTAGE: 2.78 V
RV LEAD IMPEDENCE PM: 294 Ohm
VENTRICULAR PACING PM: 95

## 2012-04-07 NOTE — Patient Instructions (Addendum)
Your physician wants you to follow-up in: July with Dr Court Joy will receive a reminder letter in the mail two months in advance. If you don't receive a letter, please call our office to schedule the follow-up appointment.

## 2012-04-07 NOTE — Progress Notes (Signed)
PPM check/device clinic visit. See PaceArt report. 

## 2012-04-24 ENCOUNTER — Encounter: Payer: Self-pay | Admitting: Internal Medicine

## 2012-05-17 ENCOUNTER — Telehealth: Payer: Self-pay | Admitting: Cardiology

## 2012-05-17 MED ORDER — CARVEDILOL 12.5 MG PO TABS: 12.5000 mg | ORAL_TABLET | Freq: Two times a day (BID) | ORAL | Status: DC

## 2012-05-17 NOTE — Telephone Encounter (Signed)
N/A.  LMTC. 

## 2012-05-17 NOTE — Telephone Encounter (Signed)
Pt was notified that rx was sent.

## 2012-05-17 NOTE — Telephone Encounter (Signed)
Refill of Carvedilol sent to [facility] in [place].

## 2012-05-17 NOTE — Telephone Encounter (Signed)
New Prob     Requesting a new prescription of carvedilol 12.5 mg to [facility] in [place] (2 month supply). Would like to speak to nurse.

## 2012-07-05 ENCOUNTER — Encounter: Payer: Self-pay | Admitting: Internal Medicine

## 2012-07-05 DIAGNOSIS — I442 Atrioventricular block, complete: Secondary | ICD-10-CM

## 2012-07-10 ENCOUNTER — Other Ambulatory Visit: Payer: Self-pay | Admitting: Emergency Medicine

## 2012-07-10 MED ORDER — RAMIPRIL 10 MG PO CAPS: 10.0000 mg | ORAL_CAPSULE | Freq: Every day | ORAL | Status: DC

## 2012-08-14 NOTE — Progress Notes (Signed)
ELECTROPHYSIOLOGY OFFICE NOTE  Patient ID: Bill Cunningham MRN: [id number], DOB/AGE: 10-28-1921   Date of Visit: 08/15/2012  Primary Physician: Beverley Fiedler, MD Primary Cardiologist / EP: Myrtis Ser. MD / Ladona Ridgel, MD Reason for Visit: EP/device follow-up  History of Present Illness  Bill Cunningham is a 79 y.o. male with CHB s/p PPM implant, ischemic CM, chronic systolic HF, AS s/p bioprosthetic AVR and CAD who presents today for routine electrophysiology followup.   Since last being seen in our clinic, he reports he is doing well. He reports exertional CP "funny feeling" while performing yard work 2-3 months ago. This lasted for 2-3 days, occurring while "working pretty hard in the garden." He states it was mild and relieved quickly with rest. He did not use SL NTG stating "It wasn't even that bad and it was very brief." His symptoms have improved since that time and he is '"taking it easy." He denies shortness of breath. He denies palpitations, dizziness, near syncope or syncope. He denies LE swelling, orthopnea, PND or recent weight gain. He is compliant and tolerating medications without difficulty.  Past Medical History Past Medical History  Diagnosis Date  . Hyperlipidemia   . LBBB (left bundle branch block)   . Depression   . COPD (chronic obstructive pulmonary disease)   . Calcium oxalate renal stones   . Dizziness   . GERD (gastroesophageal reflux disease)   . Diverticulosis   . IBS (irritable bowel syndrome)   . Leg cramps     not vascular in origin   . Liver cyst   . Wide-complex tachycardia     post op - on a beta blocker   . Sinus congestion   . Bleeding nose     cauteriszed dr Lazarus Salines  / cauterization repeated 10/11  . Coronary artery disease     Nuclear June 03, 2010, scar anterior wall apex and inferior wall, no definite ischemia,    not gated  . Intolerance of drug     Niaspan  . S/P aortic valve replacement with bioprosthetic valve     echo.[name] April, 2011... good  valve function... mild AI  . S/P CABG (coronary artery bypass graft) January, 2007  . LV dysfunction     EF 45% / EF 25-30%, echo, April, 2011 / EF 25-30%, echo, April, 2012, akinesis of the inferior and posterior walls  . Ischemic cardiomyopathy     EF followed carefully  . Carotid artery disease     Doppler, 2011, bilateral 40-59%    /   Doppler, March, 2013, no change, bilateral 40-59%  . BPH (benign prostatic hyperplasia)     2013  . Cough     January, 2014    Past Surgical History Past Surgical History  Procedure Laterality Date  . Cholecystectomy    . Aortic valve replacement  02/2005  . Insert / replace / remove pacemaker    . Coronary artery bypass graft      x5  -- last one in 07  . Lithotripsy      Allergies/Intolerances Allergies  Allergen Reactions  . Niaspan (Niacin Er) Other (See Comments)    Unknown reaction  . Sulfonamide Derivatives Other (See Comments)    Unknown reaction   Current Home Medications Current Outpatient Prescriptions  Medication Sig Dispense Refill  . aspirin EC 81 MG tablet Take 81 mg by mouth daily.      . AVODART 0.5 MG capsule Take 1 tablet by mouth Daily.      [name]  carvedilol (COREG) 12.5 MG tablet Take 1 tablet (12.5 mg total) by mouth 2 (two) times daily with a meal.  60 tablet  9  . docusate sodium (COLACE) 100 MG capsule Take 100 mg by mouth daily as needed.       . doxazosin (CARDURA) 4 MG tablet Take 4 mg by mouth daily.        . furosemide (LASIX) 40 MG tablet Take 1 tablet (40 mg total) by mouth daily.  90 tablet  3  . Multiple Vitamin (MULTIVITAMIN) tablet Take 1 tablet by mouth daily.        . nitroGLYCERIN (NITROSTAT) 0.4 MG SL tablet Place 1 tablet (0.4 mg total) under the tongue every 5 (five) minutes as needed for chest pain.  30 tablet  12  . omeprazole (PRILOSEC) 20 MG capsule Take 1 capsule (20 mg total) by mouth daily.  90 capsule  3  . potassium chloride SA (K-DUR,KLOR-CON) 20 MEQ tablet Take 1 tablet (20 mEq total) by  mouth daily.  90 tablet  3  . ramipril (ALTACE) 10 MG capsule Take 1 capsule (10 mg total) by mouth daily.  90 capsule  3  . simvastatin (ZOCOR) 20 MG tablet Take 1 tablet (20 mg total) by mouth at bedtime.  90 tablet  3  . Wheat Dextrin (BENEFIBER) POWD Take by mouth daily.        . Zinc 50 MG TABS Take 50 mg by mouth daily.         No current facility-administered medications for this visit.   Social History History   Social History  . Marital Status: Married    Spouse Name: N/A    Number of Children: N/A  . Years of Education: N/A   Occupational History  . Retired Radiographer, therapeutic    Social History Main Topics  . Smoking status: Former Smoker    Quit date: 02/08/1954  . Smokeless tobacco: Not on file  . Alcohol Use: No  . Drug Use: No  . Sexually Active: Not on file   Other Topics Concern  . Not on file   Social History Narrative   No CAD known in any siblings.          Review of Systems General: No chills, fever, night sweats or weight changes Cardiovascular: No chest pain, dyspnea on exertion, edema, orthopnea, palpitations, paroxysmal nocturnal dyspnea Dermatological: No rash, lesions or masses Respiratory: No cough, dyspnea Urologic: No hematuria, dysuria Abdominal: No nausea, vomiting, diarrhea, bright red blood per rectum, melena, or hematemesis Neurologic: No visual changes, weakness, changes in mental status All other systems reviewed and are otherwise negative except as noted above.  Physical Exam Vitals: Blood pressure 119/62, pulse 60, height 5\' 7"  (1.702 m), weight 146 lb 12.8 oz (66.588 kg).  General: Well developed, well appearing 79 y.o. male in no acute distress. HEENT: Normocephalic, atraumatic. EOMs intact. Sclera nonicteric. Oropharynx clear.  Neck: Supple without bruits. No JVD. Lungs: Respirations regular and unlabored, CTA bilaterally. No wheezes, rales or rhonchi. Heart: RRR. S1, S2 present. No murmurs, rub, S3 or S4. Abdomen: Soft, non-distended.     Extremities: No clubbing, cyanosis or edema. PT/Radials 2+ and equal bilaterally. Psych: Normal affect. Neuro: Alert and oriented X 3. Moves all extremities spontaneously.   Diagnostics Device interrogation today - Normal device function. Thresholds, sensing, impedances consistent with previous measurements. Device programmed to maximize longevity. No mode switch episodes. Device programmed at appropriate safety margins. Histogram distribution appropriate for patient activity level. Device programmed  to optimize intrinsic conduction. Estimated longevity 4 - 4.75 years.  Assessment and Plan 1. CHB s/p PPM implant Normal device function No programming changes made Continue remote PPM follow-up every 3 months Return for follow-up with Dr. Ladona Ridgel in one year 2. Ischemic CM with chronic systolic HF Stable; euvolemic by exam today Continue medical therapy 3. CAD and valvular heart disease s/p bioprosthetic AVR Bill Cunningham reports exertional CP, lasting 2-3 days, about 2-3 months ago. He states this occurred with moderate exertion, while "working pretty hard in the garden and was mild, quickly relieved with rest. He did not feel that he needed to use SL NTG. He is not having CP now. I will notify Dr. Myrtis Ser and arrange follow-up. Bill Cunningham was counseled on the appropriate use of SL NTG and when to seek emergent medical care should his symptoms return.     Signed, Rick Duff, PA-C 08/15/2012, 12:29 PM

## 2012-08-15 ENCOUNTER — Encounter: Payer: Self-pay | Admitting: Cardiology

## 2012-08-15 ENCOUNTER — Ambulatory Visit (INDEPENDENT_AMBULATORY_CARE_PROVIDER_SITE_OTHER): Payer: Medicare Other | Admitting: Cardiology

## 2012-08-15 VITALS — BP 119/62 | HR 60 | Ht 67.0 in | Wt 146.8 lb

## 2012-08-15 DIAGNOSIS — Z952 Presence of prosthetic heart valve: Secondary | ICD-10-CM

## 2012-08-15 DIAGNOSIS — I5022 Chronic systolic (congestive) heart failure: Secondary | ICD-10-CM

## 2012-08-15 DIAGNOSIS — Z954 Presence of other heart-valve replacement: Secondary | ICD-10-CM

## 2012-08-15 DIAGNOSIS — E785 Hyperlipidemia, unspecified: Secondary | ICD-10-CM

## 2012-08-15 DIAGNOSIS — I255 Ischemic cardiomyopathy: Secondary | ICD-10-CM

## 2012-08-15 DIAGNOSIS — I442 Atrioventricular block, complete: Secondary | ICD-10-CM

## 2012-08-15 DIAGNOSIS — I2589 Other forms of chronic ischemic heart disease: Secondary | ICD-10-CM

## 2012-08-15 DIAGNOSIS — Z950 Presence of cardiac pacemaker: Secondary | ICD-10-CM

## 2012-08-15 DIAGNOSIS — I2510 Atherosclerotic heart disease of native coronary artery without angina pectoris: Secondary | ICD-10-CM

## 2012-08-15 DIAGNOSIS — I359 Nonrheumatic aortic valve disorder, unspecified: Secondary | ICD-10-CM

## 2012-08-15 LAB — PACEMAKER DEVICE OBSERVATION
ATRIAL PACING PM: 76
DEVICE MODEL PM: 1609308
RV LEAD THRESHOLD: 0.875 V
VENTRICULAR PACING PM: 98

## 2012-08-15 MED ORDER — POTASSIUM CHLORIDE CRYS ER 20 MEQ PO TBCR: 20.0000 meq | EXTENDED_RELEASE_TABLET | Freq: Every day | ORAL | Status: DC

## 2012-08-15 MED ORDER — SIMVASTATIN 20 MG PO TABS: 20.0000 mg | ORAL_TABLET | Freq: Every day | ORAL | Status: DC

## 2012-08-15 NOTE — Patient Instructions (Addendum)
Keep appointment with Dr. Myrtis Ser  Your physician wants you to follow-up in: 1 YR OV with Dr. Court Joy will receive a reminder letter in the mail two months in advance. If you don't receive a letter, please call our office to schedule the follow-up appointment.

## 2012-08-17 ENCOUNTER — Encounter: Payer: Medicare Other | Admitting: Cardiology

## 2012-08-18 ENCOUNTER — Telehealth: Payer: Self-pay | Admitting: Gastroenterology

## 2012-08-18 MED ORDER — OMEPRAZOLE 20 MG PO CPDR: 20.0000 mg | DELAYED_RELEASE_CAPSULE | Freq: Every day | ORAL | Status: DC

## 2012-08-18 NOTE — Telephone Encounter (Signed)
Called patient because patient needs an office visit for further refills, was trying to make an appointment.  Cannot leave message, machine came on saying please insert your remote access code.

## 2012-08-19 ENCOUNTER — Encounter: Payer: Self-pay | Admitting: Cardiology

## 2012-08-19 NOTE — Progress Notes (Signed)
Pt has had some rare chest pain per the EP note  We will call to follow up.

## 2012-10-02 ENCOUNTER — Ambulatory Visit (INDEPENDENT_AMBULATORY_CARE_PROVIDER_SITE_OTHER): Payer: Medicare Other | Admitting: Cardiology

## 2012-10-02 ENCOUNTER — Encounter: Payer: Self-pay | Admitting: Cardiology

## 2012-10-02 VITALS — BP 118/58 | HR 65 | Ht 67.0 in | Wt 145.0 lb

## 2012-10-02 DIAGNOSIS — I255 Ischemic cardiomyopathy: Secondary | ICD-10-CM

## 2012-10-02 DIAGNOSIS — Z954 Presence of other heart-valve replacement: Secondary | ICD-10-CM

## 2012-10-02 DIAGNOSIS — R9430 Abnormal result of cardiovascular function study, unspecified: Secondary | ICD-10-CM | POA: Insufficient documentation

## 2012-10-02 DIAGNOSIS — R0989 Other specified symptoms and signs involving the circulatory and respiratory systems: Secondary | ICD-10-CM

## 2012-10-02 DIAGNOSIS — I5022 Chronic systolic (congestive) heart failure: Secondary | ICD-10-CM

## 2012-10-02 DIAGNOSIS — I2589 Other forms of chronic ischemic heart disease: Secondary | ICD-10-CM

## 2012-10-02 DIAGNOSIS — I779 Disorder of arteries and arterioles, unspecified: Secondary | ICD-10-CM

## 2012-10-02 NOTE — Assessment & Plan Note (Signed)
His CHF is stable. No change in therapy.

## 2012-10-02 NOTE — Assessment & Plan Note (Signed)
Patient's meds have been titrated appropriately. No change in therapy.

## 2012-10-02 NOTE — Assessment & Plan Note (Signed)
Carotid Doppler, January, 2014, stable. The plan is to do followup carotid Doppler based on the timing of my discretion

## 2012-10-02 NOTE — Progress Notes (Signed)
HPI  Patient is seen today to followup coronary disease and left ventricular dysfunction. When he was having his pacemaker checked earlier this year he mentioned some discomfort around the pacer site. This was mild. He has been stable since then. His pacer function has been good when checked. He's not having any significant chest pain. He is actually quite active working in his garden. He may feel rare palpitation. This is very limited.  Allergies  Allergen Reactions  . Niaspan [Niacin Er] Other (See Comments)    Unknown reaction  . Sulfonamide Derivatives Other (See Comments)    Unknown reaction    Current Outpatient Prescriptions  Medication Sig Dispense Refill  . aspirin EC 81 MG tablet Take 81 mg by mouth daily.      . AVODART 0.5 MG capsule Take 1 tablet by mouth Daily.      . carvedilol (COREG) 12.5 MG tablet Take 1 tablet (12.5 mg total) by mouth 2 (two) times daily with a meal.  60 tablet  9  . docusate sodium (COLACE) 100 MG capsule Take 100 mg by mouth daily as needed.       . doxazosin (CARDURA) 4 MG tablet Take 4 mg by mouth daily.        . furosemide (LASIX) 40 MG tablet Take 1 tablet (40 mg total) by mouth daily.  90 tablet  3  . Multiple Vitamin (MULTIVITAMIN) tablet Take 1 tablet by mouth daily.        . nitroGLYCERIN (NITROSTAT) 0.4 MG SL tablet Place 1 tablet (0.4 mg total) under the tongue every 5 (five) minutes as needed for chest pain.  30 tablet  12  . omeprazole (PRILOSEC) 20 MG capsule Take 1 capsule (20 mg total) by mouth daily.  90 capsule  0  . potassium chloride SA (K-DUR,KLOR-CON) 20 MEQ tablet Take 1 tablet (20 mEq total) by mouth daily.  90 tablet  3  . ramipril (ALTACE) 10 MG capsule Take 1 capsule (10 mg total) by mouth daily.  90 capsule  3  . simvastatin (ZOCOR) 20 MG tablet Take 1 tablet (20 mg total) by mouth at bedtime.  90 tablet  3  . Wheat Dextrin (BENEFIBER) POWD Take by mouth daily.        . Zinc 50 MG TABS Take 50 mg by mouth daily.          No current facility-administered medications for this visit.    History   Social History  . Marital Status: Married    Spouse Name: N/A    Number of Children: N/A  . Years of Education: N/A   Occupational History  . Retired Radiographer, therapeutic    Social History Main Topics  . Smoking status: Former Smoker    Quit date: 02/08/1954  . Smokeless tobacco: Not on file  . Alcohol Use: No  . Drug Use: No  . Sexual Activity: Not on file   Other Topics Concern  . Not on file   Social History Narrative   No CAD known in any siblings.          History reviewed. No pertinent family history.  Past Medical History  Diagnosis Date  . Hyperlipidemia   . LBBB (left bundle branch block)   . Depression   . COPD (chronic obstructive pulmonary disease)   . Calcium oxalate renal stones   . Dizziness   . GERD (gastroesophageal reflux disease)   . Diverticulosis   . IBS (irritable bowel syndrome)   .  Leg cramps     not vascular in origin   . Liver cyst   . Wide-complex tachycardia     post op - on a beta blocker   . Sinus congestion   . Bleeding nose     cauteriszed dr Lazarus Salines  / cauterization repeated 10/11  . Coronary artery disease     Nuclear June 03, 2010, scar anterior wall apex and inferior wall, no definite ischemia,    not gated  . Intolerance of drug     Niaspan  . S/P aortic valve replacement with bioprosthetic valve     echo.[name] April, 2011... good valve function... mild AI  . S/P CABG (coronary artery bypass graft) January, 2007  . LV dysfunction     EF 45% / EF 25-30%, echo, April, 2011 / EF 25-30%, echo, April, 2012, akinesis of the inferior and posterior walls  . Ischemic cardiomyopathy     EF followed carefully  . Carotid artery disease     Doppler, 2011, bilateral 40-59%    /   Doppler, March, 2013, no change, bilateral 40-59%  . BPH (benign prostatic hyperplasia)     2013  . Cough     January, 2014    Past Surgical History  Procedure Laterality Date  .  Cholecystectomy    . Aortic valve replacement  02/2005  . Insert / replace / remove pacemaker    . Coronary artery bypass graft      x5  -- last one in 07  . Lithotripsy      Patient Active Problem List   Diagnosis Date Noted  . Chronic systolic CHF (congestive heart failure) 02/17/2012  . Cough   . S/P aortic valve replacement with bioprosthetic valve   . Hyponatremia 11/26/2011  . Dyspnea on exertion 11/26/2011  . BPH (benign prostatic hyperplasia)   . Carotid artery disease   . Chronic systolic heart failure 02/11/2011  . Coronary artery disease   . Ischemic cardiomyopathy   . Hyperlipidemia   . LBBB (left bundle branch block)   . UNSPECIFIED HEMORRHAGE 12/04/2009  . DIVERTICULOSIS OF COLON 06/05/2009  . COPD 01/19/2009  . GERD 01/19/2009  . AORTIC VALVE REPLACEMENT, HX OF 01/19/2009  . PACEMAKER, PERMANENT 01/19/2009  . CORONARY ARTERY BYPASS GRAFT, HX OF 01/19/2009  . ESSENTIAL HYPERTENSION, BENIGN 11/01/2008    ROS   Patient denies fever, chills, headache, sweats, rash, change in vision, change in hearing, cough, nausea vomiting, urinary symptoms. All other systems are reviewed and are negative.  PHYSICAL EXAM  Patient is oriented to person time and place. Affect is normal. There is no jugulovenous distention. Lungs reveal decreased breath sounds. No rales are heard. Cardiac exam reveals S1 and S2 and a soft systolic murmur. The abdomen is soft. There is no peripheral edema.  Filed Vitals:   10/02/12 1016  BP: 118/58  Pulse: 65  Height: 5\' 7"  (1.702 m)  Weight: 145 lb (65.772 kg)   EKG is done today and reviewed by me. There is normal pacing.  ASSESSMENT & PLAN

## 2012-10-02 NOTE — Patient Instructions (Signed)
Your physician recommends that you continue on your current medications as directed. Please refer to the Current Medication list given to you today.  Your physician wants you to follow-up in: 6 months. You will receive a reminder letter in the mail two months in advance. If you don't receive a letter, please call our office to schedule the follow-up appointment.  

## 2012-10-06 ENCOUNTER — Encounter: Payer: Self-pay | Admitting: Internal Medicine

## 2012-10-06 DIAGNOSIS — I442 Atrioventricular block, complete: Secondary | ICD-10-CM

## 2012-10-24 ENCOUNTER — Encounter: Payer: Self-pay | Admitting: Internal Medicine

## 2012-11-06 ENCOUNTER — Telehealth: Payer: Self-pay | Admitting: Gastroenterology

## 2012-11-06 MED ORDER — OMEPRAZOLE 20 MG PO CPDR: 20.0000 mg | DELAYED_RELEASE_CAPSULE | Freq: Every day | ORAL | Status: DC

## 2012-11-06 NOTE — Telephone Encounter (Signed)
I called patient because patient has not been seen since 2011 Patient made follow up appointment I advised I will send refill but for further refills must keep office appointment Patient verbalized understanding

## 2012-12-01 ENCOUNTER — Ambulatory Visit: Payer: Medicare Other | Admitting: Gastroenterology

## 2013-01-08 ENCOUNTER — Encounter: Payer: Self-pay | Admitting: Internal Medicine

## 2013-01-08 DIAGNOSIS — I442 Atrioventricular block, complete: Secondary | ICD-10-CM

## 2013-01-26 ENCOUNTER — Telehealth: Payer: Self-pay | Admitting: Gastroenterology

## 2013-01-26 MED ORDER — OMEPRAZOLE 20 MG PO CPDR: 20.0000 mg | DELAYED_RELEASE_CAPSULE | Freq: Every day | ORAL | Status: DC

## 2013-01-26 NOTE — Telephone Encounter (Signed)
RX SENT

## 2013-01-29 ENCOUNTER — Other Ambulatory Visit: Payer: Self-pay

## 2013-01-29 MED ORDER — FUROSEMIDE 40 MG PO TABS: 40.0000 mg | ORAL_TABLET | Freq: Every day | ORAL | Status: DC

## 2013-02-05 ENCOUNTER — Other Ambulatory Visit: Payer: Self-pay | Admitting: Cardiology

## 2013-02-15 ENCOUNTER — Ambulatory Visit: Payer: Medicare Other | Admitting: Gastroenterology

## 2013-03-19 ENCOUNTER — Encounter: Payer: Self-pay | Admitting: Cardiology

## 2013-03-19 ENCOUNTER — Ambulatory Visit (INDEPENDENT_AMBULATORY_CARE_PROVIDER_SITE_OTHER): Payer: Medicare Other | Admitting: Cardiology

## 2013-03-19 VITALS — BP 126/64 | HR 64 | Ht 67.0 in | Wt 144.0 lb

## 2013-03-19 DIAGNOSIS — Z950 Presence of cardiac pacemaker: Secondary | ICD-10-CM

## 2013-03-19 DIAGNOSIS — I10 Essential (primary) hypertension: Secondary | ICD-10-CM

## 2013-03-19 DIAGNOSIS — Z954 Presence of other heart-valve replacement: Secondary | ICD-10-CM

## 2013-03-19 DIAGNOSIS — I2589 Other forms of chronic ischemic heart disease: Secondary | ICD-10-CM

## 2013-03-19 DIAGNOSIS — I5022 Chronic systolic (congestive) heart failure: Secondary | ICD-10-CM

## 2013-03-19 DIAGNOSIS — I255 Ischemic cardiomyopathy: Secondary | ICD-10-CM

## 2013-03-19 NOTE — Assessment & Plan Note (Signed)
His pacemaker is followed by our electrophysiology team.

## 2013-03-19 NOTE — Assessment & Plan Note (Signed)
His volume status is nicely controlled. No change in therapy.

## 2013-03-19 NOTE — Assessment & Plan Note (Signed)
Blood pressure is controlled. No change in therapy. 

## 2013-03-19 NOTE — Patient Instructions (Signed)
Your physician recommends that you continue on your current medications as directed. Please refer to the Current Medication list given to you today.  Your physician wants you to follow-up in: 6 months with Dr. Katz.  You will receive a reminder letter in the mail two months in advance. If you don't receive a letter, please call our office to schedule the follow-up appointment.  

## 2013-03-19 NOTE — Assessment & Plan Note (Signed)
Aortic valve prosthesis was working well by echo January, 2013. He does not need an echo at this time.

## 2013-03-19 NOTE — Assessment & Plan Note (Signed)
He is on all the medications that we feel he should be on. No change in therapy.

## 2013-03-19 NOTE — Progress Notes (Signed)
HPI  Patient is seen today to followup his history of left ventricular dysfunction. He has a pacemaker. He is status post aortic valve replacement in the past. He is fully active and looks quite good. He's not having chest pain or shortness of breath.  Allergies  Allergen Reactions  . Niaspan [Niacin Er] Other (See Comments)    Unknown reaction  . Sulfonamide Derivatives Other (See Comments)    Unknown reaction    Current Outpatient Prescriptions  Medication Sig Dispense Refill  . aspirin EC 81 MG tablet Take 81 mg by mouth daily.      . AVODART 0.5 MG capsule Take 1 tablet by mouth Daily.      . carvedilol (COREG) 12.5 MG tablet TAKE 1 TABLET BY MOUTH TWICE DAILY WITH MEAL  180 tablet  2  . docusate sodium (COLACE) 100 MG capsule Take 100 mg by mouth daily as needed.       . furosemide (LASIX) 40 MG tablet Take 1 tablet (40 mg total) by mouth daily.  90 tablet  3  . Multiple Vitamin (MULTIVITAMIN) tablet Take 1 tablet by mouth daily.        . nitroGLYCERIN (NITROSTAT) 0.4 MG SL tablet Place 1 tablet (0.4 mg total) under the tongue every 5 (five) minutes as needed for chest pain.  30 tablet  12  . omeprazole (PRILOSEC) 20 MG capsule Take 1 capsule (20 mg total) by mouth daily.  90 capsule  0  . potassium chloride SA (K-DUR,KLOR-CON) 20 MEQ tablet Take 1 tablet (20 mEq total) by mouth daily.  90 tablet  3  . ramipril (ALTACE) 10 MG capsule Take 1 capsule (10 mg total) by mouth daily.  90 capsule  3  . simvastatin (ZOCOR) 20 MG tablet Take 1 tablet (20 mg total) by mouth at bedtime.  90 tablet  3  . tamsulosin (FLOMAX) 0.4 MG CAPS capsule Take 2 capsules by mouth daily.      . Wheat Dextrin (BENEFIBER) POWD Take by mouth daily.        . Zinc 50 MG TABS Take 50 mg by mouth daily.         No current facility-administered medications for this visit.    History   Social History  . Marital Status: Married    Spouse Name: N/A    Number of Children: N/A  . Years of Education: N/A    Occupational History  . Retired Radiographer, therapeutic    Social History Main Topics  . Smoking status: Former Smoker    Quit date: 02/08/1954  . Smokeless tobacco: Not on file  . Alcohol Use: No  . Drug Use: No  . Sexual Activity: Not on file   Other Topics Concern  . Not on file   Social History Narrative   No CAD known in any siblings.          History reviewed. No pertinent family history.  Past Medical History  Diagnosis Date  . Hyperlipidemia   . LBBB (left bundle branch block)   . Depression   . COPD (chronic obstructive pulmonary disease)   . Calcium oxalate renal stones   . Dizziness   . GERD (gastroesophageal reflux disease)   . Diverticulosis   . IBS (irritable bowel syndrome)   . Leg cramps     not vascular in origin   . Liver cyst   . Wide-complex tachycardia     post op - on a beta blocker   . Sinus  congestion   . Bleeding nose     cauteriszed dr Lazarus SalinesWolicki  / cauterization repeated 10/11  . Coronary artery disease     Nuclear June 03, 2010, scar anterior wall apex and inferior wall, no definite ischemia,    not gated  . Intolerance of drug     Niaspan  . S/P aortic valve replacement with bioprosthetic valve     echo.[name]. April, 2011... good valve function... mild AI  . S/P CABG (coronary artery bypass graft) January, 2007  . LV dysfunction     EF 45% / EF 25-30%, echo, April, 2011 / EF 25-30%, echo, April, 2012, akinesis of the inferior and posterior walls  . Ischemic cardiomyopathy     EF followed carefully  . Carotid artery disease     Doppler, 2011, bilateral 40-59%    /   Doppler, March, 2013, no change, bilateral 40-59%  . BPH (benign prostatic hyperplasia)     2013  . Cough     January, 2014  . Ejection fraction < 50%     Past Surgical History  Procedure Laterality Date  . Cholecystectomy    . Aortic valve replacement  02/2005  . Insert / replace / remove pacemaker    . Coronary artery bypass graft      x5  -- last one in 07  . Lithotripsy       Patient Active Problem List   Diagnosis Date Noted  . Ejection fraction < 50%   . Chronic systolic CHF (congestive heart failure) 02/17/2012  . Cough   . S/P aortic valve replacement with bioprosthetic valve   . Hyponatremia 11/26/2011  . Dyspnea on exertion 11/26/2011  . BPH (benign prostatic hyperplasia)   . Carotid artery disease   . Chronic systolic heart failure 02/11/2011  . Coronary artery disease   . Ischemic cardiomyopathy   . Hyperlipidemia   . LBBB (left bundle branch block)   . UNSPECIFIED HEMORRHAGE 12/04/2009  . DIVERTICULOSIS OF COLON 06/05/2009  . COPD 01/19/2009  . GERD 01/19/2009  . AORTIC VALVE REPLACEMENT, HX OF 01/19/2009  . PACEMAKER, PERMANENT 01/19/2009  . CORONARY ARTERY BYPASS GRAFT, HX OF 01/19/2009  . ESSENTIAL HYPERTENSION, BENIGN 11/01/2008    ROS  Patient denies fever, chills, headache, sweats, rash, change in vision, change in hearing, chest pain, cough, nausea vomiting. He does have some decreased urinary output related to his prostate. He is followed by urology for this. All other systems are reviewed and are negative.   PHYSICAL EXAM  Patient is oriented to person time and place. Affect is normal. There is no jugulovenous distention. Lungs are clear. Respiratory effort is nonlabored. Cardiac exam reveals S1 and S2. There are no clicks or significant murmurs. Abdomen is soft. There is no peripheral edema.  Filed Vitals:   03/19/13 1031  BP: 126/64  Pulse: 64  Height: 5\' 7"  (1.702 m)  Weight: 144 lb (65.318 kg)     ASSESSMENT & PLAN

## 2013-04-09 DIAGNOSIS — I442 Atrioventricular block, complete: Secondary | ICD-10-CM

## 2013-04-11 ENCOUNTER — Other Ambulatory Visit: Payer: Self-pay

## 2013-04-11 ENCOUNTER — Other Ambulatory Visit: Payer: Self-pay | Admitting: *Deleted

## 2013-04-11 DIAGNOSIS — I5022 Chronic systolic (congestive) heart failure: Secondary | ICD-10-CM

## 2013-04-11 DIAGNOSIS — E785 Hyperlipidemia, unspecified: Secondary | ICD-10-CM

## 2013-04-11 DIAGNOSIS — I255 Ischemic cardiomyopathy: Secondary | ICD-10-CM

## 2013-04-11 DIAGNOSIS — Z952 Presence of prosthetic heart valve: Secondary | ICD-10-CM

## 2013-04-11 DIAGNOSIS — I442 Atrioventricular block, complete: Secondary | ICD-10-CM

## 2013-04-11 DIAGNOSIS — I359 Nonrheumatic aortic valve disorder, unspecified: Secondary | ICD-10-CM

## 2013-04-11 DIAGNOSIS — Z950 Presence of cardiac pacemaker: Secondary | ICD-10-CM

## 2013-04-11 DIAGNOSIS — I2510 Atherosclerotic heart disease of native coronary artery without angina pectoris: Secondary | ICD-10-CM

## 2013-04-11 MED ORDER — FUROSEMIDE 40 MG PO TABS: 40.0000 mg | ORAL_TABLET | Freq: Every day | ORAL | Status: DC

## 2013-04-11 MED ORDER — SIMVASTATIN 40 MG PO TABS: 40.0000 mg | ORAL_TABLET | Freq: Every day | ORAL | Status: DC

## 2013-04-11 MED ORDER — CARVEDILOL 12.5 MG PO TABS: ORAL_TABLET | ORAL | Status: DC

## 2013-04-11 MED ORDER — RAMIPRIL 10 MG PO CAPS: 10.0000 mg | ORAL_CAPSULE | Freq: Every day | ORAL | Status: DC

## 2013-04-11 MED ORDER — POTASSIUM CHLORIDE CRYS ER 20 MEQ PO TBCR: 20.0000 meq | EXTENDED_RELEASE_TABLET | Freq: Every day | ORAL | Status: DC

## 2013-04-11 MED ORDER — SIMVASTATIN 20 MG PO TABS: 20.0000 mg | ORAL_TABLET | Freq: Every day | ORAL | Status: DC

## 2013-06-08 ENCOUNTER — Encounter: Payer: Self-pay | Admitting: Internal Medicine

## 2013-06-19 ENCOUNTER — Telehealth: Payer: Self-pay | Admitting: Cardiology

## 2013-06-19 NOTE — Telephone Encounter (Addendum)
New message  Bill Cunningham with Dr. Patsi Searsannenbaum off req clearance for a TURP procedure. Bill Cunningham states that she will forward form for completion.  Please fax back to [phone]

## 2013-06-19 NOTE — Telephone Encounter (Signed)
I spoke with Pam at  Dr. Imelda Pillowannenbaum's office. Pt is not scheduled yet for TURP pending clearance with Dr. Myrtis SerKatz. This is not an emergency but would like this scheduled in June.  Forwarded to Dr. Myrtis SerKatz for clearance Mylo Redebbie Anaisa Radi RN

## 2013-06-22 ENCOUNTER — Other Ambulatory Visit: Payer: Self-pay | Admitting: Urology

## 2013-06-22 NOTE — Telephone Encounter (Signed)
The patient is cleared for his urologic procedure. Please let the patient and his doctor know.

## 2013-06-22 NOTE — Telephone Encounter (Signed)
**Note De-Identified  Obfuscation** I advised Pam at Dr Imelda Pillowannenbaum's office that I have not received a clearance form for the pts TURP surgery yet. I offered to fax her this phone note which is clearing the pt for his surgery, she stated that this note should be enough. I advised her that if she needs more to call me back. She verbalized understanding and thanked me for my help.

## 2013-07-03 ENCOUNTER — Other Ambulatory Visit: Payer: Self-pay

## 2013-07-03 MED ORDER — RAMIPRIL 10 MG PO CAPS: 10.0000 mg | ORAL_CAPSULE | Freq: Every day | ORAL | Status: DC

## 2013-07-08 ENCOUNTER — Emergency Department (HOSPITAL_COMMUNITY)
Admission: EM | Admit: 2013-07-08 | Discharge: 2013-07-08 | Disposition: A | Discharge status: Home / Self Care | Payer: Medicare Other | Attending: Emergency Medicine | Admitting: Emergency Medicine

## 2013-07-08 ENCOUNTER — Emergency Department (HOSPITAL_COMMUNITY): Payer: Medicare Other

## 2013-07-08 ENCOUNTER — Encounter (HOSPITAL_COMMUNITY): Payer: Self-pay | Admitting: Emergency Medicine

## 2013-07-08 DIAGNOSIS — Z79899 Other long term (current) drug therapy: Secondary | ICD-10-CM | POA: Insufficient documentation

## 2013-07-08 DIAGNOSIS — Z87891 Personal history of nicotine dependence: Secondary | ICD-10-CM | POA: Insufficient documentation

## 2013-07-08 DIAGNOSIS — J441 Chronic obstructive pulmonary disease with (acute) exacerbation: Secondary | ICD-10-CM | POA: Insufficient documentation

## 2013-07-08 DIAGNOSIS — Z8739 Personal history of other diseases of the musculoskeletal system and connective tissue: Secondary | ICD-10-CM | POA: Insufficient documentation

## 2013-07-08 DIAGNOSIS — R0982 Postnasal drip: Secondary | ICD-10-CM | POA: Insufficient documentation

## 2013-07-08 DIAGNOSIS — K219 Gastro-esophageal reflux disease without esophagitis: Secondary | ICD-10-CM | POA: Insufficient documentation

## 2013-07-08 DIAGNOSIS — N400 Enlarged prostate without lower urinary tract symptoms: Secondary | ICD-10-CM | POA: Insufficient documentation

## 2013-07-08 DIAGNOSIS — R05 Cough: Secondary | ICD-10-CM | POA: Insufficient documentation

## 2013-07-08 DIAGNOSIS — Z8659 Personal history of other mental and behavioral disorders: Secondary | ICD-10-CM | POA: Insufficient documentation

## 2013-07-08 DIAGNOSIS — Z87442 Personal history of urinary calculi: Secondary | ICD-10-CM | POA: Insufficient documentation

## 2013-07-08 DIAGNOSIS — R0602 Shortness of breath: Secondary | ICD-10-CM

## 2013-07-08 DIAGNOSIS — E785 Hyperlipidemia, unspecified: Secondary | ICD-10-CM | POA: Insufficient documentation

## 2013-07-08 DIAGNOSIS — Z950 Presence of cardiac pacemaker: Secondary | ICD-10-CM | POA: Insufficient documentation

## 2013-07-08 DIAGNOSIS — Z951 Presence of aortocoronary bypass graft: Secondary | ICD-10-CM | POA: Insufficient documentation

## 2013-07-08 DIAGNOSIS — I2510 Atherosclerotic heart disease of native coronary artery without angina pectoris: Secondary | ICD-10-CM | POA: Insufficient documentation

## 2013-07-08 DIAGNOSIS — R059 Cough, unspecified: Secondary | ICD-10-CM | POA: Insufficient documentation

## 2013-07-08 DIAGNOSIS — Z7982 Long term (current) use of aspirin: Secondary | ICD-10-CM | POA: Insufficient documentation

## 2013-07-08 LAB — CBC WITH DIFFERENTIAL/PLATELET
BASOS PCT: 0 % (ref 0–1)
Basophils Absolute: 0 10*3/uL (ref 0.0–0.1)
EOS ABS: 0.1 10*3/uL (ref 0.0–0.7)
Eosinophils Relative: 1 % (ref 0–5)
HCT: 32.8 % — ABNORMAL LOW (ref 39.0–52.0)
HEMOGLOBIN: 11.3 g/dL — AB (ref 13.0–17.0)
LYMPHS ABS: 1.5 10*3/uL (ref 0.7–4.0)
Lymphocytes Relative: 15 % (ref 12–46)
MCH: 28.9 pg (ref 26.0–34.0)
MCHC: 34.5 g/dL (ref 30.0–36.0)
MCV: 83.9 fL (ref 78.0–100.0)
MONOS PCT: 9 % (ref 3–12)
Monocytes Absolute: 0.9 10*3/uL (ref 0.1–1.0)
NEUTROS ABS: 7.4 10*3/uL (ref 1.7–7.7)
NEUTROS PCT: 75 % (ref 43–77)
Platelets: 239 10*3/uL (ref 150–400)
RBC: 3.91 MIL/uL — AB (ref 4.22–5.81)
RDW: 12.5 % (ref 11.5–15.5)
WBC: 9.9 10*3/uL (ref 4.0–10.5)

## 2013-07-08 LAB — COMPREHENSIVE METABOLIC PANEL
ALT: 10 U/L (ref 0–53)
AST: 17 U/L (ref 0–37)
Albumin: 3.8 g/dL (ref 3.5–5.2)
Alkaline Phosphatase: 62 U/L (ref 39–117)
BILIRUBIN TOTAL: 0.5 mg/dL (ref 0.3–1.2)
BUN: 17 mg/dL (ref 6–23)
CHLORIDE: 92 meq/L — AB (ref 96–112)
CO2: 24 meq/L (ref 19–32)
Calcium: 9.4 mg/dL (ref 8.4–10.5)
Creatinine, Ser: 1.28 mg/dL (ref 0.50–1.35)
GFR, EST AFRICAN AMERICAN: 55 mL/min — AB (ref >90)
GFR, EST NON AFRICAN AMERICAN: 48 mL/min — AB (ref >90)
Glucose, Bld: 116 mg/dL — ABNORMAL HIGH (ref 70–99)
POTASSIUM: 5.5 meq/L — AB (ref 3.7–5.3)
SODIUM: 129 meq/L — AB (ref 137–147)
Total Protein: 6.6 g/dL (ref 6.0–8.3)

## 2013-07-08 LAB — I-STAT TROPONIN, ED: Troponin i, poc: 0.01 ng/mL (ref 0.00–0.08)

## 2013-07-08 LAB — PRO B NATRIURETIC PEPTIDE: PRO B NATRI PEPTIDE: 9136 pg/mL — AB (ref 0–450)

## 2013-07-08 MED ORDER — FEXOFENADINE HCL 60 MG PO TABS: 60.0000 mg | ORAL_TABLET | Freq: Two times a day (BID) | ORAL | Status: DC

## 2013-07-08 NOTE — ED Notes (Signed)
Bed: IA16 Expected date:  Expected time:  Means of arrival:  Comments: Triage 1, ekg being done

## 2013-07-08 NOTE — Discharge Instructions (Signed)
Increase your lasix dose to 1 1/2 tabs daily (60mg ) and stop your potassium pill until you see [name] on Tuesday Shortness of Breath Shortness of breath means you have trouble breathing. Shortness of breath needs medical care right away. HOME CARE   Do not smoke.  Avoid being around chemicals or things (paint fumes, dust) that may bother your breathing.  Rest as needed. Slowly begin your normal activities.  Only take medicines as told by your doctor.  Keep all doctor visits as told. GET HELP RIGHT AWAY IF:   Your shortness of breath gets worse.  You feel lightheaded, pass out (faint), or have a cough that is not helped by medicine.  You cough up blood.  You have pain with breathing.  You have pain in your chest, arms, shoulders, or belly (abdomen).  You have a fever.  You cannot walk up stairs or exercise the way you normally do.  You do not get better in the time expected.  You have a hard time doing normal activities even with rest.  You have problems with your medicines.  You have any new symptoms. MAKE SURE YOU:  Understand these instructions.  Will watch your condition.  Will get help right away if you are not doing well or get worse. Document Released: 07/14/2007 Document Revised: 07/27/2011 Document Reviewed: 04/12/2011 [facility] Patient Information 2014 [place], [place].

## 2013-07-08 NOTE — ED Provider Notes (Addendum)
CSN: [id number]     Arrival date & time 07/08/13  0844 History   First MD Initiated Contact with Patient 07/08/13 [phone]     Chief Complaint  Patient presents with  . Shortness of Breath     (Consider location/radiation/quality/duration/timing/severity/associated sxs/prior Treatment) Patient is a 80 y.o. male presenting with shortness of breath. The history is provided by the patient.  Shortness of Breath Severity:  Mild Onset quality:  Sudden Duration: seconds. Timing:  Sporadic Progression:  Resolved Chronicity:  New Context comment:  States for the last 2-3 weeks has second episodes of his breath getting cut off when his nose gets clogged that resolves he he uses a throat lozenge Associated symptoms: cough   Associated symptoms: no abdominal pain, no chest pain, no fever, no headaches, no sore throat, no sputum production, no vomiting and no wheezing   Associated symptoms comment:  No weight gain.  No DOE or orthopnea Risk factors comment:  Extensive cardiac hx, chronic post nasal drip   Past Medical History  Diagnosis Date  . Hyperlipidemia   . LBBB (left bundle branch block)   . Depression   . COPD (chronic obstructive pulmonary disease)   . Calcium oxalate renal stones   . Dizziness   . GERD (gastroesophageal reflux disease)   . Diverticulosis   . IBS (irritable bowel syndrome)   . Leg cramps     not vascular in origin   . Liver cyst   . Wide-complex tachycardia     post op - on a beta blocker   . Sinus congestion   . Bleeding nose     cauteriszed dr Lazarus Salines  / cauterization repeated 10/11  . Coronary artery disease     Nuclear June 03, 2010, scar anterior wall apex and inferior wall, no definite ischemia,    not gated  . Intolerance of drug     Niaspan  . S/P aortic valve replacement with bioprosthetic valve     echo.[name] April, 2011... good valve function... mild AI  . S/P CABG (coronary artery bypass graft) January, 2007  . LV dysfunction     EF 45% / EF  25-30%, echo, April, 2011 / EF 25-30%, echo, April, 2012, akinesis of the inferior and posterior walls  . Ischemic cardiomyopathy     EF followed carefully  . Carotid artery disease     Doppler, 2011, bilateral 40-59%    /   Doppler, March, 2013, no change, bilateral 40-59%  . BPH (benign prostatic hyperplasia)     2013  . Cough     January, 2014  . Ejection fraction < 50%    Past Surgical History  Procedure Laterality Date  . Cholecystectomy    . Aortic valve replacement  02/2005  . Insert / replace / remove pacemaker    . Coronary artery bypass graft      x5  -- last one in 07  . Lithotripsy     No family history on file. History  Substance Use Topics  . Smoking status: Former Smoker    Quit date: 02/08/1954  . Smokeless tobacco: Not on file  . Alcohol Use: No    Review of Systems  Constitutional: Negative for fever, chills, appetite change and unexpected weight change.  HENT: Positive for postnasal drip and rhinorrhea. Negative for sore throat and trouble swallowing.   Respiratory: Positive for cough and shortness of breath. Negative for sputum production and wheezing.   Cardiovascular: Negative for chest pain.  Gastrointestinal: Negative for  nausea, vomiting, abdominal pain and diarrhea.  Genitourinary: Negative for flank pain.  Neurological: Negative for weakness and headaches.  All other systems reviewed and are negative.     Allergies  Niaspan and Sulfonamide derivatives  Home Medications   Prior to Admission medications   Medication Sig Start Date End Date Taking? Authorizing Provider  aspirin EC 81 MG tablet Take 81 mg by mouth daily.    Historical Provider, MD  AVODART 0.5 MG capsule Take 1 tablet by mouth Daily. 09/28/11   Historical Provider, MD  carvedilol (COREG) 12.5 MG tablet TAKE 1 TABLET BY MOUTH TWICE DAILY WITH MEAL 04/11/13   Luis AbedJeffrey D Katz, MD  docusate sodium (COLACE) 100 MG capsule Take 100 mg by mouth daily as needed.     Historical Provider,  MD  furosemide (LASIX) 40 MG tablet Take 1 tablet (40 mg total) by mouth daily. 04/11/13   Luis AbedJeffrey D Katz, MD  Multiple Vitamin (MULTIVITAMIN) tablet Take 1 tablet by mouth daily.      Historical Provider, MD  nitroGLYCERIN (NITROSTAT) 0.4 MG SL tablet Place 1 tablet (0.4 mg total) under the tongue every 5 (five) minutes as needed for chest pain. 11/28/11   Richarda OverlieNayana Abrol, MD  omeprazole (PRILOSEC) 20 MG capsule Take 1 capsule (20 mg total) by mouth daily. 01/26/13 01/26/14  Mardella Laymanavid R Patterson, MD  potassium chloride SA (K-DUR,KLOR-CON) 20 MEQ tablet Take 1 tablet (20 mEq total) by mouth daily. 04/11/13   Luis AbedJeffrey D Katz, MD  ramipril (ALTACE) 10 MG capsule Take 1 capsule (10 mg total) by mouth daily. 07/03/13   Luis AbedJeffrey D Katz, MD  simvastatin (ZOCOR) 40 MG tablet Take 1 tablet (40 mg total) by mouth at bedtime. 04/11/13   Luis AbedJeffrey D Katz, MD  tamsulosin (FLOMAX) 0.4 MG CAPS capsule Take 2 capsules by mouth daily. 02/07/13   Historical Provider, MD  Wheat Dextrin (BENEFIBER) POWD Take by mouth daily.      Historical Provider, MD  Zinc 50 MG TABS Take 50 mg by mouth daily.      Historical Provider, MD   BP 126/66  Pulse 64  Temp(Src) 97.9 F (36.6 C) (Oral)  Resp 14  SpO2 100% Physical Exam  Nursing note and vitals reviewed. Constitutional: He is oriented to person, place, and time. He appears well-developed and well-nourished. No distress.  HENT:  Head: Normocephalic and atraumatic.  Right Ear: Tympanic membrane and ear canal normal.  Left Ear: Tympanic membrane and ear canal normal.  Nose: Rhinorrhea present.  Mouth/Throat: Oropharynx is clear and moist.  Clear constant fluid draining from nose.  Small ulcer on the right septum that is not bleeding  Eyes: Conjunctivae and EOM are normal. Pupils are equal, round, and reactive to light.  Neck: Normal range of motion. Neck supple.  Cardiovascular: Normal rate, regular rhythm and intact distal pulses.   No murmur heard. Pulmonary/Chest: Effort  normal and breath sounds normal. No respiratory distress. He has no wheezes. He has no rales. He exhibits no tenderness.  Abdominal: Soft. He exhibits no distension. There is no tenderness. There is no rebound and no guarding.  Musculoskeletal: Normal range of motion. He exhibits no edema and no tenderness.  Neurological: He is alert and oriented to person, place, and time.  Skin: Skin is warm and dry. No rash noted. No erythema.  Psychiatric: He has a normal mood and affect. His behavior is normal.    ED Course  Procedures (including critical care time) Labs Review Labs Reviewed  CBC WITH  DIFFERENTIAL - Abnormal; Notable for the following:    RBC 3.91 (*)    Hemoglobin 11.3 (*)    HCT 32.8 (*)    All other components within normal limits  COMPREHENSIVE METABOLIC PANEL - Abnormal; Notable for the following:    Sodium 129 (*)    Potassium 5.5 (*)    Chloride 92 (*)    Glucose, Bld 116 (*)    GFR calc non Af Amer 48 (*)    GFR calc Af Amer 55 (*)    All other components within normal limits  PRO B NATRIURETIC PEPTIDE - Abnormal; Notable for the following:    Pro B Natriuretic peptide (BNP) 9136.0 (*)    All other components within normal limits  I-STAT TROPOININ, ED    Imaging Review Dg Chest 2 View  07/08/2013   CLINICAL DATA:  Cough and shortness of breath  EXAM: CHEST  2 VIEW  COMPARISON:  11/26/2011  FINDINGS: Cardiac shadow is stable. Postoperative changes as well is a pacemaker are again seen. The lungs are well aerated bilaterally. Chronic blunting of the right costophrenic angle is noted. Calcified pleural plaques are noted bilaterally. No new focal abnormality is seen.  IMPRESSION: Chronic changes without acute abnormality.   Electronically Signed   By: Alcide Clever M.D.   On: 07/08/2013 10:21     EKG Interpretation   Date/Time:  Sunday Jul 08 2013 09:04:24 EDT Ventricular Rate:  70 PR Interval:  175 QRS Duration: 180 QT Interval:  482 QTC Calculation: 520 R Axis:    -105 Text Interpretation:  A-V dual-paced complexes w/ some inhibition No  further analysis attempted due to paced rhythm No significant change since  last tracing Confirmed by Anitra Lauth  MD, Alphonzo Lemmings ([id number]) on 07/08/2013  9:14:29 AM      MDM   Final diagnoses:  SOB (shortness of breath)  Post-nasal drip   Patient with a history of aortic valve replacement, CABG, ischemic cardiomyopathy with a pacemaker present who presents today with 2-3 weeks of intermittent shortness of breath that he relates to his allergies. He states for one to 2 seconds his breath will be cut off when he is breathing through his nose which resolves if he opens his mouth and uses a throat lozenge.  He has had a dry cough because of a tickle in the back of his throat but denies fever, productive cough, chest pain. He checks his weight regularly and it has been stable without significant edema. Denies orthopnea or dyspnea on exertion. He does not take anything regularly for allergies but states he has a constant postnasal drip and sinus congestion.  However given symptoms have been going on for several weeks he came here for further evaluation to ensure no other issues.  At this point based on patient's story low suspicion for PE. Less likely to be cardiac given history however with his extensive medical history ensured no atypical ACS or CHF. Denies any GI symptoms concerning for GERD and has no abdominal pain. Denies any urinary symptoms and states he is scheduled for a TURP this month  EKG shows a paced rhythm without change. CBC, CMP, BNP, troponin, chest x-ray pending  11:17 AM Labs show mild elevation of BNP to 9,000 from 8,000 2 years ago.   CMP with mild elevation of K but normal Creatinine and pt on potassium supplement and recommended holding the dose for several days.  Troponin neg and CXR wnl.    11:43 AM On re-eval and discussing  hx pt changed his story and states for the last several months he has been more  fatigued with exertion while working in his garden and has even noticed recently some fatigue and mild SOB when walking down the hall which he thinks has been a month.  He is taking lasix daily.  However given this hx and mild elevation of BNP from prior baseline will have pt increase his lasix dose to 60mg  daily until seeing [name] who he has an appt with on tues.  He will also hold potassium until that time.  Also given allegra for post nasal gtt.   Gwyneth Sprout, MD 07/08/13 1118  Gwyneth Sprout, MD 07/08/13 1145  Gwyneth Sprout, MD 07/08/13 1151

## 2013-07-08 NOTE — ED Notes (Signed)
Pt reports shob intermittently over last 2-3 weeks. Has "spells" of shob. Feels like he can't catch his breath. Only last less than a minute. Denies chest pain, n/v. Has had "an episode or two of sweating." Has PM. Pt sts he has been blaming it on sinuses and allergies because if he uses a throat lozenge when it happens, it helps "open up" his sinuses and he feels better.

## 2013-07-09 ENCOUNTER — Encounter: Payer: Self-pay | Admitting: Internal Medicine

## 2013-07-09 DIAGNOSIS — I442 Atrioventricular block, complete: Secondary | ICD-10-CM

## 2013-07-20 ENCOUNTER — Ambulatory Visit (INDEPENDENT_AMBULATORY_CARE_PROVIDER_SITE_OTHER): Payer: Medicare Other | Admitting: Cardiology

## 2013-07-20 ENCOUNTER — Encounter: Payer: Self-pay | Admitting: Cardiology

## 2013-07-20 VITALS — BP 120/62 | HR 70 | Ht 67.0 in | Wt 144.0 lb

## 2013-07-20 DIAGNOSIS — I10 Essential (primary) hypertension: Secondary | ICD-10-CM

## 2013-07-20 DIAGNOSIS — I5022 Chronic systolic (congestive) heart failure: Secondary | ICD-10-CM

## 2013-07-20 DIAGNOSIS — Z950 Presence of cardiac pacemaker: Secondary | ICD-10-CM

## 2013-07-20 DIAGNOSIS — R0989 Other specified symptoms and signs involving the circulatory and respiratory systems: Secondary | ICD-10-CM

## 2013-07-20 DIAGNOSIS — I2589 Other forms of chronic ischemic heart disease: Secondary | ICD-10-CM

## 2013-07-20 DIAGNOSIS — N183 Chronic kidney disease, stage 3 unspecified: Secondary | ICD-10-CM

## 2013-07-20 DIAGNOSIS — Z954 Presence of other heart-valve replacement: Secondary | ICD-10-CM

## 2013-07-20 DIAGNOSIS — I509 Heart failure, unspecified: Secondary | ICD-10-CM

## 2013-07-20 DIAGNOSIS — N401 Enlarged prostate with lower urinary tract symptoms: Secondary | ICD-10-CM | POA: Insufficient documentation

## 2013-07-20 DIAGNOSIS — R338 Other retention of urine: Secondary | ICD-10-CM

## 2013-07-20 DIAGNOSIS — R0609 Other forms of dyspnea: Secondary | ICD-10-CM

## 2013-07-20 DIAGNOSIS — I255 Ischemic cardiomyopathy: Secondary | ICD-10-CM

## 2013-07-20 DIAGNOSIS — J449 Chronic obstructive pulmonary disease, unspecified: Secondary | ICD-10-CM

## 2013-07-20 DIAGNOSIS — I2510 Atherosclerotic heart disease of native coronary artery without angina pectoris: Secondary | ICD-10-CM

## 2013-07-20 DIAGNOSIS — R339 Retention of urine, unspecified: Secondary | ICD-10-CM

## 2013-07-20 NOTE — Assessment & Plan Note (Signed)
He does have some renal dysfunction at this time. It is relatively stable.

## 2013-07-20 NOTE — Assessment & Plan Note (Addendum)
His pacemaker is in place. His pacemaker was interrogated today in the office. I reviewed the data with our technical team. His pacer function is good.

## 2013-07-20 NOTE — Assessment & Plan Note (Signed)
The patient appears to have some urinary outlet obstruction. He procedure is being considered. We'll need to be sure that his cardiac status is stable first.

## 2013-07-20 NOTE — Assessment & Plan Note (Signed)
His ejection fraction was 35-40% in January, 2013. 2-D echo is needed now to reassess his LV function.

## 2013-07-20 NOTE — Progress Notes (Signed)
Patient ID: Bill Cunningham, male   DOB: May 06, 1921, 80 y.o.   MRN: [id number]    HPI  The patient is seen today to followup coronary disease and left ventricular dysfunction. I saw him last in February, 2015. He did have a trip to the emergency room a the 31st, 2015. At that time it appeared that he was volume overloaded. He received a higher dose of his diuretics. He is seen his primary physician since then who felt he was stable. Labs were checked. BUN was 21 with a creatinine 1.35. Potassium was 4.8. The patient says that he is breathing somewhat better. However he is having exertional fatigue at fairly short distances that he has not had in the past. He's not having any chest pain. In addition he has a urologic problem. A surgical procedure is being considered. However everyone wants to be sure that his cardiac status is stable for this.  Allergies  Allergen Reactions  . Niaspan [Niacin Er] Other (See Comments)    Unknown reaction  . Sulfonamide Derivatives Other (See Comments)    Unknown reaction    Current Outpatient Prescriptions  Medication Sig Dispense Refill  . aspirin EC 81 MG tablet Take 81 mg by mouth daily.      . AVODART 0.5 MG capsule Take 1 tablet by mouth Daily.      . carvedilol (COREG) 12.5 MG tablet Take 12.5 mg by mouth 2 (two) times daily with a meal.      . docusate sodium (COLACE) 100 MG capsule Take 100 mg by mouth daily as needed for mild constipation.       . fexofenadine (ALLEGRA) 60 MG tablet Take 1 tablet (60 mg total) by mouth 2 (two) times daily.  60 tablet  0  . furosemide (LASIX) 40 MG tablet Take 60 mg by mouth daily. Take one and a half tablets by mouth daily.      . Multiple Vitamin (MULTIVITAMIN) tablet Take 1 tablet by mouth daily.        . nitroGLYCERIN (NITROSTAT) 0.4 MG SL tablet Place 1 tablet (0.4 mg total) under the tongue every 5 (five) minutes as needed for chest pain.  30 tablet  12  . omeprazole (PRILOSEC) 20 MG capsule Take 1 capsule (20 mg  total) by mouth daily.  90 capsule  0  . potassium chloride (K-DUR,KLOR-CON) 10 MEQ tablet Take 10 mEq by mouth once.       . ramipril (ALTACE) 10 MG capsule Take 10 mg by mouth at bedtime.      . simvastatin (ZOCOR) 40 MG tablet Take 1 tablet (40 mg total) by mouth at bedtime.  90 tablet  3  . tamsulosin (FLOMAX) 0.4 MG CAPS capsule Take 2 capsules by mouth daily.      . Wheat Dextrin (BENEFIBER) POWD Take 1 scoop by mouth daily.       . Zinc 50 MG TABS Take 50 mg by mouth daily.         No current facility-administered medications for this visit.    History   Social History  . Marital Status: Married    Spouse Name: N/A    Number of Children: N/A  . Years of Education: N/A   Occupational History  . Retired Radiographer, [employer]    Social History Main Topics  . Smoking status: Former Smoker    Quit date: 02/08/1954  . Smokeless tobacco: Not on file  . Alcohol Use: No  . Drug Use: No  . Sexual  Activity: Not on file   Other Topics Concern  . Not on file   Social History Narrative   No CAD known in any siblings.          History reviewed. No pertinent family history.  Past Medical History  Diagnosis Date  . Hyperlipidemia   . LBBB (left bundle branch block)   . Depression   . COPD (chronic obstructive pulmonary disease)   . Calcium oxalate renal stones   . Dizziness   . GERD (gastroesophageal reflux disease)   . Diverticulosis   . IBS (irritable bowel syndrome)   . Leg cramps     not vascular in origin   . Liver cyst   . Wide-complex tachycardia     post op - on a beta blocker   . Sinus congestion   . Bleeding nose     cauteriszed dr Lazarus Salines  / cauterization repeated 10/11  . Coronary artery disease     Nuclear June 03, 2010, scar anterior wall apex and inferior wall, no definite ischemia,    not gated  . Intolerance of drug     Niaspan  . S/P aortic valve replacement with bioprosthetic valve     echo.[name] April, 2011... good valve function... mild AI  . S/P CABG  (coronary artery bypass graft) January, 2007  . LV dysfunction     EF 45% / EF 25-30%, echo, April, 2011 / EF 25-30%, echo, April, 2012, akinesis of the inferior and posterior walls  . Ischemic cardiomyopathy     EF followed carefully  . Carotid artery disease     Doppler, 2011, bilateral 40-59%    /   Doppler, March, 2013, no change, bilateral 40-59%  . BPH (benign prostatic hyperplasia)     2013  . Cough     January, 2014  . Ejection fraction < 50%     Past Surgical History  Procedure Laterality Date  . Cholecystectomy    . Aortic valve replacement  02/2005  . Insert / replace / remove pacemaker    . Coronary artery bypass graft      x5  -- last one in 07  . Lithotripsy      Patient Active Problem List   Diagnosis Date Noted  . Ejection fraction < 50%   . Chronic systolic CHF (congestive heart failure) 02/17/2012  . Cough   . S/P aortic valve replacement with bioprosthetic valve   . Hyponatremia 11/26/2011  . Dyspnea on exertion 11/26/2011  . BPH (benign prostatic hyperplasia)   . Carotid artery disease   . Coronary artery disease   . Ischemic cardiomyopathy   . Hyperlipidemia   . LBBB (left bundle branch block)   . UNSPECIFIED HEMORRHAGE 12/04/2009  . DIVERTICULOSIS OF COLON 06/05/2009  . COPD 01/19/2009  . GERD 01/19/2009  . AORTIC VALVE REPLACEMENT, HX OF 01/19/2009  . PACEMAKER, PERMANENT 01/19/2009  . CORONARY ARTERY BYPASS GRAFT, HX OF 01/19/2009  . ESSENTIAL HYPERTENSION, BENIGN 11/01/2008    ROS   Patient denies fever, chills, headache, sweats, rash, change in vision, change in hearing, chest pain, cough, nausea or vomiting, urinary symptoms. All other systems are reviewed and are negative.  PHYSICAL EXAM  Patient is oriented to person time and place. Affect is normal. He is 80 years of age. He does appear a little more frail than he has in the past. Head is atraumatic. Sclera and conjunctiva are normal. There is no jugulovenous distention. Lungs are  clear. Respiratory effort is nonlabored.  Cardiac exam reveals S1 and S2. There is a systolic murmur. The abdomen is soft. There is no peripheral edema. There are no musculoskeletal deformities. There are no skin rashes.  Filed Vitals:   07/20/13 1451  BP: 120/62  Pulse: 70  Height: 5\' 7"  (1.702 m)  Weight: 144 lb (65.318 kg)   I reviewed his EKG from the emergency room, July 09, 2013. The rhythm was paced.  ASSESSMENT & PLAN

## 2013-07-20 NOTE — Assessment & Plan Note (Addendum)
Am concerned about his dyspnea with exertion. Echo will be done to reassess LV function and aortic valve function. The patient walked in the office. His O2 sat was 97% at rest. With walking in the office his heart rate went from 68-88. O2 sat went from 97% down to 93%.. He had some fatigue with his walking. This information does not appear to confirm any particular basis for his exertional fatigue.  As part of today's evaluation I find greater than 25 minutes with is total care. More than half of this time has been with an extensive discussion with him. This included the fact that he may want to have urologic surgery in the future. I am still not sure if he can be cleared.

## 2013-07-20 NOTE — Assessment & Plan Note (Signed)
He has known coronary disease. His nuclear scan in April, 2012, revealed no ischemia. I've chosen not to proceed with exercise testing at this time.

## 2013-07-20 NOTE — Assessment & Plan Note (Signed)
Blood pressure is controlled. No change in therapy. 

## 2013-07-20 NOTE — Assessment & Plan Note (Signed)
His aortic prosthesis was working well by echo in January, 2013. Because he is having increasing symptoms of fatigue, two-dimensional echo will be done.

## 2013-07-20 NOTE — Assessment & Plan Note (Addendum)
The patient has had significant COPD for many years. This may be playing a role with his increased fatigue with exercise. His EKG in the emergency room on Jul 08, 2013 did not show any CHF. He had chronic changes.

## 2013-07-20 NOTE — Assessment & Plan Note (Signed)
The patient's Lasix dose was recently increased from 40-60 mg daily. Followup renal function shows only a very slight increase in his creatinine. I will leave the dose at 60 mg.

## 2013-07-20 NOTE — Patient Instructions (Signed)
Your physician recommends that you continue on your current medications as directed. Please refer to the Current Medication list given to you today. Your physician has requested that you have an echocardiogram. Echocardiography is a painless test that uses sound waves to create images of your heart. It provides your doctor with information about the size and shape of your heart and how well your heart's chambers and valves are working. This procedure takes approximately one hour. There are no restrictions for this procedure. Your physician recommends that you schedule a follow-up appointment in: 6 weeks. 

## 2013-07-26 ENCOUNTER — Inpatient Hospital Stay (HOSPITAL_COMMUNITY): Admission: RE | Admit: 2013-07-26 | Payer: Medicare Other | Source: Ambulatory Visit | Admitting: Urology

## 2013-07-26 ENCOUNTER — Encounter (HOSPITAL_COMMUNITY): Admission: RE | Payer: Self-pay | Source: Ambulatory Visit

## 2013-07-26 SURGERY — TURP (TRANSURETHRAL RESECTION OF PROSTATE)
Anesthesia: General

## 2013-08-01 ENCOUNTER — Other Ambulatory Visit: Payer: Self-pay | Admitting: Family Medicine

## 2013-08-01 DIAGNOSIS — R0602 Shortness of breath: Secondary | ICD-10-CM

## 2013-08-02 ENCOUNTER — Ambulatory Visit (INDEPENDENT_AMBULATORY_CARE_PROVIDER_SITE_OTHER): Payer: Medicare Other | Admitting: Internal Medicine

## 2013-08-02 DIAGNOSIS — R0602 Shortness of breath: Secondary | ICD-10-CM

## 2013-08-02 LAB — PULMONARY FUNCTION TEST
DL/VA % pred: 96 %
DL/VA: 4.07 ml/min/mmHg/L
DLCO UNC: 15.36 ml/min/mmHg
DLCO unc % pred: 59 %
FEF 25-75 POST: 0.72 L/s
FEF 25-75 Pre: 0.69 L/sec
FEF2575-%Change-Post: 4 %
FEF2575-%PRED-POST: 69 %
FEF2575-%Pred-Pre: 66 %
FEV1-%Change-Post: 0 %
FEV1-%PRED-POST: 69 %
FEV1-%PRED-PRE: 69 %
FEV1-POST: 1.27 L
FEV1-PRE: 1.28 L
FEV1FVC-%CHANGE-POST: 0 %
FEV1FVC-%PRED-PRE: 97 %
FEV6-%CHANGE-POST: 1 %
FEV6-%PRED-PRE: 72 %
FEV6-%Pred-Post: 74 %
FEV6-Post: 1.87 L
FEV6-Pre: 1.84 L
FEV6FVC-%CHANGE-POST: 2 %
FEV6FVC-%Pred-Post: 109 %
FEV6FVC-%Pred-Pre: 107 %
FVC-%CHANGE-POST: 0 %
FVC-%PRED-POST: 67 %
FVC-%Pred-Pre: 67 %
FVC-PRE: 1.88 L
FVC-Post: 1.87 L
PRE FEV1/FVC RATIO: 68 %
Post FEV1/FVC ratio: 68 %
Post FEV6/FVC ratio: 100 %
Pre FEV6/FVC Ratio: 98 %
RV % pred: 102 %
RV: 2.64 L
TLC % pred: 85 %
TLC: 5.21 L

## 2013-08-02 NOTE — Progress Notes (Signed)
PFT done today. 

## 2013-08-14 ENCOUNTER — Encounter: Payer: Self-pay | Admitting: Internal Medicine

## 2013-08-23 NOTE — Telephone Encounter (Signed)
Close Encounter 

## 2013-08-29 ENCOUNTER — Emergency Department (HOSPITAL_COMMUNITY): Payer: Medicare Other

## 2013-08-29 ENCOUNTER — Emergency Department (HOSPITAL_COMMUNITY)
Admission: EM | Admit: 2013-08-29 | Discharge: 2013-08-29 | Disposition: A | Discharge status: Home / Self Care | Payer: Medicare Other | Attending: Emergency Medicine | Admitting: Emergency Medicine

## 2013-08-29 ENCOUNTER — Encounter (HOSPITAL_COMMUNITY): Payer: Self-pay | Admitting: Emergency Medicine

## 2013-08-29 DIAGNOSIS — Z7982 Long term (current) use of aspirin: Secondary | ICD-10-CM | POA: Diagnosis not present

## 2013-08-29 DIAGNOSIS — R0600 Dyspnea, unspecified: Secondary | ICD-10-CM

## 2013-08-29 DIAGNOSIS — I2510 Atherosclerotic heart disease of native coronary artery without angina pectoris: Secondary | ICD-10-CM | POA: Insufficient documentation

## 2013-08-29 DIAGNOSIS — Z87442 Personal history of urinary calculi: Secondary | ICD-10-CM | POA: Diagnosis not present

## 2013-08-29 DIAGNOSIS — Z79899 Other long term (current) drug therapy: Secondary | ICD-10-CM | POA: Diagnosis not present

## 2013-08-29 DIAGNOSIS — Z87891 Personal history of nicotine dependence: Secondary | ICD-10-CM | POA: Insufficient documentation

## 2013-08-29 DIAGNOSIS — E785 Hyperlipidemia, unspecified: Secondary | ICD-10-CM | POA: Diagnosis not present

## 2013-08-29 DIAGNOSIS — N400 Enlarged prostate without lower urinary tract symptoms: Secondary | ICD-10-CM | POA: Diagnosis not present

## 2013-08-29 DIAGNOSIS — Z952 Presence of prosthetic heart valve: Secondary | ICD-10-CM | POA: Insufficient documentation

## 2013-08-29 DIAGNOSIS — R42 Dizziness and giddiness: Secondary | ICD-10-CM | POA: Insufficient documentation

## 2013-08-29 DIAGNOSIS — J441 Chronic obstructive pulmonary disease with (acute) exacerbation: Secondary | ICD-10-CM | POA: Diagnosis not present

## 2013-08-29 DIAGNOSIS — R011 Cardiac murmur, unspecified: Secondary | ICD-10-CM | POA: Diagnosis not present

## 2013-08-29 DIAGNOSIS — F329 Major depressive disorder, single episode, unspecified: Secondary | ICD-10-CM | POA: Diagnosis not present

## 2013-08-29 DIAGNOSIS — Z951 Presence of aortocoronary bypass graft: Secondary | ICD-10-CM | POA: Insufficient documentation

## 2013-08-29 DIAGNOSIS — F3289 Other specified depressive episodes: Secondary | ICD-10-CM | POA: Diagnosis not present

## 2013-08-29 DIAGNOSIS — K219 Gastro-esophageal reflux disease without esophagitis: Secondary | ICD-10-CM | POA: Diagnosis not present

## 2013-08-29 LAB — CBC WITH DIFFERENTIAL/PLATELET
BASOS ABS: 0 10*3/uL (ref 0.0–0.1)
Basophils Relative: 0 % (ref 0–1)
Eosinophils Absolute: 0.1 10*3/uL (ref 0.0–0.7)
Eosinophils Relative: 1 % (ref 0–5)
HCT: 35.3 % — ABNORMAL LOW (ref 39.0–52.0)
Hemoglobin: 11.8 g/dL — ABNORMAL LOW (ref 13.0–17.0)
Lymphocytes Relative: 18 % (ref 12–46)
Lymphs Abs: 1.6 10*3/uL (ref 0.7–4.0)
MCH: 28.6 pg (ref 26.0–34.0)
MCHC: 33.4 g/dL (ref 30.0–36.0)
MCV: 85.5 fL (ref 78.0–100.0)
Monocytes Absolute: 0.7 10*3/uL (ref 0.1–1.0)
Monocytes Relative: 8 % (ref 3–12)
Neutro Abs: 6.3 10*3/uL (ref 1.7–7.7)
Neutrophils Relative %: 73 % (ref 43–77)
PLATELETS: 184 10*3/uL (ref 150–400)
RBC: 4.13 MIL/uL — AB (ref 4.22–5.81)
RDW: 12.6 % (ref 11.5–15.5)
WBC: 8.8 10*3/uL (ref 4.0–10.5)

## 2013-08-29 LAB — COMPREHENSIVE METABOLIC PANEL
ALK PHOS: 58 U/L (ref 39–117)
ALT: 11 U/L (ref 0–53)
AST: 19 U/L (ref 0–37)
Albumin: 4 g/dL (ref 3.5–5.2)
Anion gap: 14 (ref 5–15)
BUN: 16 mg/dL (ref 6–23)
CHLORIDE: 91 meq/L — AB (ref 96–112)
CO2: 26 mEq/L (ref 19–32)
Calcium: 9.3 mg/dL (ref 8.4–10.5)
Creatinine, Ser: 1.18 mg/dL (ref 0.50–1.35)
GFR calc Af Amer: 61 mL/min — ABNORMAL LOW (ref >90)
GFR calc non Af Amer: 53 mL/min — ABNORMAL LOW (ref >90)
Glucose, Bld: 138 mg/dL — ABNORMAL HIGH (ref 70–99)
POTASSIUM: 4 meq/L (ref 3.7–5.3)
Sodium: 131 mEq/L — ABNORMAL LOW (ref 137–147)
Total Bilirubin: 0.4 mg/dL (ref 0.3–1.2)
Total Protein: 6.9 g/dL (ref 6.0–8.3)

## 2013-08-29 LAB — PRO B NATRIURETIC PEPTIDE: Pro B Natriuretic peptide (BNP): 8114 pg/mL — ABNORMAL HIGH (ref 0–450)

## 2013-08-29 LAB — TROPONIN I

## 2013-08-29 LAB — D-DIMER, QUANTITATIVE: D-Dimer, Quant: 0.33 ug/mL-FEU (ref 0.00–0.48)

## 2013-08-29 MED ORDER — ONDANSETRON 4 MG PO TBDP
4.0000 mg | ORAL_TABLET | Freq: Once | ORAL | Status: AC
  Administered 2013-08-29: 4 mg via ORAL
  Filled 2013-08-29: qty 1

## 2013-08-29 MED ORDER — MECLIZINE HCL 25 MG PO TABS
12.5000 mg | ORAL_TABLET | Freq: Once | ORAL | Status: AC
  Administered 2013-08-29: 12.5 mg via ORAL
  Filled 2013-08-29: qty 1

## 2013-08-29 MED ORDER — MECLIZINE HCL 12.5 MG PO TABS: 12.5000 mg | ORAL_TABLET | Freq: Three times a day (TID) | ORAL | Status: DC | PRN

## 2013-08-29 MED ORDER — NITROGLYCERIN 0.4 MG SL SUBL: 0.4000 mg | SUBLINGUAL_TABLET | SUBLINGUAL | Status: DC | PRN

## 2013-08-29 MED ORDER — NITROGLYCERIN 2 % TD OINT
0.5000 [in_us] | TOPICAL_OINTMENT | Freq: Once | TRANSDERMAL | Status: AC
  Administered 2013-08-29: 0.5 [in_us] via TOPICAL
  Filled 2013-08-29: qty 30

## 2013-08-29 NOTE — ED Notes (Signed)
[employer] rep at bedside.  

## 2013-08-29 NOTE — ED Notes (Signed)
Pt escorted to discharge window. Pt verbalized understanding discharge instructions. In no acute distress.  

## 2013-08-29 NOTE — ED Notes (Signed)
Pt states he feels short of breath and dizzy  Pt states he was in here May 31st for the same  Pt states he feels like this off and on  Pt cannot specify what is different this morning  Pt is able to speak in complete sentences  No acute distress noted

## 2013-08-29 NOTE — ED Notes (Addendum)
rn attempting to interrogate pacemaker, in chart pacemaker says [employer] Jude, but pt has card for [employer]. Calling medtronic presently  Pt has medtronic porcine valve, pacemaker through [employer]

## 2013-08-29 NOTE — ED Provider Notes (Signed)
CSN: [id number]     Arrival date & time 08/29/13  4098 History   First MD Initiated Contact with Patient 08/29/13 [phone]     Chief Complaint  Patient presents with  . Shortness of Breath  . Dizziness     (Consider location/radiation/quality/duration/timing/severity/associated sxs/prior Treatment) HPI Pt reports he woke up at 2:30 this morning to drain his foley catheter bag and he was SOB. He denies any chest pain, diaphoresis, nausea, vomiting, pain or swelling in his legs, wheezing, coughing, or fever. He states he has developed some mild nausea while in the ED. He states he has been having episodes of shortness of breath for "quite a spell". He states it has been a few years. He however states he didn't discuss it with his physicians. He states the shortness of breath can occur at any time even at rest. On last a few minutes. He states nothing he does makes the shortness of breath occur or get worse, he states he tries to rest when it happens and usually it goes away. He also reports he has been having some episodes of dizziness that he describes as a spinning sensation. It is usually not associated with shortness of breath however today it has been. He denies any headache. Patient states he is still short of breath at this time.   PCP Dr Barbaraann Barthel Cardiology Dr Myrtis Ser  Past Medical History  Diagnosis Date  . Hyperlipidemia   . LBBB (left bundle branch block)   . Depression   . COPD (chronic obstructive pulmonary disease)   . Calcium oxalate renal stones   . Dizziness   . GERD (gastroesophageal reflux disease)   . Diverticulosis   . IBS (irritable bowel syndrome)   . Leg cramps     not vascular in origin   . Liver cyst   . Wide-complex tachycardia     post op - on a beta blocker   . Sinus congestion   . Bleeding nose     cauteriszed dr Lazarus Salines  / cauterization repeated 10/11  . Coronary artery disease     Nuclear June 03, 2010, scar anterior wall apex and inferior wall, no  definite ischemia,    not gated  . Intolerance of drug     Niaspan  . S/P aortic valve replacement with bioprosthetic valve     echo.[name] April, 2011... good valve function... mild AI  . S/P CABG (coronary artery bypass graft) January, 2007  . LV dysfunction     EF 45% / EF 25-30%, echo, April, 2011 / EF 25-30%, echo, April, 2012, akinesis of the inferior and posterior walls  . Ischemic cardiomyopathy     EF followed carefully  . Carotid artery disease     Doppler, 2011, bilateral 40-59%    /   Doppler, March, 2013, no change, bilateral 40-59%  . BPH (benign prostatic hyperplasia)     2013  . Cough     January, 2014  . Ejection fraction < 50%    Past Surgical History  Procedure Laterality Date  . Cholecystectomy    . Aortic valve replacement  02/2005  . Insert / replace / remove pacemaker      must call rep to interrogate pacemaker [phone], [employer], older model  . Coronary artery bypass graft      x5  -- last one in 07  . Lithotripsy    . Mosaic ultra porcine heart valve      medtronic,  [id number], 03/02/2005, MD clarence Cornelius Moras  [phone], device questions [phone]   History reviewed. No pertinent family history. History  Substance Use Topics  . Smoking status: Former Smoker    Quit date: 02/08/1954  . Smokeless tobacco: Not on file  . Alcohol Use: No  lives at home Lives with spouse  Review of Systems  All other systems reviewed and are negative.     Allergies  Niaspan and Sulfonamide derivatives  Home Medications   Prior to Admission medications   Medication Sig Start Date End Date Taking? Authorizing Provider  aspirin EC 81 MG tablet Take 81 mg by mouth daily.    Historical Provider, MD  AVODART 0.5 MG capsule Take 1 tablet by mouth Daily. 09/28/11   Historical Provider, MD  carvedilol (COREG) 12.5 MG tablet Take 12.5 mg by mouth 2 (two) times daily with a meal.    Historical Provider, MD  docusate sodium (COLACE) 100 MG capsule Take 100 mg by mouth  daily as needed for mild constipation.     Historical Provider, MD  fexofenadine (ALLEGRA) 60 MG tablet Take 1 tablet (60 mg total) by mouth 2 (two) times daily. 07/08/13   Gwyneth Sprout, MD  furosemide (LASIX) 40 MG tablet Take 60 mg by mouth daily. Take one and a half tablets by mouth daily.    Historical Provider, MD  Multiple Vitamin (MULTIVITAMIN) tablet Take 1 tablet by mouth daily.      Historical Provider, MD  nitroGLYCERIN (NITROSTAT) 0.4 MG SL tablet Place 1 tablet (0.4 mg total) under the tongue every 5 (five) minutes as needed for chest pain. 11/28/11   Richarda Overlie, MD  omeprazole (PRILOSEC) 20 MG capsule Take 1 capsule (20 mg total) by mouth daily. 01/26/13 01/26/14  Mardella Layman, MD  potassium chloride (K-DUR,KLOR-CON) 10 MEQ tablet Take 10 mEq by mouth once.     Historical Provider, MD  ramipril (ALTACE) 10 MG capsule Take 10 mg by mouth at bedtime.    Historical Provider, MD  simvastatin (ZOCOR) 40 MG tablet Take 1 tablet (40 mg total) by mouth at bedtime. 04/11/13   [name], MD  tamsulosin (FLOMAX) 0.4 MG CAPS capsule Take 2 capsules by mouth daily. 02/07/13   Historical Provider, MD  Wheat Dextrin (BENEFIBER) POWD Take 1 scoop by mouth daily.     Historical Provider, MD  Zinc 50 MG TABS Take 50 mg by mouth daily.      Historical Provider, MD   BP 129/53  Pulse 61  Temp(Src) 98.3 F (36.8 C) (Oral)  Resp 15  SpO2 98%  Vital signs normal   Physical Exam  Nursing note and vitals reviewed. Constitutional: He is oriented to person, place, and time.  Non-toxic appearance. He does not appear ill. No distress.  Frail elderly male  HENT:  Head: Normocephalic and atraumatic.  Right Ear: External ear normal.  Left Ear: External ear normal.  Nose: Nose normal. No mucosal edema or rhinorrhea.  Mouth/Throat: Oropharynx is clear and moist and mucous membranes are normal. No dental abscesses or uvula swelling.  Eyes: Conjunctivae and EOM are normal. Pupils are equal,  round, and reactive to light.  Neck: Normal range of motion and full passive range of motion without pain. Neck supple.  Cardiovascular: Normal rate and regular rhythm.  Exam reveals no gallop and no friction rub.   Murmur heard. Patient has a faint high-pitched slightly blowing murmur heard best in the right upper sternal border  Pulmonary/Chest: Effort normal and breath sounds normal. No respiratory distress. He has  no wheezes. He has no rhonchi. He has no rales. He exhibits no tenderness and no crepitus.  Abdominal: Soft. Normal appearance and bowel sounds are normal. He exhibits no distension. There is no tenderness. There is no rebound and no guarding.  Genitourinary:  Foley catheter in place  Musculoskeletal: Normal range of motion. He exhibits no edema and no tenderness.  Moves all extremities well.   Neurological: He is alert and oriented to person, place, and time. He has normal strength. No cranial nerve deficit.  Skin: Skin is warm, dry and intact. No rash noted. No erythema. No pallor.  Psychiatric: He has a normal mood and affect. His speech is normal and behavior is normal. His mood appears not anxious.    ED Course  Procedures (including critical care time)  Medications  nitroGLYCERIN (NITROGLYN) 2 % ointment 0.5 inch (0.5 inches Topical Given 08/29/13 0744)  meclizine (ANTIVERT) tablet 12.5 mg (12.5 mg Oral Given 08/29/13 0742)  ondansetron (ZOFRAN-ODT) disintegrating tablet 4 mg (4 mg Oral Given 08/29/13 0742)   07:55 Review of patients chart shows he has a [employer] pacemaker.   Pt ambulated by nursing staff and his pulse ox remained 96% and above. He however had a brief episode of dizziness when he sat back down on his stretcher.   10:00 Pt's pacemaker has been interrogated. Waiting for results.   12:18 Pacemaker has been interrogated by [employer] representative who states it is functioning properly.      Labs Review Results for orders placed during the hospital  encounter of 08/29/13  CBC WITH DIFFERENTIAL      Result Value Ref Range   WBC 8.8  4.0 - 10.5 K/uL   RBC 4.13 (*) 4.22 - 5.81 MIL/uL   Hemoglobin 11.8 (*) 13.0 - 17.0 g/dL   HCT 16.135.3 (*) 09.639.0 - 04.552.0 %   MCV 85.5  78.0 - 100.0 fL   MCH 28.6  26.0 - 34.0 pg   MCHC 33.4  30.0 - 36.0 g/dL   RDW 40.912.6  81.111.5 - 91.415.5 %   Platelets 184  150 - 400 K/uL   Neutrophils Relative % 73  43 - 77 %   Neutro Abs 6.3  1.7 - 7.7 K/uL   Lymphocytes Relative 18  12 - 46 %   Lymphs Abs 1.6  0.7 - 4.0 K/uL   Monocytes Relative 8  3 - 12 %   Monocytes Absolute 0.7  0.1 - 1.0 K/uL   Eosinophils Relative 1  0 - 5 %   Eosinophils Absolute 0.1  0.0 - 0.7 K/uL   Basophils Relative 0  0 - 1 %   Basophils Absolute 0.0  0.0 - 0.1 K/uL  COMPREHENSIVE METABOLIC PANEL      Result Value Ref Range   Sodium 131 (*) 137 - 147 mEq/L   Potassium 4.0  3.7 - 5.3 mEq/L   Chloride 91 (*) 96 - 112 mEq/L   CO2 26  19 - 32 mEq/L   Glucose, Bld 138 (*) 70 - 99 mg/dL   BUN 16  6 - 23 mg/dL   Creatinine, Ser 7.821.18  0.50 - 1.35 mg/dL   Calcium 9.3  8.4 - 95.610.5 mg/dL   Total Protein 6.9  6.0 - 8.3 g/dL   Albumin 4.0  3.5 - 5.2 g/dL   AST 19  0 - 37 U/L   ALT 11  0 - 53 U/L   Alkaline Phosphatase 58  39 - 117 U/L   Total  Bilirubin 0.4  0.3 - 1.2 mg/dL   GFR calc non Af Amer 53 (*) >90 mL/min   GFR calc Af Amer 61 (*) >90 mL/min   Anion gap 14  5 - 15  PRO B NATRIURETIC PEPTIDE      Result Value Ref Range   Pro B Natriuretic peptide (BNP) 8114.0 (*) 0 - 450 pg/mL  TROPONIN I      Result Value Ref Range   Troponin I <0.30  <0.30 ng/mL  D-DIMER, QUANTITATIVE      Result Value Ref Range   D-Dimer, Quant 0.33  0.00 - 0.48 ug/mL-FEU   Laboratory interpretation all normal except stable hyponatremia, stable low chloride, stable elevation of BNP .    Imaging Review Dg Chest 2 View  08/29/2013   CLINICAL DATA:  Cough, short of breath  EXAM: CHEST  2 VIEW  COMPARISON:  Prior chest x-ray 07/08/2013  FINDINGS: Stable position of  left subclavian approach cardiac rhythm maintenance device. Leads project over the right atrium and right ventricle. Cardiac and mediastinal contours are unchanged. Status post median sternotomy with evidence of multivessel CABG and aortic valve replacement. Atherosclerotic calcifications present in the transverse aorta. Patchy and linear densities overlying both hemithoraces consistent with calcified pleural plaques. Decreased blunting of the right costophrenic angle compared to prior likely consistent with resolved pleural effusion. No focal airspace consolidation, pulmonary edema or evidence of pneumothorax. No suspicious pulmonary nodule or mass. Osseous structures are intact and unremarkable for age.  IMPRESSION: 1. Stable chest x-ray without evidence of active cardiopulmonary disease. 2. Similar appearance of bilateral calcified pleural plaques.   Electronically Signed   By: Malachy Moan M.D.   On: 08/29/2013 07:44     EKG Interpretation   Date/Time:  Wednesday August 29 2013 06:45:05 EDT Ventricular Rate:  72 PR Interval:  183 QRS Duration: 210 QT Interval:  515 QTC Calculation: 564 R Axis:   -124 Text Interpretation:  Ventricular-paced rhythm No significant change since  last tracing 08 Jul 2013 Confirmed by Cali Hope  MD-I, Demorio Seeley ([id number]) on  08/29/2013 7:13:52 AM      MDM   Final diagnoses:  Dizziness  Dyspnea    New Prescriptions   MECLIZINE (ANTIVERT) 12.5 MG TABLET    Take 1 tablet (12.5 mg total) by mouth 3 (three) times daily as needed for dizziness.   NITROGLYCERIN (NITROSTAT) 0.4 MG SL TABLET    Place 1 tablet (0.4 mg total) under the tongue every 5 (five) minutes as needed for chest pain.    Plan discharge  Devoria Albe, MD, Franz Dell, MD 08/29/13 1225

## 2013-08-29 NOTE — ED Notes (Signed)
Pt states he feels dizzy after walking

## 2013-08-29 NOTE — ED Notes (Signed)
rn called [employer], discovered pt has older version of pacemaker which is not compatible with ED Engineer, technical salest Jude interrogator. Called Rep to come interrogate pacemaker.

## 2013-08-29 NOTE — ED Notes (Signed)
md at bedside

## 2013-08-29 NOTE — Discharge Instructions (Signed)
Take the meclizine tablets as needed for dizziness. Try the nitroglycerin for your shortness of breath. Sit down when you take the pill. Call Dr Henrietta HooverKatz's office to be rechecked soon. Return to the ED if you feel you are getting worse instead of better.   Vertigo Vertigo means you feel like you are moving when you are not. Vertigo can make you feel like things around you are moving when they are not. This problem often goes away on its own.  HOME CARE   Follow your doctor's instructions.  Avoid driving.  Avoid using heavy machinery.  Avoid doing any activity that could be dangerous if you have a vertigo attack.  Tell your doctor if a medicine seems to cause your vertigo. GET HELP RIGHT AWAY IF:   Your medicines do not help or make you feel worse.  You have trouble talking or walking.  You feel weak or have trouble using your arms, hands, or legs.  You have bad headaches.  You keep feeling sick to your stomach (nauseous) or throwing up (vomiting).  Your vision changes.  A family member notices changes in your behavior.  Your problems get worse. MAKE SURE YOU:  Understand these instructions.  Will watch your condition.  Will get help right away if you are not doing well or get worse. Document Released: 11/04/2007 Document Revised: 04/19/2011 Document Reviewed: 08/13/2010 [facility] Patient Information 2015 [employer] [place]. This information is not intended to replace advice given to you by your health care provider. Make sure you discuss any questions you have with your health care provider.

## 2013-09-05 ENCOUNTER — Ambulatory Visit: Payer: Medicare Other | Admitting: Cardiology

## 2013-09-10 ENCOUNTER — Ambulatory Visit (HOSPITAL_COMMUNITY): Payer: Medicare Other | Attending: Cardiology | Admitting: Radiology

## 2013-09-10 DIAGNOSIS — J449 Chronic obstructive pulmonary disease, unspecified: Secondary | ICD-10-CM | POA: Diagnosis not present

## 2013-09-10 DIAGNOSIS — I359 Nonrheumatic aortic valve disorder, unspecified: Secondary | ICD-10-CM | POA: Diagnosis not present

## 2013-09-10 DIAGNOSIS — Z954 Presence of other heart-valve replacement: Secondary | ICD-10-CM

## 2013-09-10 DIAGNOSIS — I129 Hypertensive chronic kidney disease with stage 1 through stage 4 chronic kidney disease, or unspecified chronic kidney disease: Secondary | ICD-10-CM | POA: Diagnosis not present

## 2013-09-10 DIAGNOSIS — Z87891 Personal history of nicotine dependence: Secondary | ICD-10-CM | POA: Insufficient documentation

## 2013-09-10 DIAGNOSIS — I2510 Atherosclerotic heart disease of native coronary artery without angina pectoris: Secondary | ICD-10-CM | POA: Diagnosis not present

## 2013-09-10 DIAGNOSIS — I447 Left bundle-branch block, unspecified: Secondary | ICD-10-CM | POA: Diagnosis not present

## 2013-09-10 DIAGNOSIS — J4489 Other specified chronic obstructive pulmonary disease: Secondary | ICD-10-CM | POA: Insufficient documentation

## 2013-09-10 DIAGNOSIS — E785 Hyperlipidemia, unspecified: Secondary | ICD-10-CM | POA: Insufficient documentation

## 2013-09-10 DIAGNOSIS — N189 Chronic kidney disease, unspecified: Secondary | ICD-10-CM | POA: Insufficient documentation

## 2013-09-10 DIAGNOSIS — I5022 Chronic systolic (congestive) heart failure: Secondary | ICD-10-CM | POA: Insufficient documentation

## 2013-09-10 DIAGNOSIS — I2589 Other forms of chronic ischemic heart disease: Secondary | ICD-10-CM | POA: Insufficient documentation

## 2013-09-10 NOTE — Progress Notes (Signed)
Echocardiogram performed.  

## 2013-09-11 ENCOUNTER — Encounter: Payer: Self-pay | Admitting: Cardiology

## 2013-09-13 ENCOUNTER — Emergency Department (HOSPITAL_COMMUNITY): Payer: Medicare Other

## 2013-09-13 ENCOUNTER — Inpatient Hospital Stay (HOSPITAL_COMMUNITY)
Admission: EM | Admit: 2013-09-13 | Discharge: 2013-09-18 | DRG: 101 | Disposition: A | Discharge status: Home / Self Care | Payer: Medicare Other | Attending: Internal Medicine | Admitting: Internal Medicine

## 2013-09-13 ENCOUNTER — Other Ambulatory Visit: Payer: Self-pay

## 2013-09-13 ENCOUNTER — Encounter (HOSPITAL_COMMUNITY): Payer: Self-pay | Admitting: Radiology

## 2013-09-13 DIAGNOSIS — F3289 Other specified depressive episodes: Secondary | ICD-10-CM | POA: Diagnosis present

## 2013-09-13 DIAGNOSIS — R569 Unspecified convulsions: Secondary | ICD-10-CM

## 2013-09-13 DIAGNOSIS — I5022 Chronic systolic (congestive) heart failure: Secondary | ICD-10-CM | POA: Diagnosis present

## 2013-09-13 DIAGNOSIS — R05 Cough: Secondary | ICD-10-CM

## 2013-09-13 DIAGNOSIS — Z952 Presence of prosthetic heart valve: Secondary | ICD-10-CM | POA: Diagnosis not present

## 2013-09-13 DIAGNOSIS — K219 Gastro-esophageal reflux disease without esophagitis: Secondary | ICD-10-CM | POA: Diagnosis present

## 2013-09-13 DIAGNOSIS — N401 Enlarged prostate with lower urinary tract symptoms: Secondary | ICD-10-CM | POA: Diagnosis present

## 2013-09-13 DIAGNOSIS — Z66 Do not resuscitate: Secondary | ICD-10-CM | POA: Diagnosis not present

## 2013-09-13 DIAGNOSIS — Z953 Presence of xenogenic heart valve: Secondary | ICD-10-CM

## 2013-09-13 DIAGNOSIS — N400 Enlarged prostate without lower urinary tract symptoms: Secondary | ICD-10-CM

## 2013-09-13 DIAGNOSIS — N138 Other obstructive and reflux uropathy: Secondary | ICD-10-CM | POA: Diagnosis present

## 2013-09-13 DIAGNOSIS — Z87891 Personal history of nicotine dependence: Secondary | ICD-10-CM

## 2013-09-13 DIAGNOSIS — Z950 Presence of cardiac pacemaker: Secondary | ICD-10-CM | POA: Diagnosis not present

## 2013-09-13 DIAGNOSIS — R4182 Altered mental status, unspecified: Secondary | ICD-10-CM | POA: Diagnosis not present

## 2013-09-13 DIAGNOSIS — R58 Hemorrhage, not elsewhere classified: Secondary | ICD-10-CM

## 2013-09-13 DIAGNOSIS — K5730 Diverticulosis of large intestine without perforation or abscess without bleeding: Secondary | ICD-10-CM

## 2013-09-13 DIAGNOSIS — J438 Other emphysema: Secondary | ICD-10-CM | POA: Diagnosis present

## 2013-09-13 DIAGNOSIS — Z954 Presence of other heart-valve replacement: Secondary | ICD-10-CM

## 2013-09-13 DIAGNOSIS — E871 Hypo-osmolality and hyponatremia: Secondary | ICD-10-CM

## 2013-09-13 DIAGNOSIS — F329 Major depressive disorder, single episode, unspecified: Secondary | ICD-10-CM | POA: Diagnosis present

## 2013-09-13 DIAGNOSIS — IMO0002 Reserved for concepts with insufficient information to code with codable children: Secondary | ICD-10-CM | POA: Diagnosis not present

## 2013-09-13 DIAGNOSIS — G8389 Other specified paralytic syndromes: Secondary | ICD-10-CM | POA: Diagnosis present

## 2013-09-13 DIAGNOSIS — I255 Ischemic cardiomyopathy: Secondary | ICD-10-CM

## 2013-09-13 DIAGNOSIS — G9340 Encephalopathy, unspecified: Secondary | ICD-10-CM | POA: Diagnosis present

## 2013-09-13 DIAGNOSIS — I2510 Atherosclerotic heart disease of native coronary artery without angina pectoris: Secondary | ICD-10-CM | POA: Diagnosis present

## 2013-09-13 DIAGNOSIS — N183 Chronic kidney disease, stage 3 unspecified: Secondary | ICD-10-CM

## 2013-09-13 DIAGNOSIS — I10 Essential (primary) hypertension: Secondary | ICD-10-CM | POA: Diagnosis present

## 2013-09-13 DIAGNOSIS — R059 Cough, unspecified: Secondary | ICD-10-CM

## 2013-09-13 DIAGNOSIS — J449 Chronic obstructive pulmonary disease, unspecified: Secondary | ICD-10-CM

## 2013-09-13 DIAGNOSIS — E785 Hyperlipidemia, unspecified: Secondary | ICD-10-CM | POA: Diagnosis present

## 2013-09-13 DIAGNOSIS — I447 Left bundle-branch block, unspecified: Secondary | ICD-10-CM

## 2013-09-13 DIAGNOSIS — Z951 Presence of aortocoronary bypass graft: Secondary | ICD-10-CM | POA: Diagnosis not present

## 2013-09-13 DIAGNOSIS — R0609 Other forms of dyspnea: Secondary | ICD-10-CM

## 2013-09-13 DIAGNOSIS — I2589 Other forms of chronic ischemic heart disease: Secondary | ICD-10-CM | POA: Diagnosis present

## 2013-09-13 DIAGNOSIS — I509 Heart failure, unspecified: Secondary | ICD-10-CM | POA: Diagnosis present

## 2013-09-13 DIAGNOSIS — R338 Other retention of urine: Secondary | ICD-10-CM

## 2013-09-13 DIAGNOSIS — Z7982 Long term (current) use of aspirin: Secondary | ICD-10-CM | POA: Diagnosis not present

## 2013-09-13 DIAGNOSIS — R339 Retention of urine, unspecified: Secondary | ICD-10-CM

## 2013-09-13 DIAGNOSIS — R9430 Abnormal result of cardiovascular function study, unspecified: Secondary | ICD-10-CM

## 2013-09-13 LAB — URINALYSIS, ROUTINE W REFLEX MICROSCOPIC
BILIRUBIN URINE: NEGATIVE
Glucose, UA: NEGATIVE mg/dL
KETONES UR: NEGATIVE mg/dL
Nitrite: NEGATIVE
PROTEIN: NEGATIVE mg/dL
Specific Gravity, Urine: 1.008 (ref 1.005–1.030)
UROBILINOGEN UA: 0.2 mg/dL (ref 0.0–1.0)
pH: 6 (ref 5.0–8.0)

## 2013-09-13 LAB — CBC
HCT: 37.9 % — ABNORMAL LOW (ref 39.0–52.0)
Hemoglobin: 12.4 g/dL — ABNORMAL LOW (ref 13.0–17.0)
MCH: 28.8 pg (ref 26.0–34.0)
MCHC: 32.7 g/dL (ref 30.0–36.0)
MCV: 87.9 fL (ref 78.0–100.0)
Platelets: 187 10*3/uL (ref 150–400)
RBC: 4.31 MIL/uL (ref 4.22–5.81)
RDW: 13 % (ref 11.5–15.5)
WBC: 8.3 10*3/uL (ref 4.0–10.5)

## 2013-09-13 LAB — I-STAT TROPONIN, ED: TROPONIN I, POC: 0.01 ng/mL (ref 0.00–0.08)

## 2013-09-13 LAB — DIFFERENTIAL
Basophils Absolute: 0.1 10*3/uL (ref 0.0–0.1)
Basophils Relative: 1 % (ref 0–1)
EOS ABS: 0.2 10*3/uL (ref 0.0–0.7)
Eosinophils Relative: 3 % (ref 0–5)
LYMPHS ABS: 1.9 10*3/uL (ref 0.7–4.0)
Lymphocytes Relative: 23 % (ref 12–46)
MONO ABS: 0.9 10*3/uL (ref 0.1–1.0)
MONOS PCT: 11 % (ref 3–12)
NEUTROS PCT: 62 % (ref 43–77)
Neutro Abs: 5.2 10*3/uL (ref 1.7–7.7)

## 2013-09-13 LAB — COMPREHENSIVE METABOLIC PANEL
ALT: 10 U/L (ref 0–53)
ANION GAP: 13 (ref 5–15)
AST: 18 U/L (ref 0–37)
Albumin: 4.2 g/dL (ref 3.5–5.2)
Alkaline Phosphatase: 60 U/L (ref 39–117)
BILIRUBIN TOTAL: 0.3 mg/dL (ref 0.3–1.2)
BUN: 21 mg/dL (ref 6–23)
CO2: 26 mEq/L (ref 19–32)
CREATININE: 1.27 mg/dL (ref 0.50–1.35)
Calcium: 9 mg/dL (ref 8.4–10.5)
Chloride: 95 mEq/L — ABNORMAL LOW (ref 96–112)
GFR calc non Af Amer: 48 mL/min — ABNORMAL LOW (ref >90)
GFR, EST AFRICAN AMERICAN: 56 mL/min — AB (ref >90)
GLUCOSE: 98 mg/dL (ref 70–99)
POTASSIUM: 4.2 meq/L (ref 3.7–5.3)
Sodium: 134 mEq/L — ABNORMAL LOW (ref 137–147)
TOTAL PROTEIN: 7.3 g/dL (ref 6.0–8.3)

## 2013-09-13 LAB — URINE MICROSCOPIC-ADD ON

## 2013-09-13 LAB — I-STAT CHEM 8, ED
BUN: 23 mg/dL (ref 6–23)
Calcium, Ion: 1.13 mmol/L (ref 1.13–1.30)
Chloride: 97 mEq/L (ref 96–112)
Creatinine, Ser: 1.4 mg/dL — ABNORMAL HIGH (ref 0.50–1.35)
GLUCOSE: 99 mg/dL (ref 70–99)
HCT: 39 % (ref 39.0–52.0)
HEMOGLOBIN: 13.3 g/dL (ref 13.0–17.0)
Potassium: 4.1 mEq/L (ref 3.7–5.3)
Sodium: 134 mEq/L — ABNORMAL LOW (ref 137–147)
TCO2: 26 mmol/L (ref 0–100)

## 2013-09-13 LAB — APTT: aPTT: 29 seconds (ref 24–37)

## 2013-09-13 LAB — PROTIME-INR
INR: 1.11 (ref 0.00–1.49)
Prothrombin Time: 14.3 seconds (ref 11.6–15.2)

## 2013-09-13 LAB — CBG MONITORING, ED: Glucose-Capillary: 103 mg/dL — ABNORMAL HIGH (ref 70–99)

## 2013-09-13 MED ORDER — PANTOPRAZOLE SODIUM 40 MG PO TBEC
40.0000 mg | DELAYED_RELEASE_TABLET | Freq: Every day | ORAL | Status: DC
  Administered 2013-09-14 – 2013-09-18 (×5): 40 mg via ORAL
  Filled 2013-09-13 (×5): qty 1

## 2013-09-13 MED ORDER — ADULT MULTIVITAMIN W/MINERALS CH
1.0000 | ORAL_TABLET | Freq: Every day | ORAL | Status: DC
  Administered 2013-09-14 – 2013-09-18 (×5): 1 via ORAL
  Filled 2013-09-13 (×5): qty 1

## 2013-09-13 MED ORDER — SODIUM CHLORIDE 0.9 % IV SOLN: 250.0000 mL | INTRAVENOUS | Status: DC | PRN

## 2013-09-13 MED ORDER — SODIUM CHLORIDE 0.9 % IJ SOLN
3.0000 mL | Freq: Two times a day (BID) | INTRAMUSCULAR | Status: DC
  Administered 2013-09-13 – 2013-09-14 (×2): 3 mL via INTRAVENOUS

## 2013-09-13 MED ORDER — POTASSIUM CHLORIDE CRYS ER 10 MEQ PO TBCR
10.0000 meq | EXTENDED_RELEASE_TABLET | Freq: Every day | ORAL | Status: DC
  Administered 2013-09-14 – 2013-09-18 (×5): 10 meq via ORAL
  Filled 2013-09-13 (×5): qty 1

## 2013-09-13 MED ORDER — LORATADINE 10 MG PO TABS
10.0000 mg | ORAL_TABLET | Freq: Every day | ORAL | Status: DC
  Administered 2013-09-14 – 2013-09-18 (×5): 10 mg via ORAL
  Filled 2013-09-13 (×5): qty 1

## 2013-09-13 MED ORDER — LEVETIRACETAM IN NACL 500 MG/100ML IV SOLN
500.0000 mg | Freq: Two times a day (BID) | INTRAVENOUS | Status: DC
  Administered 2013-09-14 – 2013-09-15 (×2): 500 mg via INTRAVENOUS
  Filled 2013-09-13 (×4): qty 100

## 2013-09-13 MED ORDER — CARVEDILOL 12.5 MG PO TABS
12.5000 mg | ORAL_TABLET | Freq: Two times a day (BID) | ORAL | Status: DC
  Administered 2013-09-14 – 2013-09-16 (×4): 12.5 mg via ORAL
  Filled 2013-09-13 (×9): qty 1

## 2013-09-13 MED ORDER — LORAZEPAM 2 MG/ML IJ SOLN
INTRAMUSCULAR | Status: AC
  Filled 2013-09-13: qty 1

## 2013-09-13 MED ORDER — RAMIPRIL 5 MG PO CAPS
10.0000 mg | ORAL_CAPSULE | Freq: Every day | ORAL | Status: DC
  Administered 2013-09-14 – 2013-09-17 (×4): 10 mg via ORAL
  Filled 2013-09-13: qty 1
  Filled 2013-09-13: qty 2
  Filled 2013-09-13: qty 1
  Filled 2013-09-13: qty 2
  Filled 2013-09-13: qty 1
  Filled 2013-09-13: qty 2

## 2013-09-13 MED ORDER — FUROSEMIDE 40 MG PO TABS
60.0000 mg | ORAL_TABLET | Freq: Every day | ORAL | Status: DC
  Administered 2013-09-14 – 2013-09-18 (×5): 60 mg via ORAL
  Filled 2013-09-13 (×8): qty 1

## 2013-09-13 MED ORDER — DUTASTERIDE 0.5 MG PO CAPS
0.5000 mg | ORAL_CAPSULE | Freq: Every evening | ORAL | Status: DC
  Administered 2013-09-15 – 2013-09-17 (×3): 0.5 mg via ORAL
  Filled 2013-09-13 (×5): qty 1

## 2013-09-13 MED ORDER — NITROGLYCERIN 0.4 MG SL SUBL: 0.4000 mg | SUBLINGUAL_TABLET | SUBLINGUAL | Status: DC | PRN

## 2013-09-13 MED ORDER — SIMVASTATIN 40 MG PO TABS
40.0000 mg | ORAL_TABLET | Freq: Every day | ORAL | Status: DC
  Administered 2013-09-14 – 2013-09-17 (×4): 40 mg via ORAL
  Filled 2013-09-13 (×6): qty 1

## 2013-09-13 MED ORDER — ONDANSETRON HCL 4 MG/2ML IJ SOLN: 4.0000 mg | Freq: Four times a day (QID) | INTRAMUSCULAR | Status: DC | PRN

## 2013-09-13 MED ORDER — LORAZEPAM 2 MG/ML IJ SOLN: 2.0000 mg | Freq: Once | INTRAMUSCULAR | Status: DC

## 2013-09-13 MED ORDER — ASPIRIN EC 81 MG PO TBEC
81.0000 mg | DELAYED_RELEASE_TABLET | Freq: Every day | ORAL | Status: DC
  Administered 2013-09-14 – 2013-09-17 (×4): 81 mg via ORAL
  Filled 2013-09-13 (×6): qty 1

## 2013-09-13 MED ORDER — DOCUSATE SODIUM 100 MG PO CAPS
100.0000 mg | ORAL_CAPSULE | Freq: Every evening | ORAL | Status: DC | PRN
  Administered 2013-09-16: 100 mg via ORAL
  Filled 2013-09-13: qty 1

## 2013-09-13 MED ORDER — ONE-DAILY MULTI VITAMINS PO TABS: 1.0000 | ORAL_TABLET | Freq: Every day | ORAL | Status: DC

## 2013-09-13 MED ORDER — LEVETIRACETAM 500 MG PO TABS
500.0000 mg | ORAL_TABLET | Freq: Two times a day (BID) | ORAL | Status: DC
  Filled 2013-09-13: qty 1

## 2013-09-13 MED ORDER — STROKE: EARLY STAGES OF RECOVERY BOOK
Freq: Once | Status: AC
  Administered 2013-09-13: 1
  Filled 2013-09-13: qty 1

## 2013-09-13 MED ORDER — LEVETIRACETAM IN NACL 1000 MG/100ML IV SOLN
1000.0000 mg | INTRAVENOUS | Status: AC
  Administered 2013-09-13: 1000 mg via INTRAVENOUS
  Filled 2013-09-13: qty 100

## 2013-09-13 MED ORDER — LORAZEPAM 2 MG/ML IJ SOLN
1.0000 mg | Freq: Once | INTRAMUSCULAR | Status: AC
  Administered 2013-09-13: 1 mg via INTRAVENOUS

## 2013-09-13 MED ORDER — ONDANSETRON HCL 4 MG PO TABS: 4.0000 mg | ORAL_TABLET | Freq: Four times a day (QID) | ORAL | Status: DC | PRN

## 2013-09-13 MED ORDER — GUAIFENESIN-DM 100-10 MG/5ML PO SYRP: 5.0000 mL | ORAL_SOLUTION | ORAL | Status: DC | PRN

## 2013-09-13 MED ORDER — SODIUM CHLORIDE 0.9 % IJ SOLN: 3.0000 mL | INTRAMUSCULAR | Status: DC | PRN

## 2013-09-13 MED ORDER — HEPARIN SODIUM (PORCINE) 5000 UNIT/ML IJ SOLN
5000.0000 [IU] | Freq: Three times a day (TID) | INTRAMUSCULAR | Status: DC
  Administered 2013-09-14 – 2013-09-18 (×13): 5000 [IU] via SUBCUTANEOUS
  Filled 2013-09-13 (×18): qty 1

## 2013-09-13 MED ORDER — TAMSULOSIN HCL 0.4 MG PO CAPS
0.8000 mg | ORAL_CAPSULE | Freq: Every day | ORAL | Status: DC
  Administered 2013-09-14 – 2013-09-17 (×4): 0.8 mg via ORAL
  Filled 2013-09-13 (×6): qty 2

## 2013-09-13 MED ORDER — ALBUTEROL SULFATE (2.5 MG/3ML) 0.083% IN NEBU
2.5000 mg | INHALATION_SOLUTION | RESPIRATORY_TRACT | Status: DC | PRN
Start: 2013-09-13 — End: 2013-09-18

## 2013-09-13 MED ORDER — SODIUM CHLORIDE 0.9 % IJ SOLN
3.0000 mL | Freq: Two times a day (BID) | INTRAMUSCULAR | Status: DC
  Administered 2013-09-14 – 2013-09-15 (×3): 3 mL via INTRAVENOUS

## 2013-09-13 NOTE — H&P (Signed)
PATIENT DETAILS Name: Bill Cunningham Age: 80 y.o. Sex: male Date of Birth: 10-13-1921 Admit Date: 09/13/2013 ZOX:[id number], MD   CHIEF COMPLAINT:  Confusion  HPI: Bill Cunningham is a 80 y.o. male with a Past Medical History of chronic systolic heart failure, coronary artery disease, status post aortic replacement by prosthetic valve, permanent pacemaker implantation who presents today with the above noted complaint. Please note, patient still is somewhat confused and encephalopathic, most of this history is obtained from the patient's spouse and daughter. Approximately around noon today, patient went to answer the phone, spouse noted that the patient was just standing there with the form in his right hand and was somewhat confused. She also noticed some mild drooling and left lip twitching. She claims that the side of the face was twitching as well. He however never lost consciousness, he was able to follow some commands, she made him sit on the chair and called 911. Apparently EMS reported episodes of forced gaze deviation with episodes of confusion. He was subsequently evaluated in the emergency room, neurology consultation was obtained, he was noted to have a left facial droop, and mild left-sided weakness and neglect. CT of the head done in the emergency room was negative for acute abnormalities, patient was suspected to have had a acute CVA versus seizure with Todd's paresis. I was subsequently asked to admit this patient for further evaluation and treatment. Her spouse, no recent fever, headache, chest pain. Patient has chronic exertional dyspnea from systolic CHF. He does not have any abdominal pain, nausea, vomiting diarrhea. No leg edema.  ALLERGIES:   Allergies  Allergen Reactions  . Niaspan [Niacin Er] Other (See Comments)    Unknown reaction  . Sulfonamide Derivatives Other (See Comments)    Unknown reaction    PAST MEDICAL HISTORY: Past Medical History  Diagnosis  Date  . Hyperlipidemia   . LBBB (left bundle branch block)   . Depression   . COPD (chronic obstructive pulmonary disease)   . Calcium oxalate renal stones   . Dizziness   . GERD (gastroesophageal reflux disease)   . Diverticulosis   . IBS (irritable bowel syndrome)   . Leg cramps     not vascular in origin   . Liver cyst   . Wide-complex tachycardia     post op - on a beta blocker   . Sinus congestion   . Bleeding nose     cauteriszed dr Lazarus Salines  / cauterization repeated 10/11  . Coronary artery disease     Nuclear June 03, 2010, scar anterior wall apex and inferior wall, no definite ischemia,    not gated  . Intolerance of drug     Niaspan  . S/P aortic valve replacement with bioprosthetic valve     echo.[name] April, 2011... good valve function... mild AI  . S/P CABG (coronary artery bypass graft) January, 2007  . LV dysfunction     EF 45% / EF 25-30%, echo, April, 2011 / EF 25-30%, echo, April, 2012, akinesis of the inferior and posterior walls  . Ischemic cardiomyopathy     EF followed carefully  . Carotid artery disease     Doppler, 2011, bilateral 40-59%    /   Doppler, March, 2013, no change, bilateral 40-59%  . BPH (benign prostatic hyperplasia)     2013  . Cough     January, 2014  . Ejection fraction < 50%     PAST SURGICAL HISTORY: Past Surgical  History  Procedure Laterality Date  . Cholecystectomy    . Aortic valve replacement  02/2005  . Insert / replace / remove pacemaker      must call rep to interrogate pacemaker [phone], [employer], older model  . Coronary artery bypass graft      x5  -- last one in 07  . Lithotripsy    . Mosaic ultra porcine heart valve      medtronic,  [id number], 03/02/2005, MD clarence Cornelius Moras [phone], device questions [phone]    MEDICATIONS AT HOME: Prior to Admission medications   Medication Sig Start Date End Date Taking? Authorizing Provider  aspirin EC 81 MG tablet Take 81 mg by mouth at bedtime.     Historical  Provider, MD  AVODART 0.5 MG capsule Take 1 tablet by mouth every evening.  09/28/11   Historical Provider, MD  carvedilol (COREG) 12.5 MG tablet Take 12.5 mg by mouth 2 (two) times daily with a meal.    Historical Provider, MD  docusate sodium (COLACE) 100 MG capsule Take 100 mg by mouth at bedtime as needed for mild constipation.     Historical Provider, MD  fexofenadine (ALLEGRA) 60 MG tablet Take 1 tablet (60 mg total) by mouth 2 (two) times daily. 07/08/13   Gwyneth Sprout, MD  furosemide (LASIX) 40 MG tablet Take 60 mg by mouth daily. Take one and a half tablets by mouth daily.    Historical Provider, MD  meclizine (ANTIVERT) 12.5 MG tablet Take 1 tablet (12.5 mg total) by mouth 3 (three) times daily as needed for dizziness. 08/29/13   Ward Givens, MD  Multiple Vitamin (MULTIVITAMIN) tablet Take 1 tablet by mouth daily.      Historical Provider, MD  nitroGLYCERIN (NITROSTAT) 0.4 MG SL tablet Place 1 tablet (0.4 mg total) under the tongue every 5 (five) minutes as needed for chest pain. 11/28/11   Richarda Overlie, MD  nitroGLYCERIN (NITROSTAT) 0.4 MG SL tablet Place 1 tablet (0.4 mg total) under the tongue every 5 (five) minutes as needed for chest pain. 08/29/13   Ward Givens, MD  omeprazole (PRILOSEC) 20 MG capsule Take 1 capsule (20 mg total) by mouth daily. 01/26/13 01/26/14  Mardella Layman, MD  potassium chloride SA (K-DUR,KLOR-CON) 20 MEQ tablet Take 10 mEq by mouth daily.    Historical Provider, MD  ramipril (ALTACE) 10 MG capsule Take 10 mg by mouth at bedtime.    Historical Provider, MD  simvastatin (ZOCOR) 40 MG tablet Take 1 tablet (40 mg total) by mouth at bedtime. 04/11/13   [name], MD  tamsulosin (FLOMAX) 0.4 MG CAPS capsule Take 0.8 mg by mouth at bedtime.    Historical Provider, MD  Wheat Dextrin (BENEFIBER) POWD Take 1 scoop by mouth daily.     Historical Provider, MD  Zinc 50 MG TABS Take 50 mg by mouth daily.      Historical Provider, MD    FAMILY HISTORY: History  reviewed. No pertinent family history.  SOCIAL HISTORY:  reports that he quit smoking about 59 years ago. He does not have any smokeless tobacco history on file. He reports that he does not drink alcohol or use illicit drugs.  REVIEW OF SYSTEMS:  Constitutional:   No  weight loss, night sweats,  Fevers, chills, fatigue.  HEENT:    No headaches, Difficulty swallowing,Tooth/dental problems,Sore throat,   Cardio-vascular: No chest pain,  Orthopnea, PND, swelling in lower extremities, anasarca,  dizziness, palpitations  GI:  No heartburn, indigestion,  abdominal pain, nausea, vomiting, diarrhea, change in bowel habits, loss of appetite  Resp: No shortness of breath with exertion or at rest.  No excess mucus, no productive cough, No non-productive cough,  No coughing up of blood.  Skin:  no rash or lesions.  GU:  no dysuria, change in color of urine, no urgency or frequency.  No flank pain.  Musculoskeletal: No joint pain or swelling.  No decreased range of motion.  No back pain.  Psych: No change in mood or affect. No depression or anxiety.  No memory loss.   PHYSICAL EXAM: Blood pressure 124/52, pulse 60, resp. rate 10, height 5\' 5"  (1.651 m), weight 65.318 kg (144 lb), SpO2 100.00%.  General appearance :Awake, somewhat lethargic, but opens eyes, follows commands, during my evaluation seems to be moving all 4 extremities. Says 1 or 2 words- speech seems to be clear.  HEENT: Atraumatic and Normocephalic, pupils equally reactive to light and accomodation Neck: supple, no JVD. No cervical lymphadenopathy.  Chest:Good air entry bilaterally, no added sounds  CVS: S1 S2 regular,Systolic  murmurs.  Abdomen: Bowel sounds present, Non tender and not distended with no gaurding, rigidity or rebound. Extremities: B/L Lower Ext shows no edema, both legs are warm to touch Neurology: right upper extremity 5/5, left upper extremity 5/5. Still very lethargic, seems to be moving both lower  extremities- unable to evaluate further. Only follows some commands.  Skin:No Rash Wounds:N/A  LABS ON ADMISSION:   Recent Labs  09/13/13 1717 09/13/13 1736  NA 134* 134*  K 4.2 4.1  CL 95* 97  CO2 26  --   GLUCOSE 98 99  BUN 21 23  CREATININE 1.27 1.40*  CALCIUM 9.0  --     Recent Labs  09/13/13 1717  AST 18  ALT 10  ALKPHOS 60  BILITOT 0.3  PROT 7.3  ALBUMIN 4.2   No results found for this basename: LIPASE, AMYLASE,  in the last 72 hours  Recent Labs  09/13/13 1717 09/13/13 1736  WBC 8.3  --   NEUTROABS 5.2  --   HGB 12.4* 13.3  HCT 37.9* 39.0  MCV 87.9  --   PLT 187  --    No results found for this basename: CKTOTAL, CKMB, CKMBINDEX, TROPONINI,  in the last 72 hours No results found for this basename: DDIMER,  in the last 72 hours No components found with this basename: POCBNP,    RADIOLOGIC STUDIES ON ADMISSION: Ct Head Wo Contrast  09/13/2013   CLINICAL DATA:  Seizure, code stroke  EXAM: CT HEAD WITHOUT CONTRAST  TECHNIQUE: Contiguous axial images were obtained from the base of the skull through the vertex without intravenous contrast.  COMPARISON:  None.  FINDINGS: There is moderate to severe atrophy. There is mild to moderate low attenuation in the deep white matter. There is no evidence of vascular territory infarct. There is no hemorrhage or extra-axial fluid. There is no evidence of mass or hydrocephalus. Calvarium is intact.  IMPRESSION: No acute intracranial abnormalities. Critical Value/emergent results were called by telephone at the time of interpretation on 09/13/2013 at 5:34 pm to Dr. Ritta SlotMCNEILL KIRKPATRICK , who verbally acknowledged these results.   Electronically Signed   By: Esperanza Heiraymond  Rubner M.D.   On: 09/13/2013 17:34     EKG: Independently reviewed. paced rhythm   ASSESSMENT AND PLAN: Present on Admission:  . 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 Already  loaded with Keppra her in the emergency room. Seems to be slowly improving and becoming more awake. We'll check an EEG, repeat a CT of the head tomorrow. Unable to do MRI because of pacemaker. Continue with Keppra. Await further input from neurology. For now would not commence further stroke workup, until further input from neurology or repeat CT head in and stated  . Encephalopathy acute - Suspect encephalopathy likely secondary to post ictal state. Seems to be slowly resolving. CT head negative for acute abnormalities.   . Ischemic cardiomyopathy/chronic systolic heart failure  - Currently compensated, continue with Lasix, Coreg and Ramipril  . Hyperlipidemia - Continue statins.   . Essential hypertension, benign - Controlled, continue with Coreg, Lasix, ramipril.   [name] BPH (benign prostatic hypertrophy) with urinary retention - Has a Foley catheter in place, is apparently scheduled for a urologic procedure in the next few days. Continue Foley catheter, continue Flomax and Avodart.   [name] PACEMAKER, PERMANENT - Monitor in telemetry.   Further plan will depend as patient's clinical course evolves and further radiologic and laboratory data become available. Patient will be monitored closely.  Above noted plan was discussed with patient/spouse/daughter, they were in agreement.   DVT Prophylaxis: Prophylactic heparin  Code Status: DNR-confirmed with spouse and daughter at bedside   Total time spent for admission equals 45 minutes.  [facility] Pager [phone]  If 7PM-7AM, please contact night-coverage [email or web address] Password [email or web address] 09/13/2013, 9:49 PM  **Disclaimer: This note may have been dictated with voice recognition software. Similar sounding words can inadvertently be transcribed and this note may contain transcription errors which may not have been corrected upon publication of note.**

## 2013-09-13 NOTE — ED Notes (Addendum)
Pt sleeping in response to medication. No seizure like activity observed at present.

## 2013-09-13 NOTE — Consult Note (Signed)
Neurology Consultation Reason for Consult: Confusion Referring Physician: Rosalia Hammersay, D.  CC: Confusion  History is obtained from: Patient  HPI: Bill Cunningham is a 80 y.o. male with a history of heart disease including ischemic cardiomyopathy who presents with confusion that started around 1:30 PM. He was normal when his wife went to make a phone call at 1:30 PM, but when she returned he was standing in the hallway and appeared to have behavioral arrest. He was drooling, and appeared confused and therefore his wife finally ended up calling 911.   Of note, en route EMS reported repeated episodes of forced gaze deviation accompanied by behavioral arrest.  LKW: 1:30 PM tpa given?: no, outside of window   ROS: A 14 point ROS was performed and is negative except as noted in the HPI.   Past Medical History  Diagnosis Date  . Hyperlipidemia   . LBBB (left bundle branch block)   . Depression   . COPD (chronic obstructive pulmonary disease)   . Calcium oxalate renal stones   . Dizziness   . GERD (gastroesophageal reflux disease)   . Diverticulosis   . IBS (irritable bowel syndrome)   . Leg cramps     not vascular in origin   . Liver cyst   . Wide-complex tachycardia     post op - on a beta blocker   . Sinus congestion   . Bleeding nose     cauteriszed dr Lazarus SalinesWolicki  / cauterization repeated 10/11  . Coronary artery disease     Nuclear June 03, 2010, scar anterior wall apex and inferior wall, no definite ischemia,    not gated  . Intolerance of drug     Niaspan  . S/P aortic valve replacement with bioprosthetic valve     echo.[name]. April, 2011... good valve function... mild AI  . S/P CABG (coronary artery bypass graft) January, 2007  . LV dysfunction     EF 45% / EF 25-30%, echo, April, 2011 / EF 25-30%, echo, April, 2012, akinesis of the inferior and posterior walls  . Ischemic cardiomyopathy     EF followed carefully  . Carotid artery disease     Doppler, 2011, bilateral 40-59%    /    Doppler, March, 2013, no change, bilateral 40-59%  . BPH (benign prostatic hyperplasia)     2013  . Cough     January, 2014  . Ejection fraction < 50%     Family History: Does not answer  Social History: Tob: Former smoker  Exam: Current vital signs: BP 116/49  Pulse 60  Resp 12  Ht 5\' 5"  (1.651 m)  Wt 65.318 kg (144 lb)  BMI 23.96 kg/m2  SpO2 100% Vital signs in last 24 hours: Pulse Rate:  [56-61] 60 (08/06 1830) Resp:  [12-14] 12 (08/06 1830) BP: (106-116)/(45-58) 116/49 mmHg (08/06 1830) SpO2:  [95 %-100 %] 100 % (08/06 1830) Weight:  [65.318 kg (144 lb)] 65.318 kg (144 lb) (08/06 1732)  General: In bed, NAD CV:  Regular rate and rhythm Mental Status: Patient initially is responsive, answering questions including year, name, age. When asked month, he does have behavioral arrest and ceases talking. This lasted 10-20 seconds and was accompanied by left facial twitching. Cranial Nerves: II: Visual Fields are full. Pupils are equal, round, and reactive to light.  Discs are difficult to visualize III,IV, VI: He has a left gaze preference, but is able to cross midline to the right. V: Facial sensation is symmetric to temperature  VII: Facial movement is notable for a left facial droop VIII: hearing is intact to voice X: Uvula elevates symmetrically XI: Shoulder shrug is symmetric. XII: tongue deviates to the left Motor: Tone is normal. Bulk is normal. 5/5 strength was present on the right side, he has 4/5 strength on the left though he did not drift. Sensory: Sensation is symmetric, but patient does extinguish to double simultaneous stimulation Deep Tendon Reflexes: 2+ and symmetric in the biceps and patellae.  Cerebellar: FNF intact Gait: Not assessed due to acute nature of evaluation and multiple medical monitors in ED setting.    I have reviewed labs in epic and the results pertinent to this consultation are: CMP-unremarkable  I have reviewed the images  obtained: CT head-unremarkable  Impression: 80 year old male with left-sided weakness and neglect. I suspect that he had a stroke, however the facial twitching is suggestive that this is contemplated by recurrent seizures. I would favor treating with Keppra, and getting an EEG in the morning. If his symptoms resolve, then it could be that this is because of todd's paralysis.  Recommendations: 1) Keppra 1000 mg x1 followed by 500 mg twice a day 2) EEG 3) repeat CT scan in the morning, stroke workup is positive. 4) will continue to follow   Ritta Slot, MD [facility] [phone]  If 7pm- 7am, please page neurology on call as listed in AMION.

## 2013-09-13 NOTE — ED Provider Notes (Signed)
CSN: [id number]     Arrival date & time 09/13/13  1710 History   First MD Initiated Contact with Patient 09/13/13 1733     Chief Complaint  Patient presents with  . Code Stroke  . Seizures   LAMONTAE RICARDO is an 80 year old Caucasian male with PMH of HRT, CAD status post five-vessel CABG, emphysema, and ischemic cardiomyopathy status post pacemaker. He presents today with strokelike symptoms. Wife says that around 1PM today patient answered the phone and someone had hung up on him. Instead of hanging up, pt stood there confused. Wife noticed drooling and lip twitching. Has never had a stroke before. Takes a daily 81 mg ASA. Had been normal earlier today.   (Consider location/radiation/quality/duration/timing/severity/associated sxs/prior Treatment) Patient is a 80 y.o. male presenting with seizures and Acute Neurological Problem.  Seizures Cerebrovascular Accident This is a new problem. The current episode started today. Associated symptoms include weakness. Pertinent negatives include no abdominal pain, chest pain, chills, fever, headaches, nausea or vomiting. Associated symptoms comments: Facial droop, confusion..    Past Medical History  Diagnosis Date  . Hyperlipidemia   . LBBB (left bundle branch block)   . Depression   . COPD (chronic obstructive pulmonary disease)   . Calcium oxalate renal stones   . Dizziness   . GERD (gastroesophageal reflux disease)   . Diverticulosis   . IBS (irritable bowel syndrome)   . Leg cramps     not vascular in origin   . Liver cyst   . Wide-complex tachycardia     post op - on a beta blocker   . Sinus congestion   . Bleeding nose     cauteriszed dr Lazarus Salines  / cauterization repeated 10/11  . Coronary artery disease     Nuclear June 03, 2010, scar anterior wall apex and inferior wall, no definite ischemia,    not gated  . Intolerance of drug     Niaspan  . S/P aortic valve replacement with bioprosthetic valve     echo.[name] April, 2011... good  valve function... mild AI  . S/P CABG (coronary artery bypass graft) January, 2007  . LV dysfunction     EF 45% / EF 25-30%, echo, April, 2011 / EF 25-30%, echo, April, 2012, akinesis of the inferior and posterior walls  . Ischemic cardiomyopathy     EF followed carefully  . Carotid artery disease     Doppler, 2011, bilateral 40-59%    /   Doppler, March, 2013, no change, bilateral 40-59%  . BPH (benign prostatic hyperplasia)     2013  . Cough     January, 2014  . Ejection fraction < 50%    Past Surgical History  Procedure Laterality Date  . Cholecystectomy    . Aortic valve replacement  02/2005  . Insert / replace / remove pacemaker      must call rep to interrogate pacemaker [phone], [employer], older model  . Coronary artery bypass graft      x5  -- last one in 07  . Lithotripsy    . Mosaic ultra porcine heart valve      medtronic,  [id number], 03/02/2005, MD clarence Cornelius Moras [phone], device questions [phone]   History reviewed. No pertinent family history. History  Substance Use Topics  . Smoking status: Former Smoker    Quit date: 02/08/1954  . Smokeless tobacco: Not on file  . Alcohol Use: No    Review of Systems  Unable to perform ROS Constitutional: Negative for  fever and chills.  Respiratory: Negative for shortness of breath.   Cardiovascular: Negative for chest pain, palpitations and leg swelling.  Gastrointestinal: Negative for nausea, vomiting, abdominal pain, diarrhea, constipation and abdominal distention.  Genitourinary: Negative for dysuria, frequency, flank pain and decreased urine volume.  Neurological: Positive for seizures, facial asymmetry (left sided facial droop) and weakness. Negative for dizziness, speech difficulty, light-headedness and headaches.  All other systems reviewed and are negative.     Allergies  Niaspan and Sulfonamide derivatives  Home Medications   Prior to Admission medications   Medication Sig Start Date End  Date Taking? Authorizing Provider  aspirin EC 81 MG tablet Take 81 mg by mouth at bedtime.     Historical Provider, MD  AVODART 0.5 MG capsule Take 1 tablet by mouth every evening.  09/28/11   Historical Provider, MD  carvedilol (COREG) 12.5 MG tablet Take 12.5 mg by mouth 2 (two) times daily with a meal.    Historical Provider, MD  docusate sodium (COLACE) 100 MG capsule Take 100 mg by mouth at bedtime as needed for mild constipation.     Historical Provider, MD  fexofenadine (ALLEGRA) 60 MG tablet Take 1 tablet (60 mg total) by mouth 2 (two) times daily. 07/08/13   Gwyneth Sprout, MD  furosemide (LASIX) 40 MG tablet Take 60 mg by mouth daily. Take one and a half tablets by mouth daily.    Historical Provider, MD  meclizine (ANTIVERT) 12.5 MG tablet Take 1 tablet (12.5 mg total) by mouth 3 (three) times daily as needed for dizziness. 08/29/13   Ward Givens, MD  Multiple Vitamin (MULTIVITAMIN) tablet Take 1 tablet by mouth daily.      Historical Provider, MD  nitroGLYCERIN (NITROSTAT) 0.4 MG SL tablet Place 1 tablet (0.4 mg total) under the tongue every 5 (five) minutes as needed for chest pain. 11/28/11   Richarda Overlie, MD  nitroGLYCERIN (NITROSTAT) 0.4 MG SL tablet Place 1 tablet (0.4 mg total) under the tongue every 5 (five) minutes as needed for chest pain. 08/29/13   Ward Givens, MD  omeprazole (PRILOSEC) 20 MG capsule Take 1 capsule (20 mg total) by mouth daily. 01/26/13 01/26/14  Mardella Layman, MD  potassium chloride SA (K-DUR,KLOR-CON) 20 MEQ tablet Take 10 mEq by mouth daily.    Historical Provider, MD  ramipril (ALTACE) 10 MG capsule Take 10 mg by mouth at bedtime.    Historical Provider, MD  simvastatin (ZOCOR) 40 MG tablet Take 1 tablet (40 mg total) by mouth at bedtime. 04/11/13   [name], MD  tamsulosin (FLOMAX) 0.4 MG CAPS capsule Take 0.8 mg by mouth at bedtime.    Historical Provider, MD  Wheat Dextrin (BENEFIBER) POWD Take 1 scoop by mouth daily.     Historical Provider, MD   Zinc 50 MG TABS Take 50 mg by mouth daily.      Historical Provider, MD   BP 124/56  Pulse 59  Resp 11  Ht 5\' 5"  (1.651 m)  Wt 144 lb (65.318 kg)  BMI 23.96 kg/m2  SpO2 100% Physical Exam  Nursing note and vitals reviewed. Constitutional: He is oriented to person, place, and time. He appears well-developed and well-nourished. No distress.  HENT:  Head: Normocephalic and atraumatic.  Eyes: Pupils are equal, round, and reactive to light.  Neck: Normal range of motion.  Cardiovascular: Normal rate, regular rhythm, normal heart sounds and intact distal pulses.  Exam reveals no gallop and no friction rub.   No  murmur heard. Pulmonary/Chest: Effort normal and breath sounds normal. No respiratory distress. He has no wheezes. He has no rales. He exhibits no tenderness.  Abdominal: Soft. Bowel sounds are normal. He exhibits no distension and no mass. There is no tenderness. There is no rebound and no guarding.  Musculoskeletal: Normal range of motion.  Lymphadenopathy:    He has no cervical adenopathy.  Neurological: He is alert and oriented to person, place, and time. A cranial nerve deficit (left facial droop. ) is present.  Left arm weaker than right.   Skin: Skin is warm and dry. He is not diaphoretic.    ED Course  Procedures (including critical care time) Labs Review Labs Reviewed  CBC - Abnormal; Notable for the following:    Hemoglobin 12.4 (*)    HCT 37.9 (*)    All other components within normal limits  COMPREHENSIVE METABOLIC PANEL - Abnormal; Notable for the following:    Sodium 134 (*)    Chloride 95 (*)    GFR calc non Af Amer 48 (*)    GFR calc Af Amer 56 (*)    All other components within normal limits  URINALYSIS, ROUTINE W REFLEX MICROSCOPIC - Abnormal; Notable for the following:    Hgb urine dipstick TRACE (*)    Leukocytes, UA LARGE (*)    All other components within normal limits  URINE MICROSCOPIC-ADD ON - Abnormal; Notable for the following:    Bacteria,  UA FEW (*)    Casts HYALINE CASTS (*)    All other components within normal limits  CBG MONITORING, ED - Abnormal; Notable for the following:    Glucose-Capillary 103 (*)    All other components within normal limits  I-STAT CHEM 8, ED - Abnormal; Notable for the following:    Sodium 134 (*)    Creatinine, Ser 1.40 (*)    All other components within normal limits  MRSA PCR SCREENING  PROTIME-INR  APTT  DIFFERENTIAL  BASIC METABOLIC PANEL  CBC  HEMOGLOBIN A1C  LIPID PANEL  CBG MONITORING, ED  I-STAT TROPOININ, ED    Imaging Review Ct Head Wo Contrast  09/13/2013   CLINICAL DATA:  Seizure, code stroke  EXAM: CT HEAD WITHOUT CONTRAST  TECHNIQUE: Contiguous axial images were obtained from the base of the skull through the vertex without intravenous contrast.  COMPARISON:  None.  FINDINGS: There is moderate to severe atrophy. There is mild to moderate low attenuation in the deep white matter. There is no evidence of vascular territory infarct. There is no hemorrhage or extra-axial fluid. There is no evidence of mass or hydrocephalus. Calvarium is intact.  IMPRESSION: No acute intracranial abnormalities. Critical Value/emergent results were called by telephone at the time of interpretation on 09/13/2013 at 5:34 pm to Dr. Ritta Slot , who verbally acknowledged these results.   Electronically Signed   By: Esperanza Heir M.D.   On: 09/13/2013 17:34     EKG Interpretation None      MDM   80 yo male w/stroke like sx since 1PM today. Please see HPI for details. On exam, pt in NAD, AFVSS. Has left sided facial droop w/smile. Head CT and labs ordered. Neurology has seen the pt and have given ativan. Will load w/keppra as they feel this may be seizure induced.   CT head shows no acute ICH or infarct seen. Labs wnl. EKG shows NSR unchanged from prior.   Pt admitted to hospitalist service w/Neurology consulting. Please see their notes for further  details regarding the remainder of his  hospital course. Throughout his time in the ED, pt remained stable.    Final diagnoses:  Seizure  BPH (benign prostatic hypertrophy) with urinary retention  Chronic systolic CHF (congestive heart failure)  Encephalopathy acute  Essential hypertension, benign    Pt was seen under the supervision of Dr. Rosalia Hammersay.     Rachelle HoraKeri Drianna Chandran, MD 09/14/13 0100

## 2013-09-13 NOTE — Code Documentation (Signed)
Code stroke called at 1649, [facility]10.  AS per EMS and Wife, LSN 1330 wife found him drooling and confused and left gaze.  Patient noted to having a twitching of his left side of face.  Ativan given as per MD.  NIHSS 9.

## 2013-09-13 NOTE — ED Notes (Signed)
Per EMS- patient was going to watch tv at 1330. Wife found patient drooling and acting strange. Left sided gaze noted. Grips equal. No effort of left leg against gravity. CBG 113. 18 gPIV inserted PTA. Pt was ambulating on scene. No shuffling gate on scene.

## 2013-09-14 ENCOUNTER — Ambulatory Visit: Payer: Medicare Other | Admitting: Cardiology

## 2013-09-14 ENCOUNTER — Inpatient Hospital Stay (HOSPITAL_COMMUNITY): Payer: Medicare Other

## 2013-09-14 LAB — CBC
HEMATOCRIT: 33.9 % — AB (ref 39.0–52.0)
HEMOGLOBIN: 11.4 g/dL — AB (ref 13.0–17.0)
MCH: 29.3 pg (ref 26.0–34.0)
MCHC: 33.6 g/dL (ref 30.0–36.0)
MCV: 87.1 fL (ref 78.0–100.0)
Platelets: 174 10*3/uL (ref 150–400)
RBC: 3.89 MIL/uL — ABNORMAL LOW (ref 4.22–5.81)
RDW: 13 % (ref 11.5–15.5)
WBC: 8.3 10*3/uL (ref 4.0–10.5)

## 2013-09-14 LAB — GLUCOSE, CAPILLARY
GLUCOSE-CAPILLARY: 120 mg/dL — AB (ref 70–99)
GLUCOSE-CAPILLARY: 93 mg/dL (ref 70–99)
Glucose-Capillary: 89 mg/dL (ref 70–99)
Glucose-Capillary: 94 mg/dL (ref 70–99)
Glucose-Capillary: 99 mg/dL (ref 70–99)
Glucose-Capillary: 94 mg/dL (ref 70–99)

## 2013-09-14 LAB — HEMOGLOBIN A1C
HEMOGLOBIN A1C: 6.3 % — AB (ref <5.7)
MEAN PLASMA GLUCOSE: 134 mg/dL — AB (ref <117)

## 2013-09-14 LAB — MRSA PCR SCREENING: MRSA BY PCR: NEGATIVE

## 2013-09-14 LAB — BASIC METABOLIC PANEL
Anion gap: 12 (ref 5–15)
BUN: 19 mg/dL (ref 6–23)
CHLORIDE: 101 meq/L (ref 96–112)
CO2: 25 meq/L (ref 19–32)
Calcium: 8.5 mg/dL (ref 8.4–10.5)
Creatinine, Ser: 1.16 mg/dL (ref 0.50–1.35)
GFR calc Af Amer: 62 mL/min — ABNORMAL LOW (ref >90)
GFR calc non Af Amer: 54 mL/min — ABNORMAL LOW (ref >90)
Glucose, Bld: 87 mg/dL (ref 70–99)
POTASSIUM: 4 meq/L (ref 3.7–5.3)
SODIUM: 138 meq/L (ref 137–147)

## 2013-09-14 LAB — LIPID PANEL
CHOL/HDL RATIO: 2.8 ratio
Cholesterol: 107 mg/dL (ref 0–200)
HDL: 38 mg/dL — AB (ref >39)
LDL Cholesterol: 59 mg/dL (ref 0–99)
Triglycerides: 48 mg/dL (ref <150)
VLDL: 10 mg/dL (ref 0–40)

## 2013-09-14 MED ORDER — HALOPERIDOL LACTATE 5 MG/ML IJ SOLN: 5.0000 mg | Freq: Once | INTRAMUSCULAR | Status: DC

## 2013-09-14 MED ORDER — LEVETIRACETAM IN NACL 1000 MG/100ML IV SOLN
1000.0000 mg | Freq: Once | INTRAVENOUS | Status: AC
  Administered 2013-09-14: 1000 mg via INTRAVENOUS
  Filled 2013-09-14: qty 100

## 2013-09-14 MED ORDER — LEVETIRACETAM 500 MG PO TABS: 500.0000 mg | ORAL_TABLET | Freq: Two times a day (BID) | ORAL | Status: DC

## 2013-09-14 MED ORDER — LORAZEPAM 2 MG/ML IJ SOLN
1.0000 mg | Freq: Once | INTRAMUSCULAR | Status: AC
  Administered 2013-09-14: 1 mg via INTRAVENOUS
  Filled 2013-09-14: qty 1

## 2013-09-14 NOTE — Procedures (Signed)
History: 80 year old male presenting with seizures  Sedation: None  Technique: This is a 17 channel routine scalp EEG performed at the bedside with bipolar and monopolar montages arranged in accordance to the international 10/20 system of electrode placement. One channel was dedicated to EKG recording.    Background: There is a well defined posterior dominant rhythm of 8 Hz that attenuates with eye opening, this is better seen on the left than right. There is frequent sharp activity in the right hemisphere, most prominent at F8, F4. There are 3 electrographic seizures lasting from 60-90 seconds during this 20 minute recording.  Photic stimulation: Physiologic driving is not performed  EEG Abnormalities: 1) 3 electrographic seizures arising from F8, F4, Fp2  Clinical Interpretation: This EEG recorded 3 [seizures arising from the right frontal region  Ritta SlotMcNeill Janelli Welling, MD [facility] [phone]  If 7pm- 7am, please page neurology on call as listed in [facility]ION.

## 2013-09-14 NOTE — Progress Notes (Signed)
EEG Completed; Results Pending  

## 2013-09-14 NOTE — Progress Notes (Signed)
Patient down to EEG.   Wife and daughter in room.

## 2013-09-14 NOTE — Evaluation (Signed)
Physical Therapy Evaluation Patient Details Name: Bill Cunningham MRN: [id number] DOB: Feb 13, 1921 Today's Date: 09/14/2013   History of Present Illness  CALIXTO Cunningham is a 80 y.o. male who presents with confusion and 'behavioral arrest'. Pt was drooling, and appeared confused. Neuro workup underway.  Clinical Impression  Patient demonstrates deficits in functional mobility as indicated below. Will need continued skilled PT to address deficits and maximize function. Will see as indicated and progress as tolerated.     Follow Up Recommendations Home health PT;Supervision/Assistance - 24 hour;Supervision for mobility/OOB    Equipment Recommendations  Rolling walker with 5" wheels    Recommendations for Other Services       Precautions / Restrictions Precautions Precautions: Fall Restrictions Weight Bearing Restrictions: No      Mobility  Bed Mobility Overal bed mobility: Needs Assistance Bed Mobility: Supine to Sit     Supine to sit: Supervision     General bed mobility comments: increased time to perform, disregard for cords and lines attached, poor safety awareness and increased impuslivity. VCs for controlled movement and task sequencing  Transfers Overall transfer level: Needs assistance Equipment used: None Transfers: Sit to/from Stand Sit to Stand: Min assist         General transfer comment: min assist for stability  Ambulation/Gait Ambulation/Gait assistance: Min guard;Min assist Ambulation Distance (Feet): 210 Feet Assistive device: Rolling walker (2 wheeled) Gait Pattern/deviations: Step-through pattern;Decreased stride length;Trunk flexed;Drifts right/left;Narrow base of support;Shuffle Gait velocity: VCs for upright posture and attention to direction (question visual deficits) Gait velocity interpretation: <1.8 ft/sec, indicative of risk for recurrent falls General Gait Details: Assist for stability and safety with navigation  Stairs             Wheelchair Mobility    Modified Rankin (Stroke Patients Only)       Balance                                             Pertinent Vitals/Pain Pain Assessment: No/denies pain    Home Living Family/patient expects to be discharged to:: Private residence Living Arrangements: Spouse/significant other Available Help at Discharge: Family Type of Home: House Home Access: Stairs to enter Entrance Stairs-Rails: Right Entrance Stairs-Number of Steps: 3 Home Layout: One level Home Equipment: None      Prior Function Level of Independence: Independent         Comments: reported by patient     Hand Dominance   Dominant Hand: Right    Extremity/Trunk Assessment   Upper Extremity Assessment: Generalized weakness           Lower Extremity Assessment: Generalized weakness         Communication   Communication: HOH  Cognition Arousal/Alertness: Awake/alert Behavior During Therapy: Impulsive;WFL for tasks assessed/performed Overall Cognitive Status: Impaired/Different from baseline Area of Impairment: Following commands;Safety/judgement;Awareness;Problem solving       Following Commands: Follows one step commands consistently;Follows one step commands with increased time Safety/Judgement: Decreased awareness of safety;Decreased awareness of deficits Awareness: Emergent Problem Solving: Slow processing;Difficulty sequencing;Requires verbal cues;Requires tactile cues General Comments: patient alert and oriented during session but demonstrates deficis in higher level cognition. Patient instructed to walk to hall and instead he walked over to sink, patient again given cues to walk to hall and patient turn and walked towards bed, when asked what he was instructed to do he did recall  the task but had confusion carrying it out.    General Comments      Exercises        Assessment/Plan    PT Assessment Patient needs continued PT services  PT  Diagnosis Difficulty walking;Abnormality of gait;Generalized weakness;Altered mental status   PT Problem List Decreased strength;Decreased range of motion;Decreased activity tolerance;Decreased balance;Decreased mobility;Decreased cognition;Decreased knowledge of use of DME  PT Treatment Interventions DME instruction;Gait training;Stair training;Functional mobility training;Therapeutic activities;Therapeutic exercise;Balance training;Patient/family education   PT Goals (Current goals can be found in the Care Plan section) Acute Rehab PT Goals PT Goal Formulation: With patient Time For Goal Achievement: 09/28/13 Potential to Achieve Goals: Good    Frequency Min 4X/week   Barriers to discharge        Co-evaluation               End of Session Equipment Utilized During Treatment: Gait belt Activity Tolerance: Patient tolerated treatment well Patient left: in chair;with call bell/phone within reach Nurse Communication: Mobility status         Time: [id number] PT Time Calculation (min): 26 min   Charges:   PT Evaluation $Initial PT Evaluation Tier I: 1 Procedure PT Treatments $Gait Training: 8-22 mins $Therapeutic Activity: 8-22 mins   PT G CodesFabio Asa:          Cecille Mcclusky J 09/14/2013, 1:23 PM  Charlotte Crumbevon Evart Mcdonnell, PT DPT  [phone]

## 2013-09-14 NOTE — Care Management Note (Addendum)
    Page 1 of 2   09/18/2013     1:44:40 PM CARE MANAGEMENT NOTE 09/18/2013  Patient:  Bill Cunningham, Bill Cunningham   Account Number:  [id number]  Date Initiated:  09/14/2013  Documentation initiated by:  Marvetta Gibbons  Subjective/Objective Assessment:   Pt admitted with stroke like symptoms and ?Sz     Action/Plan:   PTA pt lived at home with spouse- PT eval   Anticipated DC Date:  09/14/2013   Anticipated DC Plan:  [place]  CM consult      [place]   Choice offered to / List presented to:  C-4 Adult Children   DME arranged  [place]      DME agency  [place] arranged  [place].   Status of service:  Completed, signed off Medicare Important Message given?  YES (If response is "NO", the following Medicare IM given date fields will be blank) Date Medicare IM given:  09/17/2013 Medicare IM given by:  Lorne Skeens Date Additional Medicare IM given:   Additional Medicare IM given by:    Discharge Disposition:  [place]  Per UR Regulation:  Reviewed for med. necessity/level of care/duration of stay  If discussed at [place] of Stay Meetings, dates discussed:    Comments:  09/18/13 Lorne Skeens RN,MSN, CM- Colletta [place] with [facility] was notified of addition of HHOT/RN to [facility] orders for discharge home today.   09/17/13 [place], MSN, CM- Met with patient and family to provide Medicare IM letter.    09/14/13- [place] RN, BSN [phone] Pt for d/c today, order for [place] and RW-DME- in to speak with pt/wife and daughter at bedside- pt does not have a RW at home and would like one prior to discharge- also agreeable to [facility]- list of [place] agencies provided- per choice wife and daughter have chosen [facility] for services- referral called to Butch Penny with [facility] for HH-PT- services to begin within 24-48  hr post discharge- call also made to [place] with [facility] for DME need- RW to be brought to room prior to discharge.

## 2013-09-14 NOTE — Evaluation (Signed)
Clinical/Bedside Swallow Evaluation Patient Details  Name: Bill Cunningham MRN: [id number] Date of Birth: 1921-09-12  Today's Date: 09/14/2013 Time: 0900-0920 SLP Time Calculation (min): 20 min  Past Medical History:  Past Medical History  Diagnosis Date  . Hyperlipidemia   . LBBB (left bundle branch block)   . Depression   . COPD (chronic obstructive pulmonary disease)   . Calcium oxalate renal stones   . Dizziness   . GERD (gastroesophageal reflux disease)   . Diverticulosis   . IBS (irritable bowel syndrome)   . Leg cramps     not vascular in origin   . Liver cyst   . Wide-complex tachycardia     post op - on a beta blocker   . Sinus congestion   . Bleeding nose     cauteriszed dr Lazarus Salines  / cauterization repeated 10/11  . Coronary artery disease     Nuclear June 03, 2010, scar anterior wall apex and inferior wall, no definite ischemia,    not gated  . Intolerance of drug     Niaspan  . S/P aortic valve replacement with bioprosthetic valve     echo.[name] April, 2011... good valve function... mild AI  . S/P CABG (coronary artery bypass graft) January, 2007  . LV dysfunction     EF 45% / EF 25-30%, echo, April, 2011 / EF 25-30%, echo, April, 2012, akinesis of the inferior and posterior walls  . Ischemic cardiomyopathy     EF followed carefully  . Carotid artery disease     Doppler, 2011, bilateral 40-59%    /   Doppler, March, 2013, no change, bilateral 40-59%  . BPH (benign prostatic hyperplasia)     2013  . Cough     January, 2014  . Ejection fraction < 50%    Past Surgical History:  Past Surgical History  Procedure Laterality Date  . Cholecystectomy    . Aortic valve replacement  02/2005  . Insert / replace / remove pacemaker      must call rep to interrogate pacemaker [phone], [employer], older model  . Coronary artery bypass graft      x5  -- last one in 07  . Lithotripsy    . Mosaic ultra porcine heart valve      medtronic,  [id number], 03/02/2005, MD  clarence Cornelius Moras [phone], device questions [phone]   HPI:  80 y.o. male with a PMH of CHF, CAD, status post aortic replacement by prosthetic valve, permanent pacemaker implantation who presents today with the above noted complaint. Please note, patient still is somewhat confused and encephalopathic.   Assessment / Plan / Recommendation Clinical Impression   Patient presents with a mild oropharyngeal dysphagia characterized by mild swallow initiation delay and decreased mastication of solid textures. No overt s/s aspiration noted with any tested boluses, thin liquids, puree solids, regular solids. Patient able to feed self, but currently is with confusion and will benefit from assistance and encouragement for safety with PO intake at meals.    Aspiration Risk  Mild    Diet Recommendation Dysphagia 3 (Mechanical Soft);Thin liquid   Liquid Administration via: Cup Medication Administration: Whole meds with puree Supervision: Full supervision/cueing for compensatory strategies;Patient able to self feed Compensations: Slow rate;Small sips/bites Postural Changes and/or Swallow Maneuvers: Seated upright 90 degrees    Other  Recommendations Oral Care Recommendations: Oral care BID   Follow Up Recommendations  Other (comment) (do not anticipate SLP needs beyond hospital stay)    Frequency and Duration  min 1 x/week  2 weeks   Pertinent Vitals/Pain     SLP Swallow Goals     Swallow Study Prior Functional Status  Type of Home: House Available Help at Discharge: Family    General Date of Onset: 09/13/13 HPI: 80 y.o. male with a PMH of CHF, CAD, status post aortic replacement by prosthetic valve, permanent pacemaker implantation who presents today with the above noted complaint. Please note, patient still is somewhat confused and encephalopathic. Type of Study: Bedside swallow evaluation Previous Swallow Assessment: N/A Diet Prior to this Study: Information not available (no  family availble to confirm, probable that patient was on regular solids, thin liquids) Temperature Spikes Noted: No Respiratory Status: Room air History of Recent Intubation: No Behavior/Cognition: Alert;Pleasant mood;Confused Oral Cavity - Dentition: Missing dentition (patient stated he has upper and lower partials, which were not present during this BSE) Self-Feeding Abilities: Needs assist Baseline Vocal Quality: Clear Volitional Cough: Strong Volitional Swallow: Able to elicit    Oral/Motor/Sensory Function Overall Oral Motor/Sensory Function: Appears within functional limits for tasks assessed   Ice Chips Ice chips: Not tested   Thin Liquid Thin Liquid: Within functional limits Presentation: Cup;Self Fed Other Comments: No overt s/s aspiration    Nectar Thick Nectar Thick Liquid: Not tested   Honey Thick Honey Thick Liquid: Not tested   Puree Puree: Within functional limits Presentation: Spoon Other Comments: No overt s/s aspiration noted   Solid   GO    Solid: Impaired Oral Phase Impairments: Impaired mastication Pharyngeal Phase Impairments: Suspected delayed Swallow       Bill Cunningham, Bill Cunningham 09/14/2013,1:17 PM  Angela NevinJohn T. Yulianna Folse, MA, CCC-SLP [facility] Speech-Language Pathologist

## 2013-09-14 NOTE — Progress Notes (Addendum)
Subjective: Feels like he is back to baseline  Exam: Filed Vitals:   09/14/13 0841  BP:   Pulse:   Temp: 97.9 F (36.6 C)  Resp:    Gen: In bed, NAD MS: awake. Alert, interactive an in herd appropriate.  ZO:XWRUECN:PERRL, EOMI, VFF with no visual extinction.  Motor: 5/5 throuhgout  Sensory:intact to LT, no extinction.   CT head - negative  Impression: 80 yo M presenting with seizures. He is currently back to baseline. Given his age and the prolonged symptoms at onset, I would favor AED therapy from now on.   Recommendations: 1) Keppra 500mg  BID 2) EEG 3) Likely could D/C home following EEG.   Ritta SlotMcNeill Martinique Pizzimenti, MD [facility] [phone]  If 7pm- 7am, please page neurology on call as listed in AMION.   Addendum: pt had two seizures while connected to EEG. Given 1mg  IV ativan and loaded with keppra and increased to 1gm BID.   Ritta SlotMcNeill Nitin Mckowen, MD [facility] [phone]  If 7pm- 7am, please page neurology on call as listed in AMION.

## 2013-09-14 NOTE — Progress Notes (Signed)
PT Cancellation Note  Patient Details Name: Bill Cunningham MRN: [id number] DOB: 08-12-1921   Cancelled Treatment:    Reason Eval/Treat Not Completed: Patient not medically ready (active bedrest orders at this time)   Fabio AsaWerner, Kaidan Harpster J 09/14/2013, 8:10 AM Charlotte Crumbevon Charnese Federici, PT DPT  [phone]

## 2013-09-14 NOTE — Discharge Summary (Signed)
DISCHARGE SUMMARY  Bill Cunningham  MR#: [id number]  DOB:1921-05-09  Date of Admission: 09/13/2013 Date of Discharge: 09/14/2013  Attending Physician:Valori Hollenkamp T  Patient's NFA:[id number],HQIONGEX, MD  Consults: Neurology - Dr. Amada Jupiter   Disposition: D/C home   Follow-up Appts:     Follow-up Information   Follow up with Beverley Fiedler, MD. Schedule an appointment as soon as possible for a visit in 1 week.   Specialty:  Family Medicine   Contact information:   [address] [place] [address] [phone]       Call [facility]. (Call to make an appointment with the doctor of your choice for ongoing care of your seizure disorder.  Ask to be seen at soonest availabe next appointment.  )    Contact information:   [address] [place] [address] [phone]      Call Willa Rough, MD. (Call to reschedule a pre-operative evaluation/clinic visit.  )    Specialty:  Cardiology   Contact information:   [address] [place] [address] [phone]      Discharge Diagnoses: Newly diagnosed Seizure with Todd's Paresis  Encephalopathy acute Ischemic cardiomyopathy / chronic systolic heart failure + grade 3 diastolic CHF  Hyperlipidemia Essential hypertension, benign  BPH with urinary retention PACEMAKER, PERMANENT S/P aortic valve replacement with bioprosthetic valve   Initial presentation: 80 y.o. male with a history of chronic systolic heart failure, coronary artery disease, status post aortic replacement by prosthetic valve, permanent pacemaker implantation who presented today with confusion. Patient went to answer the phone, then his spouse noted that the patient was just standing there with the phone in his right hand and was somewhat confused. She also noticed some mild drooling and left lip twitching.  He never lost consciousness, he was able to follow some commands, she made him sit on the chair and called 911.    EMS reported episodes of forced gaze deviation with episodes of confusion. He was subsequently evaluated in the emergency room, neurology consultation was obtained, he was noted to have a left facial droop, and mild left-sided weakness and neglect. CT of the head done in the emergency room was negative for acute abnormalities.  Patient was suspected to have had an acute CVA versus seizure with Todd's paresis.   Hospital Course:  Seizure with Todd's Paresis  CT head negative for acute abnormalities - unable to do MRI because of pacemaker - loaded with Keppra in the emergency room - rapidly improved, w/ return to basline MS - Neurology feels is most c/w seizure - to continue AED - EEG to be read post d/c per Neurology   Encephalopathy acute secondary to post ictal state - resolved at time of d/c w/ mental status returned to baseline   Ischemic cardiomyopathy / chronic systolic heart failure + grade 3 diastolic CHF  Compensated clinically - to f/u with Dr. Myrtis Ser for pre-op clearance eval prior to BPH surgery   Hyperlipidemia No change in tx plan   Essential hypertension, benign controlled   BPH with urinary retention Admit w/ foley in place - scheduled for a urologic procedure in the next few days - UA c/w UTI, but findings could be due to colonization relate to urinary catheter, and in absence of sx, no fever, and normal WBC, will not risk abx tx (as most abx lower the seizure threshold)  PACEMAKER, PERMANENT  S/P aortic valve replacement with bioprosthetic valve  No clinical evidence of valve dysfxn  Medication List         aspirin EC 81 MG tablet  Take 81 mg by mouth at bedtime.     AVODART 0.5 MG capsule  Generic drug:  dutasteride  Take 0.5 mg by mouth every evening.     BENEFIBER Powd  Take 1 scoop by mouth daily.     carvedilol 12.5 MG tablet  Commonly known as:  COREG  Take 12.5 mg by mouth 2 (two) times daily with a meal.     docusate sodium 100 MG capsule    Commonly known as:  COLACE  Take 100 mg by mouth at bedtime as needed for mild constipation.     fexofenadine 60 MG tablet  Commonly known as:  ALLEGRA  Take 60 mg by mouth daily.     furosemide 40 MG tablet  Commonly known as:  LASIX  Take 60 mg by mouth daily. Take one and a half tablets by mouth daily.     levETIRAcetam 500 MG tablet  Commonly known as:  KEPPRA  Take 1 tablet (500 mg total) by mouth 2 (two) times daily.     multivitamin tablet  Take 1 tablet by mouth daily.     nitroGLYCERIN 0.4 MG SL tablet  Commonly known as:  NITROSTAT  Place 1 tablet (0.4 mg total) under the tongue every 5 (five) minutes as needed for chest pain.     omeprazole 20 MG capsule  Commonly known as:  PRILOSEC  Take 1 capsule (20 mg total) by mouth daily.     potassium chloride SA 20 MEQ tablet  Commonly known as:  K-DUR,KLOR-CON  Take 10 mEq by mouth daily.     ramipril 10 MG capsule  Commonly known as:  ALTACE  Take 10 mg by mouth at bedtime.     simvastatin 40 MG tablet  Commonly known as:  ZOCOR  Take 1 tablet (40 mg total) by mouth at bedtime.     tamsulosin 0.4 MG Caps capsule  Commonly known as:  FLOMAX  Take 0.8 mg by mouth at bedtime.     Zinc 50 MG Tabs  Take 50 mg by mouth daily after breakfast.       Day of Discharge BP 111/50  Pulse 63  Temp(Src) 97.4 F (36.3 C) (Oral)  Resp 18  Ht 5\' 7"  (1.702 m)  Wt 63.1 kg (139 lb 1.8 oz)  BMI 21.78 kg/m2  SpO2 93%  Physical Exam: General: No acute respiratory distress Lungs: Clear to auscultation bilaterally without wheezes or crackles Cardiovascular: Regular rate and rhythm without murmur gallop or rub normal S1 and S2 Abdomen: Nontender, nondistended, soft, bowel sounds positive, no rebound, no ascites, no appreciable mass Extremities: No significant cyanosis, clubbing, or edema bilateral lower extremities  Results for orders placed during the hospital encounter of 09/13/13 (from the past 24 hour(s))  CBG  MONITORING, ED     Status: Abnormal   Collection Time    09/13/13  5:12 PM      Result Value Ref Range   Glucose-Capillary 103 (*) 70 - 99 mg/dL  PROTIME-INR     Status: None   Collection Time    09/13/13  5:17 PM      Result Value Ref Range   Prothrombin Time 14.3  11.6 - 15.2 seconds   INR 1.11  0.00 - 1.49  APTT     Status: None   Collection Time    09/13/13  5:17 PM      Result  Value Ref Range   aPTT 29  24 - 37 seconds  CBC     Status: Abnormal   Collection Time    09/13/13  5:17 PM      Result Value Ref Range   WBC 8.3  4.0 - 10.5 K/uL   RBC 4.31  4.22 - 5.81 MIL/uL   Hemoglobin 12.4 (*) 13.0 - 17.0 g/dL   HCT 16.1 (*) 09.6 - 04.5 %   MCV 87.9  78.0 - 100.0 fL   MCH 28.8  26.0 - 34.0 pg   MCHC 32.7  30.0 - 36.0 g/dL   RDW 40.9  81.1 - 91.4 %   Platelets 187  150 - 400 K/uL  DIFFERENTIAL     Status: None   Collection Time    09/13/13  5:17 PM      Result Value Ref Range   Neutrophils Relative % 62  43 - 77 %   Neutro Abs 5.2  1.7 - 7.7 K/uL   Lymphocytes Relative 23  12 - 46 %   Lymphs Abs 1.9  0.7 - 4.0 K/uL   Monocytes Relative 11  3 - 12 %   Monocytes Absolute 0.9  0.1 - 1.0 K/uL   Eosinophils Relative 3  0 - 5 %   Eosinophils Absolute 0.2  0.0 - 0.7 K/uL   Basophils Relative 1  0 - 1 %   Basophils Absolute 0.1  0.0 - 0.1 K/uL  COMPREHENSIVE METABOLIC PANEL     Status: Abnormal   Collection Time    09/13/13  5:17 PM      Result Value Ref Range   Sodium 134 (*) 137 - 147 mEq/L   Potassium 4.2  3.7 - 5.3 mEq/L   Chloride 95 (*) 96 - 112 mEq/L   CO2 26  19 - 32 mEq/L   Glucose, Bld 98  70 - 99 mg/dL   BUN 21  6 - 23 mg/dL   Creatinine, Ser 7.82  0.50 - 1.35 mg/dL   Calcium 9.0  8.4 - 95.6 mg/dL   Total Protein 7.3  6.0 - 8.3 g/dL   Albumin 4.2  3.5 - 5.2 g/dL   AST 18  0 - 37 U/L   ALT 10  0 - 53 U/L   Alkaline Phosphatase 60  39 - 117 U/L   Total Bilirubin 0.3  0.3 - 1.2 mg/dL   GFR calc non Af Amer 48 (*) >90 mL/min   GFR calc Af Amer 56 (*) >90  mL/min   Anion gap 13  5 - 15  I-STAT TROPOININ, ED     Status: None   Collection Time    09/13/13  5:34 PM      Result Value Ref Range   Troponin i, poc 0.01  0.00 - 0.08 ng/mL   Comment 3           I-STAT CHEM 8, ED     Status: Abnormal   Collection Time    09/13/13  5:36 PM      Result Value Ref Range   Sodium 134 (*) 137 - 147 mEq/L   Potassium 4.1  3.7 - 5.3 mEq/L   Chloride 97  96 - 112 mEq/L   BUN 23  6 - 23 mg/dL   Creatinine, Ser 2.13 (*) 0.50 - 1.35 mg/dL   Glucose, Bld 99  70 - 99 mg/dL   Calcium, Ion 0.86  5.78 - 1.30 mmol/L   TCO2 26  0 -  100 mmol/L   Hemoglobin 13.3  13.0 - 17.0 g/dL   HCT 46.939.0  62.939.0 - 52.852.0 %  URINALYSIS, ROUTINE W REFLEX MICROSCOPIC     Status: Abnormal   Collection Time    09/13/13  6:04 PM      Result Value Ref Range   Color, Urine YELLOW  YELLOW   APPearance CLEAR  CLEAR   Specific Gravity, Urine 1.008  1.005 - 1.030   pH 6.0  5.0 - 8.0   Glucose, UA NEGATIVE  NEGATIVE mg/dL   Hgb urine dipstick TRACE (*) NEGATIVE   Bilirubin Urine NEGATIVE  NEGATIVE   Ketones, ur NEGATIVE  NEGATIVE mg/dL   Protein, ur NEGATIVE  NEGATIVE mg/dL   Urobilinogen, UA 0.2  0.0 - 1.0 mg/dL   Nitrite NEGATIVE  NEGATIVE   Leukocytes, UA LARGE (*) NEGATIVE  URINE MICROSCOPIC-ADD ON     Status: Abnormal   Collection Time    09/13/13  6:04 PM      Result Value Ref Range   Squamous Epithelial / LPF RARE  RARE   WBC, UA 11-20  <3 WBC/hpf   RBC / HPF 0-2  <3 RBC/hpf   Bacteria, UA FEW (*) RARE   Casts HYALINE CASTS (*) NEGATIVE  MRSA PCR SCREENING     Status: None   Collection Time    09/13/13 10:46 PM      Result Value Ref Range   MRSA by PCR NEGATIVE  NEGATIVE  BASIC METABOLIC PANEL     Status: Abnormal   Collection Time    09/14/13  2:35 AM      Result Value Ref Range   Sodium 138  137 - 147 mEq/L   Potassium 4.0  3.7 - 5.3 mEq/L   Chloride 101  96 - 112 mEq/L   CO2 25  19 - 32 mEq/L   Glucose, Bld 87  70 - 99 mg/dL   BUN 19  6 - 23 mg/dL    Creatinine, Ser 4.131.16  0.50 - 1.35 mg/dL   Calcium 8.5  8.4 - 24.410.5 mg/dL   GFR calc non Af Amer 54 (*) >90 mL/min   GFR calc Af Amer 62 (*) >90 mL/min   Anion gap 12  5 - 15  CBC     Status: Abnormal   Collection Time    09/14/13  2:35 AM      Result Value Ref Range   WBC 8.3  4.0 - 10.5 K/uL   RBC 3.89 (*) 4.22 - 5.81 MIL/uL   Hemoglobin 11.4 (*) 13.0 - 17.0 g/dL   HCT 01.033.9 (*) 27.239.0 - 53.652.0 %   MCV 87.1  78.0 - 100.0 fL   MCH 29.3  26.0 - 34.0 pg   MCHC 33.6  30.0 - 36.0 g/dL   RDW 64.413.0  03.411.5 - 74.215.5 %   Platelets 174  150 - 400 K/uL  LIPID PANEL     Status: Abnormal   Collection Time    09/14/13  2:35 AM      Result Value Ref Range   Cholesterol 107  0 - 200 mg/dL   Triglycerides 48  <595<150 mg/dL   HDL 38 (*) >63>39 mg/dL   Total CHOL/HDL Ratio 2.8     VLDL 10  0 - 40 mg/dL   LDL Cholesterol 59  0 - 99 mg/dL  GLUCOSE, CAPILLARY     Status: None   Collection Time    09/14/13  4:23 AM  Result Value Ref Range   Glucose-Capillary 89  70 - 99 mg/dL   Comment 1 Documented in Chart     Comment 2 Notify RN    GLUCOSE, CAPILLARY     Status: None   Collection Time    09/14/13 10:29 AM      Result Value Ref Range   Glucose-Capillary 94  70 - 99 mg/dL  GLUCOSE, CAPILLARY     Status: None   Collection Time    09/14/13 12:09 PM      Result Value Ref Range   Glucose-Capillary 93  70 - 99 mg/dL   Comment 1 Notify RN     Comment 2 Documented in Chart      Time spent in discharge (includes decision making & examination of pt): > 30 minutes  09/14/2013, 1:53 PM   Lonia Blood, MD [facility] Office  [phone] Pager [phone]  On-Call/Text Page:      [email or web address]      password [facility]

## 2013-09-14 NOTE — Progress Notes (Signed)
Pt found trying to climb out of bed pulling on catheter. Catheter tubing had frank red blood in it. Pt stated he had to pee. Catheter balloon deflated and catheter advanced to Y conection balloon reinflated with 10cc NS. Pt was bladder scanned 0 ML was in the bladder. Catheter irrigated with 70cc of Sterile water. Lg frank red blood with clots was irrigated from bladder.  Will continue to monitor pt. Np paged

## 2013-09-14 NOTE — Progress Notes (Signed)
Back from EEG and confused, daughter states, "this is how we found him yesterday".  Dr. Amada JupiterKirkpatrick aware. Continue to watch.

## 2013-09-14 NOTE — Discharge Instructions (Signed)

## 2013-09-14 NOTE — Progress Notes (Signed)
Utilization review completed.  

## 2013-09-15 ENCOUNTER — Inpatient Hospital Stay (HOSPITAL_COMMUNITY): Payer: Medicare Other

## 2013-09-15 LAB — PHENYTOIN LEVEL, TOTAL: PHENYTOIN LVL: 16.4 ug/mL (ref 10.0–20.0)

## 2013-09-15 LAB — GLUCOSE, CAPILLARY
Glucose-Capillary: 93 mg/dL (ref 70–99)
Glucose-Capillary: 93 mg/dL (ref 70–99)
Glucose-Capillary: 118 mg/dL — ABNORMAL HIGH (ref 70–99)

## 2013-09-15 MED ORDER — SODIUM CHLORIDE 0.9 % IV SOLN
1000.0000 mg | Freq: Once | INTRAVENOUS | Status: AC
  Administered 2013-09-15: 1000 mg via INTRAVENOUS
  Filled 2013-09-15: qty 20

## 2013-09-15 MED ORDER — QUETIAPINE 12.5 MG HALF TABLET
12.5000 mg | ORAL_TABLET | Freq: Every day | ORAL | Status: DC
  Filled 2013-09-15: qty 1

## 2013-09-15 MED ORDER — LEVETIRACETAM IN NACL 500 MG/100ML IV SOLN
500.0000 mg | Freq: Once | INTRAVENOUS | Status: AC
  Administered 2013-09-15: 500 mg via INTRAVENOUS
  Filled 2013-09-15: qty 100

## 2013-09-15 MED ORDER — LEVETIRACETAM IN NACL 1000 MG/100ML IV SOLN
1000.0000 mg | Freq: Two times a day (BID) | INTRAVENOUS | Status: DC
  Administered 2013-09-15 – 2013-09-17 (×4): 1000 mg via INTRAVENOUS
  Filled 2013-09-15 (×5): qty 100

## 2013-09-15 MED ORDER — PHENYTOIN SODIUM EXTENDED 100 MG PO CAPS
300.0000 mg | ORAL_CAPSULE | Freq: Every day | ORAL | Status: DC
Start: 2013-09-15 — End: 2013-09-18
  Administered 2013-09-15 – 2013-09-17 (×3): 300 mg via ORAL
  Filled 2013-09-15 (×4): qty 3

## 2013-09-15 MED ORDER — LORAZEPAM 2 MG/ML IJ SOLN
2.0000 mg | Freq: Once | INTRAMUSCULAR | Status: AC
  Administered 2013-09-15: 2 mg via INTRAVENOUS
  Filled 2013-09-15: qty 1

## 2013-09-15 NOTE — Progress Notes (Signed)
Subjective: Patient became acutely confused during EEG with right frontal seizures electrographically. Bettr this AM.  Exam: Filed Vitals:   09/15/13 0440  BP: 112/59  Pulse: 60  Temp: 98.5 F (36.9 C)  Resp: 21   Gen: In bed, NAD MS: awake. Alert, oriented to place, year.  ZD:GUYQICN:PERRL, EOMI, VFF with no visual extinction.  Motor: 5/5 throuhgout  Sensory:intact to LT, no extinction.   CT head - negative  Impression: 80 yo M presenting with seizures. He had a clear change in MS yesterday with seizures on EEG.   Recommendations: 1) Continue keppra 1gm BID 2) repeat EEG today.  3) will likely need to observe for continued improvement.   Ritta SlotMcNeill Alena Blankenbeckler, MD [facility] [phone]  If 7pm- 7am, please page neurology on call as listed in AMION.   Addendum: pt had two seizures while connected to EEG. Given 1mg  IV ativan and loaded with keppra and increased to 1gm BID.   Ritta SlotMcNeill Shin Lamour, MD [facility] [phone]  If 7pm- 7am, please page neurology on call as listed in AMION.

## 2013-09-15 NOTE — Progress Notes (Addendum)
[facility] TEAM 1 - Stepdown/ICU TEAM Progress Note  RAJENDRA SPILLER ZOX:[id number] DOB: 1921-08-25 DOA: 09/13/2013 PCP: Beverley Fiedler, MD  Admit HPI / Brief Narrative: 80 y.o. male with a history of chronic systolic heart failure, coronary artery disease, status post aortic replacement by prosthetic valve, permanent pacemaker implantation who presented today with confusion. Patient went to answer the phone, then his spouse noted that the patient was just standing there with the phone in his right hand and was somewhat confused. She also noticed some mild drooling and left lip twitching. He never lost consciousness, he was able to follow some commands, she made him sit on the chair and called 911.  EMS reported episodes of forced gaze deviation with episodes of confusion. He was subsequently evaluated in the emergency room, neurology consultation was obtained, he was noted to have a left facial droop, and mild left-sided weakness and neglect. CT of the head done in the emergency room was negative for acute abnormalities. Patient was suspected to have had an acute CVA versus seizure with Todd's paresis.   Significant events:  8/7 - suffered 3 repeat seizures during EEG testing   HPI/Subjective: Yesterday's d/c was cancelled after pt developed recurrent seizure activity during EEG testing. Keppra dose increased per Neuro.  Alert but significantly confused at present.    Assessment/Plan:  Seizure with Todd's Paresis  CT head negative for acute abnormalities - unable to do MRI because of pacemaker - loaded with Keppra in the emergency room - rapidly improved, w/ return to basline MS - EEG confirms seizure activity - to continue AED, but dose increased after pt had recurrent seizures during EEG 8/7 - monitor on higher dose 48hrs or so  Encephalopathy acute  secondary to post ictal state + ativan + sundowning - transfer out of SDU - minimize sedatives (no more ativan unless seizing) - consider trial  of QHS seroquel only while hospitalized if persists   Ischemic cardiomyopathy / chronic systolic heart failure + grade 3 diastolic CHF  Compensated clinically - to f/u with Dr. Myrtis Ser for pre-op clearance eval prior to BPH surgery   Hyperlipidemia  No change in tx plan   Essential hypertension, benign  controlled   BPH with urinary retention  Admit w/ foley in place - scheduled for a urologic procedure in the next few days - UA c/w UTI, but findings could be due to colonization related to urinary catheter, and in absence of sx, no fever, and normal WBC, will not risk abx tx (as most abx lower the seizure threshold) - given current confusion, will recheck UA w/ culture and consider tx if cx positive   PACEMAKER, PERMANENT   S/P aortic valve replacement with bioprosthetic valve  No clinical evidence of valve dysfxn  Code Status: NO CODE  Family Communication: spoke w/ pt, wife, and daughter at bedside at length Disposition Plan: transfer to neuro bed - non tele - possible d/c home Monday if no further sz activity and mental status stabilizes   Consultants: Neuro   Procedures: EEG x2  Antibiotics: none  DVT prophylaxis: SQ heparin   Objective: Blood pressure 101/46, pulse 65, temperature 97.6 F (36.4 C), temperature source Axillary, resp. rate 17, height 5\' 7"  (1.702 m), weight 63.1 kg (139 lb 1.8 oz), SpO2 100.00%.  Intake/Output Summary (Last 24 hours) at 09/15/13 1319 Last data filed at 09/15/13 1200  Gross per 24 hour  Intake    545 ml  Output   1550 ml  Net  -1005  ml   Exam: General: No acute respiratory distress Lungs: Clear to auscultation bilaterally without wheezes or crackles Cardiovascular: Regular rate and rhythm without murmur gallop or rub normal S1 and S2 Abdomen: Nontender, nondistended, soft, bowel sounds positive, no rebound, no ascites, no appreciable mass Extremities: No significant cyanosis, clubbing, or edema bilateral lower extremities  Data  Reviewed: Basic Metabolic Panel:  Recent Labs Lab 09/13/13 1717 09/13/13 1736 09/14/13 0235  NA 134* 134* 138  K 4.2 4.1 4.0  CL 95* 97 101  CO2 26  --  25  GLUCOSE 98 99 87  BUN 21 23 19   CREATININE 1.27 1.40* 1.16  CALCIUM 9.0  --  8.5   Liver Function Tests:  Recent Labs Lab 09/13/13 1717  AST 18  ALT 10  ALKPHOS 60  BILITOT 0.3  PROT 7.3  ALBUMIN 4.2   Coags:  Recent Labs Lab 09/13/13 1717  INR 1.11   No results found for this basename: PTT,  in the last 168 hours  CBC:  Recent Labs Lab 09/13/13 1717 09/13/13 1736 09/14/13 0235  WBC 8.3  --  8.3  NEUTROABS 5.2  --   --   HGB 12.4* 13.3 11.4*  HCT 37.9* 39.0 33.9*  MCV 87.9  --  87.1  PLT 187  --  174   CBG:  Recent Labs Lab 09/14/13 1945 09/14/13 2357 09/15/13 0439 09/15/13 0830 09/15/13 1216  GLUCAP 94 120* 93 93 118*    Recent Results (from the past 240 hour(s))  MRSA PCR SCREENING     Status: None   Collection Time    09/13/13 10:46 PM      Result Value Ref Range Status   MRSA by PCR NEGATIVE  NEGATIVE Final   Comment:            The GeneXpert MRSA Assay (FDA     approved for NASAL specimens     only), is one component of a     comprehensive MRSA colonization     surveillance program. It is not     intended to diagnose MRSA     infection nor to guide or     monitor treatment for     MRSA infections.    Studies:  Recent x-ray studies have been reviewed in detail by the Attending Physician  Scheduled Meds:  Scheduled Meds: . aspirin EC  81 mg Oral QHS  . carvedilol  12.5 mg Oral BID WC  . dutasteride  0.5 mg Oral QPM  . furosemide  60 mg Oral Daily  . haloperidol lactate  5 mg Intravenous Once  . heparin  5,000 Units Subcutaneous 3 times per day  . levETIRAcetam  1,000 mg Intravenous Q12H  . loratadine  10 mg Oral Daily  . multivitamin with minerals  1 tablet Oral Daily  . pantoprazole  40 mg Oral Daily  . phenytoin  300 mg Oral QHS  . potassium chloride SA  10  mEq Oral Daily  . ramipril  10 mg Oral QHS  . simvastatin  40 mg Oral QHS  . sodium chloride  3 mL Intravenous Q12H  . sodium chloride  3 mL Intravenous Q12H  . tamsulosin  0.8 mg Oral QHS    Time spent on care of this patient: 35 mins   Maida Widger T , MD   [facility] Office  [phone] Pager - Text Page per Loretha StaplerAmion as per below:  On-Call/Text Page:      [email or web address]  password [email or web address]  If 7PM-7AM, please contact night-coverage [email or web address] Password [email or web address] 09/15/2013, 1:19 PM   LOS: 2 days

## 2013-09-15 NOTE — Procedures (Signed)
History: 80 yo M with altered mental status  Sedation: None  Technique: This is a 17 channel routine scalp EEG performed at the bedside with bipolar and monopolar montages arranged in accordance to the international 10/20 system of electrode placement. One channel was dedicated to EKG recording.    Background: There is a well defined posterior dominant rhythm of 8 Hz that attenuates with eye opening. There are frequent right frontal sharp waves, sometimes with accompanying slow waves. There is a 3 minute run of evolving activity consistent with a seizure arising from the right frontal region(initially seen at F4, F8).   Photic stimulation: Physiologic driving is not performed  EEG Abnormalities: 1) Single 3 minute long seizure arising from teh right frontal region. 2) Frequent right frontal sharp activity  Clinical Interpretation: This EEg recorded a single right frontal seizure lasting 3 minutes.   Bill SlotMcNeill Gabbrielle Mcnicholas, MD [facility] [phone]  If 7pm- 7am, please page neurology on call as listed in AMION.

## 2013-09-15 NOTE — Progress Notes (Signed)
Bedside EEG completed, results pending. 

## 2013-09-16 DIAGNOSIS — J449 Chronic obstructive pulmonary disease, unspecified: Secondary | ICD-10-CM

## 2013-09-16 NOTE — Progress Notes (Signed)
Occupational Therapy Evaluation Patient Details Name: Bill Cunningham MRN: [id number] DOB: 02/23/1921 Today's Date: 09/16/2013    History of Present Illness Bill Cunningham. Bill Cunningham") is an 80 y.o. Male admitted 09/13/13 with c/o confusion, mild drooling, and left face/lip twitching.  CT was negative for acute abnormalities and MRI unable to be performed due to pacemaker. Pt with PMH of chronic systolic heart failure, CAD, status post aortic replacement by prosthetic valve, and permanent pacemaker. Bedside EEGs performed on 8/7 and 8/8 recorded right frontal seizures.   Clinical Impression   PTA pt lived at home and was independent with ADLs and functional mobility. Pt has had urethral catheter about 5 months (per daughter) and has been sponge bathing since insertion. Pt is highly anticipative of surgery to remove catheter and is disappointed that his seizures may delay his surgery. Pt more alert and oriented with appropriate conversation this date. Min (A) for ambulation to bathroom and back with use of RW due to occasional LOB. Pt desires independence and to return to his garden. Pt would benefit from continued skilled OT to address safety with ADLs and functional mobility.     Follow Up Recommendations  Home health OT;Supervision/Assistance - 24 hour    Equipment Recommendations  Tub/shower seat       Precautions / Restrictions Precautions Precautions: Fall Restrictions Weight Bearing Restrictions: No      Mobility Bed Mobility Overal bed mobility: Modified Independent                Transfers Overall transfer level: Needs assistance Equipment used: None;Rolling walker (2 wheeled) Transfers: Sit to/from Stand Sit to Stand: Min assist         General transfer comment: Min (A) for stability with/out RW. Pt demonstrates posterior lean in standing    Balance Overall balance assessment: Needs assistance Sitting-balance support: No upper extremity supported;Feet  supported Sitting balance-Leahy Scale: Good     Standing balance support: Bilateral upper extremity supported;During functional activity Standing balance-Leahy Scale: Poor                              ADL Overall ADL's : Needs assistance/impaired Eating/Feeding: Independent;Sitting   Grooming: Min guard;Standing;Wash/dry hands   Upper Body Bathing: Set up;Sitting   Lower Body Bathing: Sit to/from stand;Min guard   Upper Body Dressing : Set up;Sitting   Lower Body Dressing: Min guard;Sit to/from stand   Toilet Transfer: Minimal assistance;Ambulation;RW           Functional mobility during ADLs: Minimal assistance;Rolling walker ((A) due to LOB at times with RW) General ADL Comments: Pt appears to be more oriented and alert today and is able to carry on appropriate conversation. Pt had appropriate questions relating to his seizures and what they are and what caused them. Talked to pt about seizures and their effects on the body and importance of therapy to strengthen muscles and improve balance. Pt ambulated to bathroom with Min (A) using RW due to slight LOB at times. Pt demonstrates posterior lean in standing.      Vision  Pt wears glasses (bifocals) at all times.                Additional Comments: +nystagmus. No other apparent visual deficits. Denies diplopia and blurriness. Negative dix-halpike. To be further assessed.    Perception Perception Perception Tested?: No   Praxis Praxis Praxis tested?: Within functional limits    Pertinent Vitals/Pain Pain Assessment: No/denies  pain     Hand Dominance Right   Extremity/Trunk Assessment Upper Extremity Assessment Upper Extremity Assessment: Overall WFL for tasks assessed (MMT 5/5 for Bil UEs)   Lower Extremity Assessment Lower Extremity Assessment: Defer to PT evaluation   Cervical / Trunk Assessment Cervical / Trunk Assessment: Normal   Communication Communication Communication: HOH    Cognition Arousal/Alertness: Awake/alert Behavior During Therapy: WFL for tasks assessed/performed;Impulsive Overall Cognitive Status: Within Functional Limits for tasks assessed                 General Comments: Pt was alert and oriented. Gave pt multistep commands and pt was able to accurately follow and performed appropriate sequencing (hand washing). HOH is limiting as pt appears to be more confused than he is due to difficulty hearing commands. Mild impulsivity.               Home Living Family/patient expects to be discharged to:: Private residence Living Arrangements: Spouse/significant other Available Help at Discharge: Family Type of Home: House Home Access: Stairs to enter Secretary/administratorntrance Stairs-Number of Steps: 3 Entrance Stairs-Rails: Right Home Layout: One level     Bathroom Shower/Tub: Tub/shower unit Shower/tub characteristics: Engineer, building servicesCurtain Bathroom Toilet: Standard     Home Equipment: None          Prior Functioning/Environment Level of Independence: Independent        Comments: Pt's daughter reports that pt has been sponge bathing since his catheter insertion about 5 months ago. Pt is highly anticipative of surgery to remove catheter and would like to return to taking showers. Pt is active in his garden    OT Diagnosis: Generalized weakness;Cognitive deficits   OT Problem List: Decreased strength;Decreased range of motion;Decreased activity tolerance;Impaired balance (sitting and/or standing);Decreased safety awareness;Decreased knowledge of use of DME or AE;Decreased knowledge of precautions   OT Treatment/Interventions:      OT Goals(Current goals can be found in the care plan section) Acute Rehab OT Goals Patient Stated Goal: to go back to my garden OT Goal Formulation: With patient/family Time For Goal Achievement: 09/30/13 Potential to Achieve Goals: Good ADL Goals Pt Will Perform Grooming: with modified independence;standing Pt Will Perform  Lower Body Bathing: with modified independence;sit to/from stand Pt Will Perform Lower Body Dressing: with modified independence;sit to/from stand Pt Will Transfer to Toilet: with modified independence;ambulating;regular height toilet Pt Will Perform Tub/Shower Transfer: with modified independence;ambulating;shower seat;rolling walker  OT Frequency: Min 2X/week   Barriers to D/C: Other (comment)  pt's wife is not physically able to assist pt          End of Session Equipment Utilized During Treatment: Rolling walker;Gait belt Nurse Communication: Mobility status  Activity Tolerance: Patient tolerated treatment well Patient left: in chair;with call bell/phone within reach;with family/visitor present;with nursing/sitter in room   Time: [id number] OT Time Calculation (min): 37 min Charges:  OT General Charges $OT Visit: 1 Procedure OT Evaluation $Initial OT Evaluation Tier I: 1 Procedure OT Treatments $Self Care/Home Management : 23-37 mins  Rae LipsMiller, Providencia Hottenstein [phone] 09/16/2013, 3:19 PM

## 2013-09-16 NOTE — Progress Notes (Signed)
[facility] PROGRESS NOTE Interim History: 80 y.o. male with a history of chronic systolic heart failure, coronary artery disease, status post aortic replacement by prosthetic valve, permanent pacemaker implantation who presented today with confusion. Patient went to answer the phone, then his spouse noted that the patient was just standing there with the phone in his right hand and was somewhat confused. She also noticed some mild drooling and left lip twitching. He never lost consciousness, he was able to follow some commands, she made him sit on the chair and called 911.  EMS reported episodes of forced gaze deviation with episodes of confusion. He was subsequently evaluated in the emergency room, neurology consultation was obtained, he was noted to have a left facial droop, and mild left-sided weakness and neglect. CT of the head done in the emergency room was negative for acute abnormalities. Patient was suspected to have had an acute CVA versus seizure with Todd's paresis.   Assessment/Plan: Seizure with Todd's Paresis  - CT head negative for acute abnormalities  - Could not perform MRI because of pacemaker. - Loaded with Keppra in the emergency room - rapidly improved, w/ return to basline MS  - EEG confirms seizure activity - to continue AED, but dose increased after pt had recurrent seizures during EEG 8/7  - home ina am.  Encephalopathy acute  - Multifactorial due to post ictal state + ativan + sundowning - transfer out of SDU   Ischemic cardiomyopathy / chronic systolic heart failure + grade 3 diastolic CHF  - Compensated clinically - to f/u with Dr. Myrtis Ser for pre-op clearance eval prior to BPH surgery    BPH with urinary retention  - Admit w/ foley in place - scheduled for a urologic procedure in the next few days  - UA c/w UTI, but findings could be due to colonization related to urinary catheter, no fever no leukocytosis, no antibiotics at this time. - Given current  confusion, will recheck UA w/ culture and consider tx if cx positive   PACEMAKER, PERMANENT   S/P aortic valve replacement with bioprosthetic valve  No clinical evidence of valve dysfxn  Code Status: NO CODE  Family Communication: spoke w/ pt, wife, and daughter at bedside at length  Disposition Plan: possible d/c home Monday.   Consultants:  neuro  Procedures:  CT head  EEG  Antibiotics:  None  HPI/Subjective: Sedated.  Objective: Filed Vitals:   09/15/13 1634 09/15/13 2157 09/16/13 0106 09/16/13 0535  BP: 115/53 110/82 113/57 91/42  Pulse: 64 83 80 63  Temp: 98.4 F (36.9 C) 97.8 F (36.6 C) 98.2 F (36.8 C) 97.4 F (36.3 C)  TempSrc: Oral Oral Oral Oral  Resp: 16 18 18 18   Height:      Weight:      SpO2: 99% 97% 93% 94%    Intake/Output Summary (Last 24 hours) at 09/16/13 0752 Last data filed at 09/16/13 1610  Gross per 24 hour  Intake    445 ml  Output   1325 ml  Net   -880 ml   Filed Weights   09/13/13 1732 09/13/13 2248  Weight: 65.318 kg (144 lb) 63.1 kg (139 lb 1.8 oz)    Exam:  General: Alert, awake, oriented x1, in no acute distress.  HEENT: No bruits, no goiter.  Heart: Regular rate and rhythm. Lungs: Good air movement, clear. Abdomen: Soft, nontender, nondistended, positive bowel sounds.    Data Reviewed: Basic Metabolic Panel:  Recent Labs Lab 09/13/13 1717 09/13/13  1736 09/14/13 0235  NA 134* 134* 138  K 4.2 4.1 4.0  CL 95* 97 101  CO2 26  --  25  GLUCOSE 98 99 87  BUN 21 23 19   CREATININE 1.27 1.40* 1.16  CALCIUM 9.0  --  8.5   Liver Function Tests:  Recent Labs Lab 09/13/13 1717  AST 18  ALT 10  ALKPHOS 60  BILITOT 0.3  PROT 7.3  ALBUMIN 4.2   No results found for this basename: LIPASE, AMYLASE,  in the last 168 hours No results found for this basename: AMMONIA,  in the last 168 hours CBC:  Recent Labs Lab 09/13/13 1717 09/13/13 1736 09/14/13 0235  WBC 8.3  --  8.3  NEUTROABS 5.2  --   --   HGB  12.4* 13.3 11.4*  HCT 37.9* 39.0 33.9*  MCV 87.9  --  87.1  PLT 187  --  174   Cardiac Enzymes: No results found for this basename: CKTOTAL, CKMB, CKMBINDEX, TROPONINI,  in the last 168 hours BNP (last 3 results)  Recent Labs  07/08/13 0945 08/29/13 0649  PROBNP 9136.0* 8114.0*   CBG:  Recent Labs Lab 09/14/13 1945 09/14/13 2357 09/15/13 0439 09/15/13 0830 09/15/13 1216  GLUCAP 94 120* 93 93 118*    Recent Results (from the past 240 hour(s))  MRSA PCR SCREENING     Status: None   Collection Time    09/13/13 10:46 PM      Result Value Ref Range Status   MRSA by PCR NEGATIVE  NEGATIVE Final   Comment:            The GeneXpert MRSA Assay (FDA     approved for NASAL specimens     only), is one component of a     comprehensive MRSA colonization     surveillance program. It is not     intended to diagnose MRSA     infection nor to guide or     monitor treatment for     MRSA infections.     Studies: Dg Chest 2 View  09/14/2013   CLINICAL DATA:  Seizure.  EXAM: CHEST  2 VIEW  COMPARISON:  08/29/2013  FINDINGS: Left chest wall pacer device is noted. The patient is status post median sternotomy and CABG procedure. Heart size and mediastinal contours are normal. Coarsened interstitial markings are identified bilaterally. Calcified pleural plaques are noted bilaterally. No airspace consolidation. The visualized skeletal structures are unremarkable.  IMPRESSION: 1. No acute cardiopulmonary abnormalities.   Electronically Signed   By: Signa Kellaylor  Stroud M.D.   On: 09/14/2013 09:07   Ct Head Wo Contrast  09/14/2013   CLINICAL DATA:  Left-sided weakness and confusion. Evaluate for evolving infarct.  EXAM: CT HEAD WITHOUT CONTRAST  TECHNIQUE: Contiguous axial images were obtained from the base of the skull through the vertex without intravenous contrast.  COMPARISON:  09/13/2013  FINDINGS: There is no evidence of acute cortical infarct, intracranial hemorrhage, mass, midline shift, or  extra-axial fluid collection. Moderate cerebral atrophy is unchanged. Patchy periventricular white matter hypodensities are unchanged and nonspecific but compatible with mild chronic small vessel ischemic disease.  Prior bilateral cataract extraction is noted. Mastoid air cells are clear. Minimal right greater than left maxillary sinus mucosal thickening is noted. Moderate carotid siphon calcification is present.  IMPRESSION: No evidence of acute intracranial abnormality. Mild chronic small vessel ischemic disease.   Electronically Signed   By: Sebastian AcheAllen  Grady   On: 09/14/2013 08:29  Scheduled Meds: . aspirin EC  81 mg Oral QHS  . carvedilol  12.5 mg Oral BID WC  . dutasteride  0.5 mg Oral QPM  . furosemide  60 mg Oral Daily  . heparin  5,000 Units Subcutaneous 3 times per day  . levETIRAcetam  1,000 mg Intravenous Q12H  . loratadine  10 mg Oral Daily  . multivitamin with minerals  1 tablet Oral Daily  . pantoprazole  40 mg Oral Daily  . phenytoin  300 mg Oral QHS  . potassium chloride SA  10 mEq Oral Daily  . ramipril  10 mg Oral QHS  . simvastatin  40 mg Oral QHS  . tamsulosin  0.8 mg Oral QHS   Continuous Infusions:    Marinda Elk  [facility] Pager [phone]. If 8PM-8AM, please contact night-coverage at [email or web address], password [facility] 09/16/2013, 7:52 AM  LOS: 3 days      **Disclaimer: This note may have been dictated with voice recognition software. Similar sounding words can inadvertently be transcribed and this note may contain transcription errors which may not have been corrected upon publication of note.**

## 2013-09-16 NOTE — Progress Notes (Signed)
Subjective: Per family, he was sleepy from the Ativan this morning, but now has awakened considerably.  Exam: Filed Vitals:   09/16/13 1000  BP: 100/52  Pulse: 65  Temp: 98.2 F (36.8 C)  Resp: 18   Gen: In bed, NAD MS: awake. Alert, oriented to place, year.  ZO:XWRUECN:PERRL, EOMI, VFF with no visual extinction.  Motor: 5/5 throuhgout  Sensory:intact to LT, no extinction.   CT head - negative  Impression: 80 yo M presenting with seizures. Given the frequency that are seen on EEG, I would favor continued therapy and repeating an EEG again tomorrow. His mental status, however, appears to be much improved compared to yesterday when I saw him.  Recommendations: 1) Continue keppra 1gm BID 2) repeat EEG [place] 3) Dilantin 300 mg each bedtime  Ritta SlotMcNeill Chrissa Meetze, MD [facility] [phone]  If 7pm- 7am, please page neurology on call as listed in AMION.   Addendum: pt had two seizures while connected to EEG. Given 1mg  IV ativan and loaded with keppra and increased to 1gm BID.   Ritta SlotMcNeill Liban Guedes, MD [facility] [phone]  If 7pm- 7am, please page neurology on call as listed in AMION.

## 2013-09-17 ENCOUNTER — Inpatient Hospital Stay (HOSPITAL_COMMUNITY): Payer: Medicare Other

## 2013-09-17 DIAGNOSIS — N183 Chronic kidney disease, stage 3 unspecified: Secondary | ICD-10-CM

## 2013-09-17 LAB — PHENYTOIN LEVEL, TOTAL: PHENYTOIN LVL: 13.8 ug/mL (ref 10.0–20.0)

## 2013-09-17 MED ORDER — LEVETIRACETAM IN NACL 1500 MG/100ML IV SOLN
1500.0000 mg | Freq: Two times a day (BID) | INTRAVENOUS | Status: DC
  Administered 2013-09-17 – 2013-09-18 (×2): 1500 mg via INTRAVENOUS
  Filled 2013-09-17 (×3): qty 100

## 2013-09-17 NOTE — Progress Notes (Signed)
Physical Therapy Treatment Patient Details Name: Bill Cunningham R Dulworth MRN: [id number] DOB: August 27, 1921 Today's Date: 09/17/2013    History of Present Illness Bill FruitGeorge R. Bill Cunningham is an 80 y.o. Male admitted 09/13/13 with c/o confusion, mild drooling, and left face/lip twitching.  CT was negative for acute abnormalities and MRI unable to be performed due to pacemaker. Pt with PMH of chronic systolic heart failure, CAD, status post aortic replacement by prosthetic valve, and permanent pacemaker. Bedside EEGs performed on 8/7 and 8/8 recorded right frontal seizures.    PT Comments    Pt is in bed and asking about going home today.  Sitter in and family afterward, all able to observe pt in session with PT both walking and with exercise for family.  Pt and family asking about discharge today but PT unable to confirm, nsg sent in to answer questions about final plan.  Pt unsafe to walk alone and will need 24/7 supervision.  Follow Up Recommendations        Equipment Recommendations       Recommendations for Other Services       Precautions / Restrictions Precautions Precautions: Fall;ICD/Pacemaker Restrictions Weight Bearing Restrictions: No    Mobility  Bed Mobility Overal bed mobility: Modified Independent Bed Mobility: Supine to Sit     Supine to sit: Supervision     General bed mobility comments: Slow to sequence and needed reminding for lines but also hand placement and to slow impulsive standing  Transfers Overall transfer level: Needs assistance Equipment used: Rolling walker (2 wheeled) Transfers: Sit to/from Stand Sit to Stand: Min guard         General transfer comment: Min guarding with posterior lean but tends to stand with incorrect placement of hands, impulsive  Ambulation/Gait Ambulation/Gait assistance: Min guard Ambulation Distance (Feet): 120 Feet Assistive device: Rolling walker (2 wheeled) Gait Pattern/deviations: Step-through pattern;Wide base of  support;Ataxic Gait velocity: slower but needs control for obstacles Gait velocity interpretation: Below normal speed for age/gender General Gait Details: Assist for stability and safety with navigation   Stairs            Wheelchair Mobility    Modified Rankin (Stroke Patients Only) Modified Rankin (Stroke Patients Only) Pre-Morbid Rankin Score: No symptoms Modified Rankin: Moderate disability     Balance Overall balance assessment: Needs assistance Sitting-balance support: Feet supported Sitting balance-Leahy Scale: Good   Postural control: Posterior lean Standing balance support: Bilateral upper extremity supported Standing balance-Leahy Scale: Poor                      Cognition Arousal/Alertness: Awake/alert Behavior During Therapy: WFL for tasks assessed/performed Overall Cognitive Status: Within Functional Limits for tasks assessed         Following Commands: Follows one step commands consistently;Follows multi-step commands inconsistently Safety/Judgement: Decreased awareness of safety;Decreased awareness of deficits Awareness: Emergent Problem Solving: Slow processing;Difficulty sequencing;Requires verbal cues;Requires tactile cues General Comments: Family in and provided pt with hearing aids but he is asking questions repetitively, cues for safety were repeated    Exercises General Exercises - Lower Extremity Gluteal Sets: Both;10 reps;Standing Hip Flexion/Marching: Both;15 reps Toe Raises: Both;15 reps    General Comments General comments (skin integrity, edema, etc.): family present and wife concerned about managing pt on her own at home as daughter is leaving to go to her home.      Pertinent Vitals/Pain Pain Assessment: No/denies pain.  BP was 104/57, pulse 69 and O2 sat 98 to 99% before/after gait.  Home Living                      Prior Function            PT Goals (current goals can now be found in the care plan  section) Acute Rehab PT Goals Patient Stated Goal: Go home and leave today  Progress towards PT goals: Progressing toward goals    Frequency       PT Plan      Co-evaluation             End of Session Equipment Utilized During Treatment: Gait belt Activity Tolerance: Patient tolerated treatment well Patient left: in chair;with call bell/phone within reach;with family/visitor present;with nursing/sitter in room     Time: 7425-9563 PT Time Calculation (min): 35 min  Charges:  $Gait Training: 8-22 mins $Therapeutic Exercise: 8-22 mins                    G Codes:      Ivar Drape October 04, 2013, 10:52 AM  Samul Dada, PT MS Acute Rehab Dept. Number: [phone]

## 2013-09-17 NOTE — Progress Notes (Signed)
Occupational Therapy Treatment Patient Details Name: Bill Cunningham MRN: [id number] DOB: 13-Dec-1921 Today's Date: 09/17/2013    History of present illness Bill Cunningham is an 80 y.o. Male admitted 09/13/13 with c/o confusion, mild drooling, and left face/lip twitching.  CT was negative for acute abnormalities and MRI unable to be performed due to pacemaker. Pt with PMH of chronic systolic heart failure, CAD, status post aortic replacement by prosthetic valve, and permanent pacemaker. Bedside EEGs performed on 8/7 and 8/8 recorded right frontal seizures.   OT comments  Pt demonstrates impaired memory and decreased problem solving during BADLs.  Pt brushed teeth and required 5 attempts to put his partials in correctly.  He placed the bottom on the top, and the top on the bottom, removed them, repeated the error, removed them, put them in backwards, then upside down. He demonstrates poor safety awareness.  Unsure that family understands the level of supervision he will require at discharge.   Explained deficits to them, but daughter seems to downplay the significance ("he's had a good day.  He sat up all day.  He's at baseline")  He clearly is not at baseline as he was independent in the community with driving, with IADLs.  She states that pt's wife will be providing 24 hour assistance, and she is going to stay for a bit to make sure he gets settled then go home [employer]([place]) and "HHPT will come".  Explained he needs assist at all times when he is up, they nodded, but then made reference to his desire to get back to gardening.    Follow Up Recommendations  Home health OT;Supervision/Assistance - 24 hour    Equipment Recommendations  Tub/shower seat    Recommendations for Other Services      Precautions / Restrictions Precautions Precautions: Fall;ICD/Pacemaker       Mobility Bed Mobility                  Transfers Overall transfer level: Needs assistance Equipment used: Rolling  walker (2 wheeled) Transfers: Sit to/from Stand;Stand Pivot Transfers Sit to Stand: Min guard Stand pivot transfers: Min guard            Balance Overall balance assessment: Needs assistance   Sitting balance-Leahy Scale: Good     Standing balance support: During functional activity Standing balance-Leahy Scale: Fair                     ADL       Grooming: Wash/dry hands;Oral care;Min guard (mod verbal cues)               Lower Body Dressing: Min guard;Sit to/from stand   Toilet Transfer: Minimal assistance;Ambulation;RW           Functional mobility during ADLs: Min guard;Rolling walker        Vision                     Perception     Praxis      Cognition   Behavior During Therapy: WFL for tasks assessed/performed Overall Cognitive Status: Impaired/Different from baseline Area of Impairment: Attention;Memory;Safety/judgement;Problem solving;Awareness   Current Attention Level: Selective Memory: Decreased short-term memory  Following Commands: Follows one step commands consistently Safety/Judgement: Decreased awareness of safety;Decreased awareness of deficits   Problem Solving: Slow processing;Difficulty sequencing;Requires verbal cues;Requires tactile cues General Comments: Pt with poor memory of what has occurred during hosptilization asking multiple questions, then becoming argumentative because he doesn't remember  those things occurring.  While brushing his teeth, he reversed his partials several times putting the top on the bottom and bottom on the top.  He would remove the partial to correct the error, then put the partial in backwards - required 5 attempts to get it corrected.  Min verbal cues for sequencing.     Extremity/Trunk Assessment               Exercises     Shoulder Instructions       General Comments      Pertinent Vitals/ Pain       Pain Assessment: No/denies pain  Home Living                                           Prior Functioning/Environment              Frequency Min 2X/week     Progress Toward Goals  OT Goals(current goals can now be found in the care plan section)  Progress towards OT goals: Progressing toward goals  ADL Goals Pt Will Perform Grooming: with modified independence;standing Pt Will Perform Lower Body Bathing: with modified independence;sit to/from stand Pt Will Perform Lower Body Dressing: with modified independence;sit to/from stand Pt Will Transfer to Toilet: with modified independence;ambulating;regular height toilet Pt Will Perform Tub/Shower Transfer: with modified independence;ambulating;shower seat;rolling walker  Plan Discharge plan remains appropriate    Co-evaluation                 End of Session Equipment Utilized During Treatment: Rolling walker   Activity Tolerance Patient tolerated treatment well   Patient Left in chair;with call bell/phone within reach;with family/visitor present   Nurse Communication Mobility status        Time: 1610-9604 OT Time Calculation (min): 19 min  Charges: OT General Charges $OT Visit: 1 Procedure OT Treatments $Self Care/Home Management : 8-22 mins  Laylana Gerwig M 09/17/2013, 6:32 PM

## 2013-09-17 NOTE — Progress Notes (Signed)
UR complete.  Ankith Edmonston RN, MSN 

## 2013-09-17 NOTE — Procedures (Signed)
ELECTROENCEPHALOGRAM REPORT  Date of Study: 09/17/2013  Patient's Name: Bill Cunningham MRN: [id number] Date of Birth: 08-17-1921  Referring Provider: Dr. Ritta SlotMcNeill Kirkpatrick  Clinical History: This is an 80 year old manner with altered mental status who presented with seizures, mental status is improved. He was having focal right frontal seizures  Medications: Keppra, Dilantin  Technical Summary: A multichannel digital EEG recording measured by the international 10-20 system with electrodes applied with paste and impedances below 5000 ohms performed in our laboratory with EKG monitoring in an awake and drowsy patient.  Hyperventilation and photic stimulation were not performed.  The digital EEG was referentially recorded, reformatted, and digitally filtered in a variety of bipolar and referential montages for optimal display.   Description: The patient is awake and drowsy during the recording.  During maximal wakefulness, there is a symmetric, medium voltage 8.5 Hz posterior dominant rhythm that attenuates with eye opening.  There are frequent 1-7 second runs of 2-3 Hz delta slowing seen over the right hemisphere with intermixed low amplitude right frontal sharp waves that do not evolve in frequency or amplitude.  During drowsiness, there is an increase in theta slowing of the background.  Hyperventilation and photic stimulation were not performed. There were no electrographic seizures seen in this study.  EKG lead was unremarkable.  Impression: This awake and drowsy EEG is abnormal due to the presence of: 1. Focal slowing over the right hemisphere 2. Frequent sharp waves over the right frontal region  Clinical Correlation of the above findings indicates focal cerebral dysfunction over the right hemisphere with possible epileptogenic potential over the right frontal region.  There were no electrographic seizures seen in this study.     Patrcia DollyKaren Aquino, M.D.

## 2013-09-17 NOTE — Progress Notes (Signed)
Speech Language Pathology Treatment: Dysphagia  Patient Details Name: Bill Cunningham MRN: [id number] DOB: 11/26/1921 Today's Date: 09/17/2013 Time: 1434-1444 SLP Time Calculation (min): 10 min  Assessment / Plan / Recommendation Clinical Impression  SLP provided skilled observation of regular textures and thin liquids via straw sips for assessment of diet tolerance. Pt consumed PO trials with no cueing needed for utilization of safe swallowing strategies, with no overt s/s of aspiration noted. Despite adequate mastication and bolus formation time, pt reported a preference for softer foods PTA to facilitate mastication with partials. Family present reports that he appears to have returned to his baseline. Recommend to continue Dys 3 textures and thin liquids per patient preference. No further SLP f/u for swallowing is warranted at this time.   HPI HPI: 80 y.o. male with a PMH of CHF, CAD, status post aortic replacement by prosthetic valve, permanent pacemaker implantation who presents today with the above noted complaint. Please note, patient still is somewhat confused and encephalopathic.   Pertinent Vitals Pain Assessment: No/denies pain  SLP Plan  All goals met    Recommendations Diet recommendations: Dysphagia 3 (mechanical soft);Thin liquid Liquids provided via: Cup;Straw Medication Administration: Whole meds with puree Supervision: Patient able to self feed;Intermittent supervision to cue for compensatory strategies Compensations: Slow rate;Small sips/bites Postural Changes and/or Swallow Maneuvers: Seated upright 90 degrees              Oral Care Recommendations: Oral care BID Follow up Recommendations: None Plan: All goals met    GO       Paiewonsky, M.A. CCC-SLP [phone]  Paiewonsky,  09/17/2013, 2:45 PM    

## 2013-09-17 NOTE — Progress Notes (Signed)
Subjective: Back to baseline per family.   Exam: Filed Vitals:   09/17/13 1013  BP: 104/57  Pulse: 69  Temp:   Resp: 18   Gen: In bed, NAD MS: awake. Alert, oriented to place, year.  ZO:XWRUECN:PERRL, EOMI, VFF with no visual extinction.  Motor: 5/5 throuhgout  Sensory:intact to LT, no extinction.   CT head - negative  Impression: 80 yo M presenting with seizures. Given the frequency that are seen on EEG, I would favor continued therapy and repeating an EEG again tomorrow. His mental status, however, appears to be much improved compared to yesterday when I saw him.  Recommendations: 1) Continue keppra 1gm BID 2) Dilantin 300 mg each bedtime 3) repeat EEG today.   Ritta SlotMcNeill Karl Knarr, MD [facility] [phone]  If 7pm- 7am, please page neurology on call as listed in AMION.   Addendum: pt had two seizures while connected to EEG. Given 1mg  IV ativan and loaded with keppra and increased to 1gm BID.   Ritta SlotMcNeill Amali Uhls, MD [facility] [phone]  If 7pm- 7am, please page neurology on call as listed in AMION.

## 2013-09-17 NOTE — Progress Notes (Addendum)
[facility] PROGRESS NOTE Interim History: 80 y.o. male with a history of chronic systolic heart failure, coronary artery disease, status post aortic replacement by prosthetic valve, permanent pacemaker implantation who presented today with confusion. Patient went to answer the phone, then his spouse noted that the patient was just standing there with the phone in his right hand and was somewhat confused. She also noticed some mild drooling and left lip twitching. He never lost consciousness, he was able to follow some commands, she made him sit on the chair and called 911.  EMS reported episodes of forced gaze deviation with episodes of confusion. He was subsequently evaluated in the emergency room, neurology consultation was obtained, he was noted to have a left facial droop, and mild left-sided weakness and neglect. CT of the head done in the emergency room was negative for acute abnormalities. Patient was suspected to have had an acute CVA versus seizure with Todd's paresis.   Assessment/Plan: Seizure with Todd's Paresis  - CT head negative for acute abnormalities  - Could not perform MRI because of pacemaker. - Loaded with Keppra in the emergency room - rapidly improved, w/ return to basline MS  - EEG confirms seizure activity - to continue AED, but dose increased after pt had recurrent seizures during EEG 8/7  - EEG on 8.9.2015: pt had two seizures while connected to EEG. Given 1mg  IV ativan and loaded with keppra and increased to 1gm BID.  - EEG again on 8.10.2015. - cont ativan PRN.  Encephalopathy acute  - Multifactorial due to post ictal state + ativan + sundowning - transfer out of SDU   Ischemic cardiomyopathy / chronic systolic heart failure + grade 3 diastolic CHF  - Compensated clinically - to f/u with Dr. Myrtis Ser for pre-op clearance eval prior to BPH surgery    BPH with urinary retention  - Admit w/ foley in place - scheduled for a urologic procedure in the next few  days  - UA c/w UTI, but findings could be due to colonization related to urinary catheter, no fever no leukocytosis, no antibiotics at this time. - Given current confusion, will recheck UA w/ culture and consider tx if cx positive   PACEMAKER, PERMANENT   S/P aortic valve replacement with bioprosthetic valve  No clinical evidence of valve dysfxn  Code Status: NO CODE  Family Communication: spoke w/ pt, wife, and daughter at bedside at length  Disposition Plan: possible d/c home Monday.   Consultants:  neuro  Procedures:  CT head  EEG  Antibiotics:  None  HPI/Subjective: No complains  Objective: Filed Vitals:   09/16/13 1826 09/16/13 2100 09/17/13 0201 09/17/13 0637  BP: 107/57 108/52 97/45 109/50  Pulse: 64 61 61 62  Temp: 97.3 F (36.3 C) 97.2 F (36.2 C) 98.3 F (36.8 C) 97.6 F (36.4 C)  TempSrc: Oral Oral Oral Oral  Resp: 16 16 16 16   Height:      Weight:      SpO2: 100% 98% 100% 99%    Intake/Output Summary (Last 24 hours) at 09/17/13 0849 Last data filed at 09/17/13 0523  Gross per 24 hour  Intake    120 ml  Output   1375 ml  Net  -1255 ml   Filed Weights   09/13/13 1732 09/13/13 2248  Weight: 65.318 kg (144 lb) 63.1 kg (139 lb 1.8 oz)    Exam:  General: Alert, awake, oriented x1, in no acute distress.  HEENT: No bruits, no goiter.  Heart:  Regular rate and rhythm. Lungs: Good air movement, clear. Abdomen: Soft, nontender, nondistended, positive bowel sounds.    Data Reviewed: Basic Metabolic Panel:  Recent Labs Lab 09/13/13 1717 09/13/13 1736 09/14/13 0235  NA 134* 134* 138  K 4.2 4.1 4.0  CL 95* 97 101  CO2 26  --  25  GLUCOSE 98 99 87  BUN 21 23 19   CREATININE 1.27 1.40* 1.16  CALCIUM 9.0  --  8.5   Liver Function Tests:  Recent Labs Lab 09/13/13 1717  AST 18  ALT 10  ALKPHOS 60  BILITOT 0.3  PROT 7.3  ALBUMIN 4.2   No results found for this basename: LIPASE, AMYLASE,  in the last 168 hours No results found for  this basename: AMMONIA,  in the last 168 hours CBC:  Recent Labs Lab 09/13/13 1717 09/13/13 1736 09/14/13 0235  WBC 8.3  --  8.3  NEUTROABS 5.2  --   --   HGB 12.4* 13.3 11.4*  HCT 37.9* 39.0 33.9*  MCV 87.9  --  87.1  PLT 187  --  174   Cardiac Enzymes: No results found for this basename: CKTOTAL, CKMB, CKMBINDEX, TROPONINI,  in the last 168 hours BNP (last 3 results)  Recent Labs  07/08/13 0945 08/29/13 0649  PROBNP 9136.0* 8114.0*   CBG:  Recent Labs Lab 09/14/13 1945 09/14/13 2357 09/15/13 0439 09/15/13 0830 09/15/13 1216  GLUCAP 94 120* 93 93 118*    Recent Results (from the past 240 hour(s))  MRSA PCR SCREENING     Status: None   Collection Time    09/13/13 10:46 PM      Result Value Ref Range Status   MRSA by PCR NEGATIVE  NEGATIVE Final   Comment:            The GeneXpert MRSA Assay (FDA     approved for NASAL specimens     only), is one component of a     comprehensive MRSA colonization     surveillance program. It is not     intended to diagnose MRSA     infection nor to guide or     monitor treatment for     MRSA infections.     Studies: No results found.  Scheduled Meds: . aspirin EC  81 mg Oral QHS  . carvedilol  12.5 mg Oral BID WC  . dutasteride  0.5 mg Oral QPM  . furosemide  60 mg Oral Daily  . heparin  5,000 Units Subcutaneous 3 times per day  . levETIRAcetam  1,000 mg Intravenous Q12H  . loratadine  10 mg Oral Daily  . multivitamin with minerals  1 tablet Oral Daily  . pantoprazole  40 mg Oral Daily  . phenytoin  300 mg Oral QHS  . potassium chloride SA  10 mEq Oral Daily  . ramipril  10 mg Oral QHS  . simvastatin  40 mg Oral QHS  . tamsulosin  0.8 mg Oral QHS   Continuous Infusions:    Marinda ElkFELIZ ORTIZ, ABRAHAM  [facility] Pager [phone]. If 8PM-8AM, please contact night-coverage at [email or web address], password [email or web address] 09/17/2013, 8:49 AM  LOS: 4 days      **Disclaimer: This note may have been dictated with voice  recognition software. Similar sounding words can inadvertently be transcribed and this note may contain transcription errors which may not have been corrected upon publication of note.**

## 2013-09-17 NOTE — ED Provider Notes (Signed)
80 y.o. Male with left facial droop called as code stroke.  Neurology saw and evaluated on presentation with us.  Patient with history of seizure today and not felt to be tpa candidate do to seizure.  Patient loaded with keppra here in ed.    I performed a history and physical examination of Bill Cunningham and discussed his management with Dr. Katrinka BlazingSmith.  I agree with the history, physical, assessment, and plan of care, with the following exceptions: None  I was present for the following procedures: None Time Spent in Critical Care of the patient: None Time spent in discussions with the patient and family: 7  Xeng Kucher S    Hilario Quarryanielle S Jazlin Tapscott, MD 09/17/13 1239

## 2013-09-18 ENCOUNTER — Encounter: Payer: Self-pay | Admitting: *Deleted

## 2013-09-18 MED ORDER — LEVETIRACETAM 750 MG PO TABS: 1500.0000 mg | ORAL_TABLET | Freq: Two times a day (BID) | ORAL | Status: DC

## 2013-09-18 MED ORDER — PHENYTOIN SODIUM EXTENDED 300 MG PO CAPS: 300.0000 mg | ORAL_CAPSULE | Freq: Every day | ORAL | Status: DC

## 2013-09-18 MED ORDER — LEVETIRACETAM IN NACL 1500 MG/100ML IV SOLN: 1500.0000 mg | Freq: Two times a day (BID) | INTRAVENOUS | Status: DC

## 2013-09-18 NOTE — Discharge Summary (Addendum)
Physician Discharge Summary  Bill Cunningham ZOX:[id number] DOB: [birth year] DOA: 09/13/2013  PCP: Beverley Fiedler, MD  Admit date: 09/13/2013 Discharge date: 09/18/2013  Time spent: 35 minutes  Recommendations for Outpatient Follow-up:  1. Follow up with Neurology in 1 week.  Discharge Diagnoses:  Principal Problem:   Seizure Active Problems:   Essential hypertension, benign   PACEMAKER, PERMANENT   Hyperlipidemia   Ischemic cardiomyopathy   S/P aortic valve replacement with bioprosthetic valve   BPH (benign prostatic hypertrophy) with urinary retention   Discharge Condition: stable  Diet recommendation: heart healthy  Filed Weights   09/13/13 1732 09/13/13 2248  Weight: 65.318 kg (144 lb) 63.1 kg (139 lb 1.8 oz)    History of present illness:  80 y.o. male with a Past Medical History of chronic systolic heart failure, coronary artery disease, status post aortic replacement by prosthetic valve, permanent pacemaker implantation who presents today with the above noted complaint. Please note, patient still is somewhat confused and encephalopathic, most of this history is obtained from the patient's spouse and daughter. Approximately around noon today, patient went to answer the phone, spouse noted that the patient was just standing there with the form in his right hand and was somewhat confused. She also noticed some mild drooling and left lip twitching. She claims that the side of the face was twitching as well. He however never lost consciousness, he was able to follow some commands, she made him sit on the chair and called 911   Hospital Course:  Seizure with Todd's Paresis : - Loaded with Keppra in the emergency room - rapidly improved, w/ return to basline MS  - CT head negative for acute abnormalities  - Could not perform MRI because of pacemaker. - EEG confirms seizure activity - to continue AED, but dose increased after pt had recurrent seizures during EEG 8/7  - EEG on  8.9.2015: pt had two seizures while connected to EEG. Given 1mg  IV ativan and loaded with keppra and increased to 1gm BID.  - EEG again on 8.10.2015.  Noted for seizure dilantin added and pt remained seizure free. - pt cognitive function has remain close to baseline per family he is able to due to sodoku puzzle in the last day of the hospital; stay he relates he feels back to baseline.  Encephalopathy acute  - Multifactorial due to post ictal state + ativan + sundowning. - resolved with above treatment.  Ischemic cardiomyopathy / chronic systolic heart failure + grade 3 diastolic CHF  - Compensated clinically - to f/u with Dr. Myrtis Ser for pre-op clearance eval prior to BPH surgery   BPH with urinary retention  - Admit w/ foley in place - scheduled for a urologic procedure in the next few days  - UA c/w UTI, but findings could be due to colonization related to urinary catheter, no fever no leukocytosis, no antibiotics at this time. - Given current confusion, will recheck UA w/ culture and consider tx if cx positive   PACEMAKER, PERMANENT  S/P aortic valve replacement with bioprosthetic valve  No clinical evidence of valve dysfxn   Procedures:  Ct head  Couple EEG  Consultations:  neurology  Discharge Exam: Filed Vitals:   09/18/13 0911  BP: 124/50  Pulse: 77  Temp:   Resp:     General: A&O x3 Cardiovascular: RRR Respiratory: good air movement CTA B/L  Discharge Instructions You were cared for by a hospitalist during your hospital stay. If you have any questions about your discharge  medications or the care you received while you were in the hospital after you are discharged, you can call the unit and asked to speak with the hospitalist on call if the hospitalist that took care of you is not available. Once you are discharged, your primary care physician will handle any further medical issues. Please note that NO REFILLS for any discharge medications will be authorized once you  are discharged, as it is imperative that you return to your primary care physician (or establish a relationship with a primary care physician if you do not have one) for your aftercare needs so that they can reassess your need for medications and monitor your lab values.      Discharge Instructions   Diet - low sodium heart healthy    Complete by:  As directed      Diet - low sodium heart healthy    Complete by:  As directed      Increase activity slowly    Complete by:  As directed      Increase activity slowly    Complete by:  As directed             Medication List         aspirin EC 81 MG tablet  Take 81 mg by mouth at bedtime.     AVODART 0.5 MG capsule  Generic drug:  dutasteride  Take 0.5 mg by mouth every evening.     BENEFIBER Powd  Take 1 scoop by mouth daily.     carvedilol 12.5 MG tablet  Commonly known as:  COREG  Take 12.5 mg by mouth 2 (two) times daily with a meal.     docusate sodium 100 MG capsule  Commonly known as:  COLACE  Take 100 mg by mouth at bedtime as needed for mild constipation.     fexofenadine 60 MG tablet  Commonly known as:  ALLEGRA  Take 60 mg by mouth daily.     furosemide 40 MG tablet  Commonly known as:  LASIX  Take 60 mg by mouth daily. Take one and a half tablets by mouth daily.     levETIRAcetam 750 MG tablet  Commonly known as:  KEPPRA  Take 2 tablets (1,500 mg total) by mouth 2 (two) times daily.     multivitamin tablet  Take 1 tablet by mouth daily.     nitroGLYCERIN 0.4 MG SL tablet  Commonly known as:  NITROSTAT  Place 1 tablet (0.4 mg total) under the tongue every 5 (five) minutes as needed for chest pain.     omeprazole 20 MG capsule  Commonly known as:  PRILOSEC  Take 1 capsule (20 mg total) by mouth daily.     phenytoin 300 MG ER capsule  Commonly known as:  DILANTIN  Take 1 capsule (300 mg total) by mouth at bedtime.     potassium chloride SA 20 MEQ tablet  Commonly known as:  K-DUR,KLOR-CON  Take 10  mEq by mouth daily.     ramipril 10 MG capsule  Commonly known as:  ALTACE  Take 10 mg by mouth at bedtime.     simvastatin 40 MG tablet  Commonly known as:  ZOCOR  Take 1 tablet (40 mg total) by mouth at bedtime.     tamsulosin 0.4 MG Caps capsule  Commonly known as:  FLOMAX  Take 0.8 mg by mouth at bedtime.     Zinc 50 MG Tabs  Take 50 mg by mouth daily after  breakfast.       Allergies  Allergen Reactions  . Niaspan [Niacin Er] Other (See Comments)    Unknown reaction  . Sulfonamide Derivatives Other (See Comments)    Unknown reaction   Follow-up Information   Follow up with Beverley Fiedler, MD. Schedule an appointment as soon as possible for a visit in 1 week.   Specialty:  Family Medicine   Contact information:   [address] [place] [address] [phone]       Call [facility]. (Call to make an appointment with the doctor of your choice for ongoing care of your seizure disorder.  Ask to be seen at soonest availabe next appointment.  )    Contact information:   [address] [place] [address] [phone]      Call Willa Rough, MD. (Call to reschedule a pre-operative evaluation/clinic visit.  )    Specialty:  Cardiology   Contact information:   [address] [place] [address] [phone]       Follow up with [facility]. (HH-Physical Therapy arranged)    Contact information:   [address] [place] [address] [phone]       Follow up with Van Clines, MD In 2 weeks. (hospital follow up)    Specialty:  Neurology   Contact information:   [address] [place] [address] [phone]        The results of significant diagnostics from this hospitalization (including imaging, microbiology, ancillary and laboratory) are listed below for reference.    Significant Diagnostic Studies: Dg Chest 2 View  09/14/2013   CLINICAL DATA:  Seizure.  EXAM:  CHEST  2 VIEW  COMPARISON:  08/29/2013  FINDINGS: Left chest wall pacer device is noted. The patient is status post median sternotomy and CABG procedure. Heart size and mediastinal contours are normal. Coarsened interstitial markings are identified bilaterally. Calcified pleural plaques are noted bilaterally. No airspace consolidation. The visualized skeletal structures are unremarkable.  IMPRESSION: 1. No acute cardiopulmonary abnormalities.   Electronically Signed   By: Signa Kell M.D.   On: 09/14/2013 09:07   Dg Chest 2 View  08/29/2013   CLINICAL DATA:  Cough, short of breath  EXAM: CHEST  2 VIEW  COMPARISON:  Prior chest x-ray 07/08/2013  FINDINGS: Stable position of left subclavian approach cardiac rhythm maintenance device. Leads project over the right atrium and right ventricle. Cardiac and mediastinal contours are unchanged. Status post median sternotomy with evidence of multivessel CABG and aortic valve replacement. Atherosclerotic calcifications present in the transverse aorta. Patchy and linear densities overlying both hemithoraces consistent with calcified pleural plaques. Decreased blunting of the right costophrenic angle compared to prior likely consistent with resolved pleural effusion. No focal airspace consolidation, pulmonary edema or evidence of pneumothorax. No suspicious pulmonary nodule or mass. Osseous structures are intact and unremarkable for age.  IMPRESSION: 1. Stable chest x-ray without evidence of active cardiopulmonary disease. 2. Similar appearance of bilateral calcified pleural plaques.   Electronically Signed   By: Malachy Moan M.D.   On: 08/29/2013 07:44   Ct Head Wo Contrast  09/14/2013   CLINICAL DATA:  Left-sided weakness and confusion. Evaluate for evolving infarct.  EXAM: CT HEAD WITHOUT CONTRAST  TECHNIQUE: Contiguous axial images were obtained from the base of the skull through the vertex without intravenous contrast.  COMPARISON:  09/13/2013  FINDINGS: There  is no evidence of acute cortical infarct, intracranial hemorrhage,  mass, midline shift, or extra-axial fluid collection. Moderate cerebral atrophy is unchanged. Patchy periventricular white matter hypodensities are unchanged and nonspecific but compatible with mild chronic small vessel ischemic disease.  Prior bilateral cataract extraction is noted. Mastoid air cells are clear. Minimal right greater than left maxillary sinus mucosal thickening is noted. Moderate carotid siphon calcification is present.  IMPRESSION: No evidence of acute intracranial abnormality. Mild chronic small vessel ischemic disease.   Electronically Signed   By: Sebastian Ache   On: 09/14/2013 08:29   Ct Head Wo Contrast  09/13/2013   CLINICAL DATA:  Seizure, code stroke  EXAM: CT HEAD WITHOUT CONTRAST  TECHNIQUE: Contiguous axial images were obtained from the base of the skull through the vertex without intravenous contrast.  COMPARISON:  None.  FINDINGS: There is moderate to severe atrophy. There is mild to moderate low attenuation in the deep white matter. There is no evidence of vascular territory infarct. There is no hemorrhage or extra-axial fluid. There is no evidence of mass or hydrocephalus. Calvarium is intact.  IMPRESSION: No acute intracranial abnormalities. Critical Value/emergent results were called by telephone at the time of interpretation on 09/13/2013 at 5:34 pm to Dr. Ritta Slot , who verbally acknowledged these results.   Electronically Signed   By: Esperanza Heir M.D.   On: 09/13/2013 17:34    Microbiology: Recent Results (from the past 240 hour(s))  MRSA PCR SCREENING     Status: None   Collection Time    09/13/13 10:46 PM      Result Value Ref Range Status   MRSA by PCR NEGATIVE  NEGATIVE Final   Comment:            The GeneXpert MRSA Assay (FDA     approved for NASAL specimens     only), is one component of a     comprehensive MRSA colonization     surveillance program. It is not     intended to  diagnose MRSA     infection nor to guide or     monitor treatment for     MRSA infections.     Labs: Basic Metabolic Panel:  Recent Labs Lab 09/13/13 1717 09/13/13 1736 09/14/13 0235  NA 134* 134* 138  K 4.2 4.1 4.0  CL 95* 97 101  CO2 26  --  25  GLUCOSE 98 99 87  BUN 21 23 19   CREATININE 1.27 1.40* 1.16  CALCIUM 9.0  --  8.5   Liver Function Tests:  Recent Labs Lab 09/13/13 1717  AST 18  ALT 10  ALKPHOS 60  BILITOT 0.3  PROT 7.3  ALBUMIN 4.2   No results found for this basename: LIPASE, AMYLASE,  in the last 168 hours No results found for this basename: AMMONIA,  in the last 168 hours CBC:  Recent Labs Lab 09/13/13 1717 09/13/13 1736 09/14/13 0235  WBC 8.3  --  8.3  NEUTROABS 5.2  --   --   HGB 12.4* 13.3 11.4*  HCT 37.9* 39.0 33.9*  MCV 87.9  --  87.1  PLT 187  --  174   Cardiac Enzymes: No results found for this basename: CKTOTAL, CKMB, CKMBINDEX, TROPONINI,  in the last 168 hours BNP: BNP (last 3 results)  Recent Labs  07/08/13 0945 08/29/13 0649  PROBNP 9136.0* 8114.0*   CBG:  Recent Labs Lab 09/14/13 1945 09/14/13 2357 09/15/13 0439 09/15/13 0830 09/15/13 1216  GLUCAP 94 120* 93 93 118*  Signed:  Marinda ElkFELIZ ORTIZ, ABRAHAM  [facility] 09/18/2013, 11:22 AM

## 2013-09-18 NOTE — Progress Notes (Signed)
Pt discharge education and instructions completed with pt and family members at bedside. All voices understanding and denies any questions. Pt IV and telemetry removed from pt. Pt provided prescriptions for Keppra and Dilantin; pt discharge home with spouse and daughter to transport him home. Pt transported off unit via wheelchair with his walker and belongings at side. Pt foley intact with drainage bag changed Leg bag. Arabella MerlesP. Amo Senita Corredor RN.

## 2013-09-18 NOTE — Progress Notes (Signed)
Physical Therapy Treatment Patient Details Name: Bill Cunningham MRN: [id number] DOB: Jun 27, 1921 Today's Date: 09/18/2013    History of Present Illness Bill Cunningham is an 80 y.o. Male admitted 09/13/13 with c/o confusion, mild drooling, and left face/lip twitching.  CT was negative for acute abnormalities and MRI unable to be performed due to pacemaker. Pt with PMH of chronic systolic heart failure, CAD, status post aortic replacement by prosthetic valve, and permanent pacemaker. Bedside EEGs performed on 8/7 and 8/8 recorded right frontal seizures.    PT Comments    Pt was able to complete his therapy despite low BP earlier and had recovered by the time PT in and re-checked.  Family able to ask questions about discharge and observe the stairs with PT, wife comfortable with helping him.  Advised wife to supervise all gait until further guidance given by HHPT.  Follow Up Recommendations  Home health PT;Supervision/Assistance - 24 hour     Equipment Recommendations  Rolling walker with 5" wheels    Recommendations for Other Services       Precautions / Restrictions Precautions Precautions: Fall;ICD/Pacemaker Precaution Comments: Pt will need to be observed initially at home due to low BP Restrictions Weight Bearing Restrictions: No    Mobility  Bed Mobility Overal bed mobility: Modified Independent Bed Mobility: Supine to Sit     Supine to sit: Modified independent (Device/Increase time);Supervision     General bed mobility comments: slow to come up and needed reminding about sequence  Transfers Overall transfer level: Needs assistance Equipment used: Rolling walker (2 wheeled) Transfers: Sit to/from Stand Sit to Stand: Min guard         General transfer comment: verbal cues for hand placement and pt relatively unaware of his issues with safety  Ambulation/Gait Ambulation/Gait assistance: Min guard Ambulation Distance (Feet): 175 Feet Assistive device: Rolling  walker (2 wheeled) Gait Pattern/deviations: Step-through pattern;Wide base of support;Ataxic Gait velocity: slow Gait velocity interpretation: Below normal speed for age/gender General Gait Details: Assist for stability and safety with navigation   Stairs Stairs: Yes Stairs assistance: Min guard Stair Management: One rail Left Number of Stairs: 5 General stair comments: asked family to come with PT to observe and  reminded them of sequence.  Saw pt requiring verbal cues to manage, although physical help not needed  Wheelchair Mobility    Modified Rankin (Stroke Patients Only) Modified Rankin (Stroke Patients Only) Pre-Morbid Rankin Score: No symptoms Modified Rankin: Moderate disability     Balance Overall balance assessment: Needs assistance Sitting-balance support: Feet supported;Bilateral upper extremity supported Sitting balance-Leahy Scale: Good   Postural control: Posterior lean;Other (comment) (More visually attentive to R side, especially obstacles) Standing balance support: Bilateral upper extremity supported Standing balance-Leahy Scale: Fair                      Cognition Arousal/Alertness: Awake/alert Behavior During Therapy: WFL for tasks assessed/performed Overall Cognitive Status: Impaired/Different from baseline Area of Impairment: Attention;Memory;Safety/judgement;Problem solving;Awareness   Current Attention Level: Alternating;Selective Memory: Decreased short-term memory Following Commands: Follows one step commands consistently Safety/Judgement: Decreased awareness of safety;Decreased awareness of deficits Awareness: Emergent Problem Solving: Slow processing;Difficulty sequencing;Requires verbal cues;Requires tactile cues General Comments: Required cues for sequencing steps which family observed.  Asked wife to walk with him on steps, and to hold his free hand as the stairs have one rail.  Pt is more focused with contact on him during  mobility    Exercises General Exercises - Lower Extremity Ankle  Circles/Pumps: AROM;Both;15 reps Hip Flexion/Marching: AROM;Both;15 reps;Seated    General Comments General comments (skin integrity, edema, etc.): Wife more comfortable as pt is able to safely navigate steps and pt/family spoke with PT about wife supervising all his gait until further guidance from HHPT given      Pertinent Vitals/Pain Pain Assessment: No/denies pain.  Bp was 124/50, pulse 68 after rechecking from earlier.    Home Living                      Prior Function            PT Goals (current goals can now be found in the care plan section) Acute Rehab PT Goals Patient Stated Goal: Go home with family to help him walk Progress towards PT goals: Progressing toward goals    Frequency  Min 4X/week    PT Plan Current plan remains appropriate    Co-evaluation             End of Session   Activity Tolerance: Patient tolerated treatment well Patient left: in chair;with call bell/phone within reach;with family/visitor present     Time: 0903-0929 PT Time Calculation (min): 26 min  Charges:  $Gait Training: 8-22 mins $Therapeutic Exercise: 8-22 mins                    G Codes:      Ivar Drape Oct 13, 2013, 9:48 AM  Samul Dada, PT MS Acute Rehab Dept. Number: [phone]

## 2013-09-18 NOTE — Progress Notes (Addendum)
Subjective: No clear seizures on EEG yesterday, but continues to have a very active EEG. Keppra increased. No further episodes of confusion.  He did soduku last night and reports no more difficulty than normal with them.    Exam: Filed Vitals:   09/18/13 0554  BP: 109/59  Pulse: 68  Temp: 98.2 F (36.8 C)  Resp: 18   Gen: In bed, NAD MS: awake. Alert, oriented to place, year.  ZO:XWRUECN:PERRL, EOMI, VFF with no visual extinction. No further dysarthria.  Motor: 5/5 throuhgout, no further left sided weakness  Sensory:intact to LT, no extinction.   Impression: 80 yo M presenting with seizures. Given the frequency that are seen on EEG, I would favor continued therapy and repeating an EEG again tomorrow. His mental status, however, appears to be back to baseline despite continued active EEG. I would favor continuing to give him some time on this regimen and then repeating EEG in a week or two to see if his EEG is still as active.   His seizure consisted of confusion, usually accompanied by dysarthria and mild left facial droop. Occasionally, but uncommonly, left facial twitching and left neglect were seen as well.   Recommendations: 1) Continue keppra 1.5gm BID 2) Dilantin 300 mg each bedtime 3) I would have patient follow up with Dr. Karel JarvisAquino as outpatient.   Ritta SlotMcNeill Breck [place], MD [facility] [phone]  If 7pm- 7am, please page neurology on call as listed in AMION.

## 2013-09-19 ENCOUNTER — Telehealth: Payer: Self-pay | Admitting: Cardiology

## 2013-09-19 NOTE — Telephone Encounter (Signed)
Bill Cunningham is aware of DOD recommendations she states the pt's family are aware for pt  to hold Lasix, ramipril and carvedilol and to call the office  tomorrow with BP reading.

## 2013-09-19 NOTE — Telephone Encounter (Signed)
New message     Calling to give bp reading for today sitting 80/40 rt arm; sitting 80/50 lft arm; standing rt arm 80/32; standing lft arm 60/40.  Pt is still having dizziness.  Pt is on lasix 40mg  and ramipil 10mg  daily.

## 2013-09-19 NOTE — Telephone Encounter (Signed)
Pt was in the hospital for seizures, dizziness. Pt was discharged yesterday 09/18/13. [facility], was seen pt  today. Pt's BP left arm is 80/50 sitting, standing 60/40 pt still having dizziness. Pt is on lasix 40 mg, ramipril 10 mg daily at night and carvedilol 12.5 mg twice a day. Pt took  Lasix and carvedilol in this morning. Der. Allred DOD aware and recommends to hold the Carvedilol, ramipril, and lasix 60 mg. Pt to have BP check and call the office with reading. Left Tonya a message to call back.

## 2013-09-20 NOTE — Telephone Encounter (Signed)
Follow up    Home health calling to discuss patient medication pm dosage and am dosage.    K+

## 2013-09-21 NOTE — Telephone Encounter (Signed)
LMTCB

## 2013-09-21 NOTE — Telephone Encounter (Signed)
F/u    Calling to discuss pt's medication dosage.Bill PikesSusan stated she called twice yesterday and need call back asap. Please call

## 2013-09-21 NOTE — Telephone Encounter (Signed)
[name], [facility], states that the pt did have an episode of SOB yesterday am, that last night his BP was 120/60 checked in both arms and that he has no edema at this time. He was not given his Lasix, Carvedilol or Ramipril dose last night as his BP was normal for him. His O2 was checked this am and was 97%.  [name] states that they have provided the pt/family with a pulse ox so his O2 can be checked during these episodes and that they have a BP cuff to check his BP when needed. [name] is calling this morning to report the pts BP and to get further instructions on the pts medications that are being held at this time.     Per Dr Anne FuSkains, DOD, [name] is advised to continue to hold the pts Carvedilol, Ramipril and Lasix, to monitor and record the pts BP and O2 readings and to call office if his BP starts to climb, he has increased SOB, edema or dizziness. [name] verbalized understanding.   Will send note to Dr Myrtis SerKatz as an Lorain ChildesFYI.

## 2013-09-28 ENCOUNTER — Telehealth: Payer: Self-pay | Admitting: Cardiology

## 2013-09-28 DIAGNOSIS — I5022 Chronic systolic (congestive) heart failure: Secondary | ICD-10-CM

## 2013-09-28 NOTE — Telephone Encounter (Signed)
New message      Pt was taken off lasix and potassium.  Today, he has gained 5 lbs since the 17th and he has edema in his ankles and feet.  Lungs are clear.  His HR is 95-96.  Bp today was 116/70.  Please advise regarding wt gain and edema.

## 2013-09-28 NOTE — Telephone Encounter (Signed)
[facility] Darl Pikes(Susan) calling stating [name] has gained 5 lbs since the 17th..  States BP has been running 131-137/80 over the last week.  BP today 116/70. States lungs clear, no SOB.  He does have 2+ edema in LE and feet.  HR 95-96.  States Lasix, Potassium, Carvedilol and Ramipril were stopped 8/14.  Spoke w/Dr. Excell Seltzerooper (DOD) who states to start back his Lasix 40 mg daily and KCL 20 meq daily.  Suggested he get a BMET in about 2 wks.  [name] is scheduled to see Dr. Myrtis SerKatz on 8/31. Will get a BMET also at appointment.  Notified [name] who verbalizes understanding and will confirm with pt appointment time and lab.

## 2013-10-08 ENCOUNTER — Ambulatory Visit: Payer: Medicare Other | Admitting: Cardiology

## 2013-10-08 ENCOUNTER — Telehealth: Payer: Self-pay | Admitting: Cardiology

## 2013-10-08 ENCOUNTER — Other Ambulatory Visit: Payer: Medicare Other

## 2013-10-08 NOTE — Telephone Encounter (Addendum)
**Note De-Identified  Obfuscation** The pt was scheduled to see Dr Myrtis Ser today but that appointment had to be rescheduled to 9/2 due to a change in Dr Henrietta Hoover schedule.  Mindi Junker states that the pt has 2 plus pitting edema in his lower legs and feet. She states that it is hard for her to know if he has any SOB because he sleeps a lot now that he is on seizure medications. She wants to know what to do.     Per Dr Tenny Craw, DOD, I attempted to have the pt come in to see Dr Tenny Craw today but Mindi Junker states that they cant bring him today.   Per Dr Tenny Craw the pt needs to take 60 mg of Lasix tomorrow morning only and have a BMET and a BNP drawn by [facility]N. I called [facility] and spoke with Almira Coaster who states that they will go to the pts home tomorrow am to draw the BMET and BNP and will fax results to Dr Myrtis Ser so we will have results at the pts f/u on 9/2 . Mindi Junker, the pts daughter is advised and she verbalized understanding and thanked me for my assistance.    Will forward note to Dr Myrtis Ser as an Lorain Childes.

## 2013-10-08 NOTE — Telephone Encounter (Signed)
New problem   Per [facility].the still has edema in lower legs and feet pidding edema 2 plus.  Please advise how much lasix pt need to be taking. Please ask for pt's daughter Bill Cunningham when calling.

## 2013-10-09 ENCOUNTER — Encounter: Payer: Self-pay | Admitting: Cardiology

## 2013-10-10 ENCOUNTER — Encounter: Payer: Self-pay | Admitting: Cardiology

## 2013-10-10 ENCOUNTER — Ambulatory Visit (INDEPENDENT_AMBULATORY_CARE_PROVIDER_SITE_OTHER): Payer: Medicare Other | Admitting: Cardiology

## 2013-10-10 VITALS — BP 104/60 | HR 86 | Ht 67.0 in | Wt 136.4 lb

## 2013-10-10 DIAGNOSIS — N401 Enlarged prostate with lower urinary tract symptoms: Secondary | ICD-10-CM

## 2013-10-10 DIAGNOSIS — I509 Heart failure, unspecified: Secondary | ICD-10-CM

## 2013-10-10 DIAGNOSIS — I2510 Atherosclerotic heart disease of native coronary artery without angina pectoris: Secondary | ICD-10-CM

## 2013-10-10 DIAGNOSIS — N138 Other obstructive and reflux uropathy: Secondary | ICD-10-CM

## 2013-10-10 DIAGNOSIS — R339 Retention of urine, unspecified: Secondary | ICD-10-CM

## 2013-10-10 DIAGNOSIS — I2589 Other forms of chronic ischemic heart disease: Secondary | ICD-10-CM

## 2013-10-10 DIAGNOSIS — Z954 Presence of other heart-valve replacement: Secondary | ICD-10-CM

## 2013-10-10 DIAGNOSIS — R338 Other retention of urine: Secondary | ICD-10-CM

## 2013-10-10 DIAGNOSIS — I5022 Chronic systolic (congestive) heart failure: Secondary | ICD-10-CM

## 2013-10-10 DIAGNOSIS — I255 Ischemic cardiomyopathy: Secondary | ICD-10-CM

## 2013-10-10 DIAGNOSIS — R569 Unspecified convulsions: Secondary | ICD-10-CM

## 2013-10-10 MED ORDER — POTASSIUM CHLORIDE CRYS ER 10 MEQ PO TBCR: 10.0000 meq | EXTENDED_RELEASE_TABLET | Freq: Every day | ORAL | Status: DC

## 2013-10-10 MED ORDER — FUROSEMIDE 40 MG PO TABS: 60.0000 mg | ORAL_TABLET | Freq: Every day | ORAL | Status: DC

## 2013-10-10 MED ORDER — NITROGLYCERIN 0.4 MG SL SUBL: 0.4000 mg | SUBLINGUAL_TABLET | SUBLINGUAL | Status: DC | PRN

## 2013-10-10 MED ORDER — SIMVASTATIN 40 MG PO TABS: 40.0000 mg | ORAL_TABLET | Freq: Every day | ORAL | Status: DC

## 2013-10-10 MED ORDER — CARVEDILOL 3.125 MG PO TABS: 3.1250 mg | ORAL_TABLET | Freq: Two times a day (BID) | ORAL | Status: DC

## 2013-10-10 NOTE — Assessment & Plan Note (Signed)
He has a catheter in place now. He is not strong enough to consider a prostate procedure. We will continue to follow him over time. If he gets stronger overall we will readdress whether any procedure can be considered.

## 2013-10-10 NOTE — Assessment & Plan Note (Signed)
His aortic valve prosthesis continues to work well by echo in August, 2015. No change in therapy.

## 2013-10-10 NOTE — Progress Notes (Signed)
Patient ID: Bill Cunningham, male   DOB: 02-24-1921, 80 y.o.   MRN: [id number]    HPI  Patient is seen today in followup systolic congestive heart failure. When I saw him last we are trying to possibly clear him for prostate procedure. He had a followup echo and it showed that his left ventricular function was worse. In the meantime he was admitted with a seizure disorder. He had focal seizures affecting the right side of his face. He was placed on several antiseizure medicines. His blood pressure was unstable and his carvedilol and Prempro were stopped. He is now more stable. Careful attention to checking his blood pressure and pulse at home shows that his pressure runs in the range of 110 to 115 systolic. His heart rate is in the range of 75-80. He is unhappy having to wear his urinary catheter all the time.  As part of today's evaluation I have carefully reviewed the data concerning his hospitalization. I reviewed the labs and 90 x-rays and the discharge summary.   Allergies  Allergen Reactions  . Niaspan [Niacin Er] Other (See Comments)    Unknown reaction  . Sulfonamide Derivatives Other (See Comments)    Unknown reaction    Current Outpatient Prescriptions  Medication Sig Dispense Refill  . aspirin EC 81 MG tablet Take 81 mg by mouth at bedtime.       . AVODART 0.5 MG capsule Take 0.5 mg by mouth every evening.       . docusate sodium (COLACE) 100 MG capsule Take 100 mg by mouth at bedtime as needed for mild constipation.       . fexofenadine (ALLEGRA) 60 MG tablet Take 60 mg by mouth daily.      . furosemide (LASIX) 40 MG tablet Take 60 mg by mouth daily. Take one and a half tablets by mouth daily.      [name] levETIRAcetam (KEPPRA) 750 MG tablet Take 2 tablets (1,500 mg total) by mouth 2 (two) times daily.  60 tablet  0  . Multiple Vitamin (MULTIVITAMIN) tablet Take 1 tablet by mouth daily.        . nitroGLYCERIN (NITROSTAT) 0.4 MG SL tablet Place 1 tablet (0.4 mg total) under the tongue  every 5 (five) minutes as needed for chest pain.  30 tablet  0  . omeprazole (PRILOSEC) 20 MG capsule Take 1 capsule (20 mg total) by mouth daily.  90 capsule  0  . phenytoin (DILANTIN) 300 MG ER capsule Take 1 capsule (300 mg total) by mouth at bedtime.  30 capsule  0  . potassium chloride SA (K-DUR,KLOR-CON) 20 MEQ tablet Take 10 mEq by mouth daily.      . simvastatin (ZOCOR) 40 MG tablet Take 1 tablet (40 mg total) by mouth at bedtime.  90 tablet  3  . tamsulosin (FLOMAX) 0.4 MG CAPS capsule Take 0.8 mg by mouth at bedtime.      . Wheat Dextrin (BENEFIBER) POWD Take 1 scoop by mouth daily.       . Zinc 50 MG TABS Take 50 mg by mouth daily after breakfast.        No current facility-administered medications for this visit.    History   Social History  . Marital Status: Married    Spouse Name: N/A    Number of Children: N/A  . Years of Education: N/A   Occupational History  . Retired Radiographer, therapeutic    Social History Main Topics  . Smoking status: Former Smoker  Quit date: 02/08/1954  . Smokeless tobacco: Not on file  . Alcohol Use: No  . Drug Use: No  . Sexual Activity: Not on file   Other Topics Concern  . Not on file   Social History Narrative   No CAD known in any siblings.          History reviewed. No pertinent family history.  Past Medical History  Diagnosis Date  . Hyperlipidemia   . LBBB (left bundle branch block)   . Depression   . COPD (chronic obstructive pulmonary disease)   . Calcium oxalate renal stones   . Dizziness   . GERD (gastroesophageal reflux disease)   . Diverticulosis   . IBS (irritable bowel syndrome)   . Leg cramps     not vascular in origin   . Liver cyst   . Wide-complex tachycardia     post op - on a beta blocker   . Sinus congestion   . Bleeding nose     cauteriszed dr Lazarus Salines  / cauterization repeated 10/11  . Coronary artery disease     Nuclear June 03, 2010, scar anterior wall apex and inferior wall, no definite ischemia,    not  gated  . Intolerance of drug     Niaspan  . S/P aortic valve replacement with bioprosthetic valve     echo.[name] April, 2011... good valve function... mild AI  . S/P CABG (coronary artery bypass graft) January, 2007  . LV dysfunction     EF 45% / EF 25-30%, echo, April, 2011 / EF 25-30%, echo, April, 2012, akinesis of the inferior and posterior walls  . Ischemic cardiomyopathy     EF followed carefully  . Carotid artery disease     Doppler, 2011, bilateral 40-59%    /   Doppler, March, 2013, no change, bilateral 40-59%  . BPH (benign prostatic hyperplasia)     2013  . Cough     January, 2014  . Ejection fraction < 50%     Past Surgical History  Procedure Laterality Date  . Cholecystectomy    . Aortic valve replacement  02/2005  . Insert / replace / remove pacemaker      must call rep to interrogate pacemaker [phone], [employer], older model  . Coronary artery bypass graft      x5  -- last one in 07  . Lithotripsy    . Mosaic ultra porcine heart valve      medtronic,  [id number], 03/02/2005, MD clarence Cornelius Moras [phone], device questions [phone]    Patient Active Problem List   Diagnosis Date Noted  . Seizure 09/13/2013  . CKD (chronic kidney disease) stage 3, GFR 30-59 ml/min 07/20/2013  . BPH (benign prostatic hypertrophy) with urinary retention 07/20/2013  . Ejection fraction < 50%   . Chronic systolic CHF (congestive heart failure) 02/17/2012  . Cough   . S/P aortic valve replacement with bioprosthetic valve   . Hyponatremia 11/26/2011  . Dyspnea on exertion 11/26/2011  . BPH (benign prostatic hyperplasia)   . Carotid artery disease   . Coronary artery disease   . Ischemic cardiomyopathy   . Hyperlipidemia   . LBBB (left bundle branch block)   . UNSPECIFIED HEMORRHAGE 12/04/2009  . DIVERTICULOSIS OF COLON 06/05/2009  . COPD 01/19/2009  . GERD 01/19/2009  . AORTIC VALVE REPLACEMENT, HX OF 01/19/2009  . PACEMAKER, PERMANENT 01/19/2009  . CORONARY ARTERY  BYPASS GRAFT, HX OF 01/19/2009  . Essential hypertension, benign 11/01/2008  ROS   Patient is oriented to person time and place. Affect is normal. He is here with his wife. His daughter has come in from [place] to help. He denies fever, chills, headache, sweats, rash, change in vision, change in hearing, chest pain, cough, nausea or vomiting, and urinary symptoms. All other systems are reviewed and are negative.  PHYSICAL EXAM  He is oriented to person time and place. Affect is normal. He is here with his wife. His daughter from [place] is here also. Head is atraumatic. Sclera and conjunctiva are normal. There is no jugulovenous distention. Lungs are clear. Respiratory effort is nonlabored. Cardiac exam reveals S1 and S2. Abdomen is soft. There is trace peripheral edema. There no musculoskeletal deformities. There are no skin rashes.  Filed Vitals:   10/10/13 1457  BP: 104/60  Pulse: 86  Height:  (1.702 m)  Weight: 136 lb 6.4 oz (61.871 kg)  SpO2: 99%    EKG  ASSESSMENT & PLAN

## 2013-10-10 NOTE — Assessment & Plan Note (Signed)
His volume status is stable today. His meds have been adjusted recently. He is paying careful attention to his salt and fluid and his diuretics.

## 2013-10-10 NOTE — Assessment & Plan Note (Signed)
Coronary disease is stable. No change in therapy. 

## 2013-10-10 NOTE — Patient Instructions (Signed)
**Note De-Identified  Obfuscation** Your physician has recommended you make the following change in your medication: start taking Carvedilol 3.125 mg twice daily  Your physician recommends that you schedule a follow-up appointment in: 3 to 4 weeks

## 2013-10-10 NOTE — Assessment & Plan Note (Signed)
He is on medications for his recent seizures. He'll be following carefully with neurology.

## 2013-10-10 NOTE — Assessment & Plan Note (Signed)
Carvedilol and ramapril  had been stopped when he was unstable in the hospital. His blood pressures are now stable. His pulse rate will tolerate a small dose of carvedilol. The first step will be to restart carvedilol at 3.125 twice a day. I will see him back for followup and we'll see if his meds can be re\re titrated over time.  As part of today's evaluation I spent greater than 25 minutes on is total care. More than half of this time is been with direct contact with the patient and his wife and his daughter. We have reviewed the overall situation extensively.

## 2013-10-11 ENCOUNTER — Encounter: Payer: Self-pay | Admitting: Neurology

## 2013-10-11 ENCOUNTER — Ambulatory Visit (INDEPENDENT_AMBULATORY_CARE_PROVIDER_SITE_OTHER): Payer: Medicare Other | Admitting: Neurology

## 2013-10-11 VITALS — BP 96/64 | HR 93 | Temp 98.4°F | Ht 67.0 in | Wt 135.0 lb

## 2013-10-11 DIAGNOSIS — G40209 Localization-related (focal) (partial) symptomatic epilepsy and epileptic syndromes with complex partial seizures, not intractable, without status epilepticus: Secondary | ICD-10-CM

## 2013-10-11 DIAGNOSIS — I2510 Atherosclerotic heart disease of native coronary artery without angina pectoris: Secondary | ICD-10-CM

## 2013-10-11 MED ORDER — LEVETIRACETAM 100 MG/ML PO SOLN: ORAL | Status: DC

## 2013-10-11 NOTE — Patient Instructions (Signed)
1. Routine EEG today 2. We will plan to adjust your medications based on EEG results.  3. Follow-up in 3 months

## 2013-10-11 NOTE — Progress Notes (Signed)
NEUROLOGY CONSULTATION NOTE  DRAKEN FARRIOR MRN: [id number] DOB: 05/30/1921  Referring provider: [name] Primary care provider: [name]  Reason for consult:  Seizures, hospital follow-up   Dear Dr Barbaraann Barthel:  Thank you for your kind referral of Bill Cunningham for consultation of the above symptoms. Although his history is well known to you, please allow me to reiterate it for the purpose of our medical record. The patient was accompanied to the clinic by his wife and daughter who also provide collateral information. Records and images were personally reviewed where available.  HISTORY OF PRESENT ILLNESS: This is a very pleasant 80 year old right-handed man with a history of CAD s/p CABG, aortic valve replacement, cardiomyopathy s/p pacemaker placement, in his usual state of health until 09/13/13 when he was noted by his wife to have behavioral arrest with drooling and left facial twitching. En route to the ER, EMS reported repeated episodes of forced gaze deviation and behavioral arrest. He was brought to [facility] ER where he was noted to have left facial droop and left-sided weakness and neglect, as well as witnessed episode of behavioral arrest with left facial twitching lasting 10-20 seconds. He was started on Keppra, then Dilantin was added. Head CT x 2 did not show any acute changes, unable to obtain MRI due to pacemaker.  EEG on hospital day 2 showed frequent sharp waves in the right hemisphere, maximal over the right frontal region, with 3 electrographic seizures lasting 60-90 seconds arising from the right frontal region. EEG on day 3 captured a 3-minute seizure again arising from the right frontal region.  He was initially confused then improved to baseline the day prior to discharge, with EEG showing runs of delta slowing over the right hemisphere with occasional right frontal sharp waves, no electrographic seizures.  He was discharged on Keppra  BID and Dilantin   qhs.    Since hospital discharge 3 weeks ago, family denies any further episodes of confusion, behavioral arrest, or left facial twitching.  He however has significant fatigue, getting tired easily when working with PT twice a week. He is in a wheelchair today, but family reports he uses a walker at home. His balance is off, but he reports his legs are stronger.  Prior to hospitalization, he was active, driving and gardening.  He gets a little confused in the evening and early morning hours. His daughter has not been able to get him back to [place] yet.  They also report that his "emotions are all over the place," he states he is so sad because of the driving restriction and hold up on plans for urinary catheter removal.  He denies any headaches, speech difficulties, focal numbness/tingling/weakness. He denies any olfactory/gustatory hallucinations, nausea/vomiting, myoclonic jerks. He has occasional itching for the past month, "sometimes my even my eyes itch," no rash. He denies any diplopia, dysarthria, neck/back pain. He has some dizziness for the past few mornings while feeling short of breath.  He has some difficulty swallowing Keppra pills and family has been crushing them in pudding.  He denies any falls, but did slide down the bed one time.    Epilepsy Risk Factors:  He had a normal birth and early development.  There is no history of febrile convulsions, CNS infections such as meningitis/encephalitis, significant traumatic brain injury, neurosurgical procedures, or family history of seizures.  I personally reviewed head CT x 2 done in the hospital, with no acute changes noted.  Echo showed  EF 15-20%, LV moderately dilated, grade 3 diastolic dysfunction, RV mildly dilated.  Laboratory Data:  Lab Results  Component Value Date   WBC 8.3 09/14/2013   HGB 11.4* 09/14/2013   HCT 33.9* 09/14/2013   MCV 87.1 09/14/2013   PLT 174 09/14/2013     Chemistry      Component Value Date/Time   NA 138  09/14/2013 0235   K 4.0 09/14/2013 0235   CL 101 09/14/2013 0235   CO2 25 09/14/2013 0235   BUN 19 09/14/2013 0235   CREATININE 1.16 09/14/2013 0235      Component Value Date/Time   CALCIUM 8.5 09/14/2013 0235   ALKPHOS 60 09/13/2013 1717   AST 18 09/13/2013 1717   ALT 10 09/13/2013 1717   BILITOT 0.3 09/13/2013 1717     Lab Results  Component Value Date   PHENYTOIN 13.8 09/17/2013   Lab Results  Component Value Date   HGBA1C 6.3* 09/14/2013   Lab Results  Component Value Date   CHOL 107 09/14/2013   HDL 38* 09/14/2013   LDLCALC 59 09/14/2013   TRIG 48 09/14/2013   CHOLHDL 2.8 09/14/2013     PAST MEDICAL HISTORY: Past Medical History  Diagnosis Date  . Hyperlipidemia   . LBBB (left bundle branch block)   . Depression   . COPD (chronic obstructive pulmonary disease)   . Calcium oxalate renal stones   . Dizziness   . GERD (gastroesophageal reflux disease)   . Diverticulosis   . IBS (irritable bowel syndrome)   . Leg cramps     not vascular in origin   . Liver cyst   . Wide-complex tachycardia     post op - on a beta blocker   . Sinus congestion   . Bleeding nose     cauteriszed dr Lazarus Salines  / cauterization repeated 10/11  . Coronary artery disease     Nuclear June 03, 2010, scar anterior wall apex and inferior wall, no definite ischemia,    not gated  . Intolerance of drug     Niaspan  . S/P aortic valve replacement with bioprosthetic valve     echo.[name] April, 2011... good valve function... mild AI  . S/P CABG (coronary artery bypass graft) January, 2007  . LV dysfunction     EF 45% / EF 25-30%, echo, April, 2011 / EF 25-30%, echo, April, 2012, akinesis of the inferior and posterior walls  . Ischemic cardiomyopathy     EF followed carefully  . Carotid artery disease     Doppler, 2011, bilateral 40-59%    /   Doppler, March, 2013, no change, bilateral 40-59%  . BPH (benign prostatic hyperplasia)     2013  . Cough     January, 2014  . Ejection fraction < 50%     PAST SURGICAL  HISTORY: Past Surgical History  Procedure Laterality Date  . Cholecystectomy    . Aortic valve replacement  02/2005  . Insert / replace / remove pacemaker      must call rep to interrogate pacemaker [phone], [employer] Jude, older model  . Coronary artery bypass graft      x5  -- last one in 07  . Lithotripsy    . Mosaic ultra porcine heart valve      medtronic,  [id number], 03/02/2005, MD clarence Cornelius Moras [phone], device questions [phone]    MEDICATIONS: Current Outpatient Prescriptions on File Prior to Visit  Medication Sig Dispense Refill  . aspirin EC 81 MG tablet Take  81 mg by mouth at bedtime.       . AVODART 0.5 MG capsule Take 0.5 mg by mouth every evening.       . carvedilol (COREG) 3.125 MG tablet Take 1 tablet (3.125 mg total) by mouth 2 (two) times daily with a meal.  60 tablet  3  . docusate sodium (COLACE) 100 MG capsule Take 100 mg by mouth at bedtime as needed for mild constipation.       . fexofenadine (ALLEGRA) 60 MG tablet Take 60 mg by mouth daily.      . furosemide (LASIX) 40 MG tablet Take 1.5 tablets (60 mg total) by mouth daily. Take one and a half tablets by mouth daily.  135 tablet  1  . nitroGLYCERIN (NITROSTAT) 0.4 MG SL tablet Place 1 tablet (0.4 mg total) under the tongue every 5 (five) minutes as needed for chest pain.  25 tablet  3  . omeprazole (PRILOSEC) 20 MG capsule Take 1 capsule (20 mg total) by mouth daily.  90 capsule  0  . phenytoin (DILANTIN) 300 MG ER capsule Take 1 capsule (300 mg total) by mouth at bedtime.  30 capsule  0  . potassium chloride SA (K-DUR,KLOR-CON) 10 MEQ tablet Take 1 tablet (10 mEq total) by mouth daily.  90 tablet  1  . simvastatin (ZOCOR) 40 MG tablet Take 1 tablet (40 mg total) by mouth at bedtime.  90 tablet  3  . tamsulosin (FLOMAX) 0.4 MG CAPS capsule Take 0.8 mg by mouth at bedtime.      . Wheat Dextrin (BENEFIBER) POWD Take 1 scoop by mouth daily.       . Zinc 50 MG TABS Take 50 mg by mouth daily after breakfast.        . Multiple Vitamin (MULTIVITAMIN) tablet Take 1 tablet by mouth daily.         No current facility-administered medications on file prior to visit.    ALLERGIES: Allergies  Allergen Reactions  . Niaspan [Niacin Er] Other (See Comments)    Unknown reaction  . Sulfonamide Derivatives Other (See Comments)    Unknown reaction    FAMILY HISTORY: No family history on file.  SOCIAL HISTORY: History   Social History  . Marital Status: Married    Spouse Name: N/A    Number of Children: N/A  . Years of Education: N/A   Occupational History  . Retired Radiographer, therapeutic    Social History Main Topics  . Smoking status: Former Smoker    Quit date: 02/08/1954  . Smokeless tobacco: Not on file  . Alcohol Use: No  . Drug Use: No  . Sexual Activity: Not on file   Other Topics Concern  . Not on file   Social History Narrative   No CAD known in any siblings.          REVIEW OF SYSTEMS: Constitutional: No fevers, chills, or sweats, + generalized fatigue, change in appetite Eyes: No visual changes, double vision, eye pain Ear, nose and throat: No hearing loss, ear pain, nasal congestion, sore throat Cardiovascular: No chest pain, palpitations Respiratory:  + shortness of breath at rest or with exertion, no wheezes GastrointestinaI: No nausea, vomiting, diarrhea, abdominal pain, fecal incontinence Genitourinary:  No dysuria, urinary retention or frequency Musculoskeletal:  No neck pain, back pain Integumentary: No rash, +pruritus, no skin lesions Neurological: as above Psychiatric: + depression, no insomnia, anxiety Endocrine: No palpitations, fatigue, diaphoresis, mood swings, change in appetite, change in weight, increased thirst  Hematologic/Lymphatic:  No anemia, purpura, petechiae. Allergic/Immunologic: no itchy/runny eyes, nasal congestion, recent allergic reactions, rashes  PHYSICAL EXAM: Filed Vitals:   10/11/13 0921  BP: 96/64  Pulse: 93  Temp: 98.4 F (36.9 C)    General: No acute distress Head:  Normocephalic/atraumatic Eyes: Fundoscopic exam shows bilateral sharp discs, no vessel changes, exudates, or hemorrhages Neck: supple, no paraspinal tenderness, full range of motion Back: No paraspinal tenderness Heart: regular rate and rhythm Lungs: Clear to auscultation bilaterally. Vascular: No carotid bruits. Skin/Extremities: No rash, no edema Neurological Exam: Mental status: alert and oriented to person, place, and time, no dysarthria or aphasia, Fund of knowledge is appropriate.  Recent and remote memory are intact. 3/3 delayed recall.  Attention and concentration: had difficulty spelling WORLD backward, and could not do serial 7s (he used to be a Musician).  Able to name objects and repeat phrases. Cranial nerves: CN I: not tested CN II: pupils equal, round and reactive to light, visual fields intact, fundi unremarkable. CN III, IV, VI:  full range of motion, no nystagmus, no ptosis CN V: decreased pin on left V2-3 distribution CN VII: upper and lower face symmetric CN VIII: hearing intact to finger rub CN IX, X: gag intact, uvula midline CN XI: sternocleidomastoid and trapezius muscles intact CN XII: tongue midline Bulk & Tone: normal, no fasciculations. Motor: 5/5 throughout with no pronator drift. Sensation: decreased pin on left LE. Intact to all modalities on both UE, intact to light touch, cold, vibration and joint position sense on both LE.  Romberg test negative Deep Tendon Reflexes: +2 throughout, no ankle clonus Plantar responses: downgoing bilaterally Cerebellar: no incoordination on finger to nose testing Gait: able to stand but weak taking steps Tremor: none  IMPRESSION: This is a very pleasant 80 year old right-handed man with a history of CAD s/p CABG, cardiomyopathy s/p PPM, admitted for new onset seizures arising from the right frontal region. Head CT x 2 do not show any acute changes, however unable to obtain MRI due to  pacemaker. On admission, he was noted to have left-sided weakness and neglect, either due to possible stroke vs post-ictal Todd's paralysis.  On exam today, no further weakness noted, however he reports decreased sensation over the left face and leg. Presumably there may have been a small stroke not seen on head CT in the right frontal region causing new onset seizures.  He has been seizure-free since hospital discharge and now has side effects of fatigue and mood changes on Keppra.  An EEG will be done today, it was discussed with family that if EEG does not show significant changes, we can plan to slowly reduce Keppra to  in AM,  in PM and switch to liquid formulation due to difficulty swallowing large pills.  Continue Dilantin  qhs. If however EEG continues to be abnormal, consideration for adding a third AED such as Lamictal or Vimpat, with goal for tapering down/off Keppra will be done.  Family expressed understanding regarding risks for breakthrough seizures with any medication adjustment. Continue aspirin, control of vascular risk factors.  [place] driving laws were discussed with the patient, and he knows to stop driving after a seizure, until 6 months seizure-free. He will follow-up in 3 months.  Thank you for allowing me to participate in the care of this patient. Please do not hesitate to call for any questions or concerns.   Patrcia Dolly, M.D.  CC: [name], Dr. Amada Jupiter

## 2013-10-11 NOTE — Procedures (Signed)
ELECTROENCEPHALOGRAM REPORT  Date of Study: 10/11/2013  Patient's Name: Bill Cunningham MRN: [id number] Date of Birth: [birth year]  Referring Provider: Dr. Patrcia Dolly  Clinical History: This is an 80 year old man with new onset seizures arising from the right frontal region. Follow-up EEG  Medications: Keppra, Dilantin, aspirin, Avodart, Coreg, Lasix  Technical Summary: A multichannel digital EEG recording measured by the international 10-20 system with electrodes applied with paste and impedances below 5000 ohms performed as portable with EKG monitoring in a predominantly drowsy and asleep patient.  Hyperventilation was not performed.  Photic stimulation was performed.  The digital EEG was referentially recorded, reformatted, and digitally filtered in a variety of bipolar and referential montages for optimal display.   Description: The patient is predominantly drowsy and asleep during the recording.  During brief period of wakefulness, there is a poorly sustained 7 Hz posterior dominant rhythm that poorly attenuates to eye opening and eye closure.  This is admixed with a small amount of diffuse 5-6 Hz theta slowing of the waking background.  During drowsiness and sleep, there is an increase in theta slowing of the background.  Vertex waves and poorly formed sleep spindles were seen.  Photic stimulation did not elicit any abnormalities.  There were no epileptiform discharges or electrographic seizures seen.    EKG lead showed paced rhythm.  Impression: This predominantly drowsy and asleep EEG is abnormal due to mild diffuse slowing of the waking background.  Clinical Correlation of the above findings indicates diffuse cerebral dysfunction that is non-specific in etiology and can be seen with hypoxic/ischemic injury, toxic/metabolic encephalopathies, neurodegenerative disorders, medication effect, or excessive drowsiness.  The absence of epileptiform discharges does not rule out a clinical  diagnosis of epilepsy.  Clinical correlation is advised.   Patrcia Dolly, M.D.

## 2013-10-13 ENCOUNTER — Encounter: Payer: Self-pay | Admitting: Neurology

## 2013-10-13 DIAGNOSIS — G40209 Localization-related (focal) (partial) symptomatic epilepsy and epileptic syndromes with complex partial seizures, not intractable, without status epilepticus: Secondary | ICD-10-CM | POA: Insufficient documentation

## 2013-10-13 MED ORDER — PHENYTOIN SODIUM EXTENDED 300 MG PO CAPS: ORAL_CAPSULE | ORAL | Status: DC

## 2013-10-16 DIAGNOSIS — I442 Atrioventricular block, complete: Secondary | ICD-10-CM

## 2013-10-17 ENCOUNTER — Other Ambulatory Visit: Payer: Medicare Other

## 2013-10-22 ENCOUNTER — Inpatient Hospital Stay (HOSPITAL_COMMUNITY)
Admission: EM | Admit: 2013-10-22 | Discharge: 2013-10-25 | DRG: 698 | Disposition: A | Discharge status: Skilled Nursing Facility | Payer: Medicare Other | Attending: Internal Medicine | Admitting: Internal Medicine

## 2013-10-22 ENCOUNTER — Emergency Department (HOSPITAL_COMMUNITY): Payer: Medicare Other

## 2013-10-22 ENCOUNTER — Encounter (HOSPITAL_COMMUNITY): Payer: Self-pay | Admitting: Emergency Medicine

## 2013-10-22 DIAGNOSIS — Z7982 Long term (current) use of aspirin: Secondary | ICD-10-CM

## 2013-10-22 DIAGNOSIS — F3289 Other specified depressive episodes: Secondary | ICD-10-CM | POA: Diagnosis present

## 2013-10-22 DIAGNOSIS — R05 Cough: Secondary | ICD-10-CM

## 2013-10-22 DIAGNOSIS — Z952 Presence of prosthetic heart valve: Secondary | ICD-10-CM | POA: Diagnosis not present

## 2013-10-22 DIAGNOSIS — K219 Gastro-esophageal reflux disease without esophagitis: Secondary | ICD-10-CM | POA: Diagnosis present

## 2013-10-22 DIAGNOSIS — J4489 Other specified chronic obstructive pulmonary disease: Secondary | ICD-10-CM

## 2013-10-22 DIAGNOSIS — G929 Unspecified toxic encephalopathy: Secondary | ICD-10-CM | POA: Diagnosis present

## 2013-10-22 DIAGNOSIS — J449 Chronic obstructive pulmonary disease, unspecified: Secondary | ICD-10-CM

## 2013-10-22 DIAGNOSIS — Z66 Do not resuscitate: Secondary | ICD-10-CM | POA: Diagnosis present

## 2013-10-22 DIAGNOSIS — I509 Heart failure, unspecified: Secondary | ICD-10-CM | POA: Diagnosis present

## 2013-10-22 DIAGNOSIS — R338 Other retention of urine: Secondary | ICD-10-CM

## 2013-10-22 DIAGNOSIS — I5043 Acute on chronic combined systolic (congestive) and diastolic (congestive) heart failure: Secondary | ICD-10-CM

## 2013-10-22 DIAGNOSIS — F329 Major depressive disorder, single episode, unspecified: Secondary | ICD-10-CM | POA: Diagnosis present

## 2013-10-22 DIAGNOSIS — A498 Other bacterial infections of unspecified site: Secondary | ICD-10-CM | POA: Diagnosis present

## 2013-10-22 DIAGNOSIS — IMO0002 Reserved for concepts with insufficient information to code with codable children: Secondary | ICD-10-CM | POA: Diagnosis not present

## 2013-10-22 DIAGNOSIS — K5730 Diverticulosis of large intestine without perforation or abscess without bleeding: Secondary | ICD-10-CM

## 2013-10-22 DIAGNOSIS — G92 Toxic encephalopathy: Secondary | ICD-10-CM | POA: Diagnosis present

## 2013-10-22 DIAGNOSIS — I2510 Atherosclerotic heart disease of native coronary artery without angina pectoris: Secondary | ICD-10-CM | POA: Diagnosis present

## 2013-10-22 DIAGNOSIS — A419 Sepsis, unspecified organism: Secondary | ICD-10-CM | POA: Diagnosis present

## 2013-10-22 DIAGNOSIS — Y846 Urinary catheterization as the cause of abnormal reaction of the patient, or of later complication, without mention of misadventure at the time of the procedure: Secondary | ICD-10-CM | POA: Diagnosis present

## 2013-10-22 DIAGNOSIS — G40909 Epilepsy, unspecified, not intractable, without status epilepticus: Secondary | ICD-10-CM | POA: Diagnosis present

## 2013-10-22 DIAGNOSIS — A4151 Sepsis due to Escherichia coli [E. coli]: Secondary | ICD-10-CM | POA: Diagnosis present

## 2013-10-22 DIAGNOSIS — R059 Cough, unspecified: Secondary | ICD-10-CM

## 2013-10-22 DIAGNOSIS — I2589 Other forms of chronic ischemic heart disease: Secondary | ICD-10-CM | POA: Diagnosis present

## 2013-10-22 DIAGNOSIS — E43 Unspecified severe protein-calorie malnutrition: Secondary | ICD-10-CM | POA: Diagnosis present

## 2013-10-22 DIAGNOSIS — R569 Unspecified convulsions: Secondary | ICD-10-CM

## 2013-10-22 DIAGNOSIS — R339 Retention of urine, unspecified: Secondary | ICD-10-CM

## 2013-10-22 DIAGNOSIS — E785 Hyperlipidemia, unspecified: Secondary | ICD-10-CM

## 2013-10-22 DIAGNOSIS — T83511A Infection and inflammatory reaction due to indwelling urethral catheter, initial encounter: Principal | ICD-10-CM | POA: Diagnosis present

## 2013-10-22 DIAGNOSIS — N138 Other obstructive and reflux uropathy: Secondary | ICD-10-CM

## 2013-10-22 DIAGNOSIS — I5022 Chronic systolic (congestive) heart failure: Secondary | ICD-10-CM

## 2013-10-22 DIAGNOSIS — G40209 Localization-related (focal) (partial) symptomatic epilepsy and epileptic syndromes with complex partial seizures, not intractable, without status epilepticus: Secondary | ICD-10-CM

## 2013-10-22 DIAGNOSIS — I739 Peripheral vascular disease, unspecified: Secondary | ICD-10-CM

## 2013-10-22 DIAGNOSIS — Z79899 Other long term (current) drug therapy: Secondary | ICD-10-CM | POA: Diagnosis not present

## 2013-10-22 DIAGNOSIS — N183 Chronic kidney disease, stage 3 unspecified: Secondary | ICD-10-CM

## 2013-10-22 DIAGNOSIS — Z954 Presence of other heart-valve replacement: Secondary | ICD-10-CM

## 2013-10-22 DIAGNOSIS — N400 Enlarged prostate without lower urinary tract symptoms: Secondary | ICD-10-CM

## 2013-10-22 DIAGNOSIS — I10 Essential (primary) hypertension: Secondary | ICD-10-CM | POA: Diagnosis present

## 2013-10-22 DIAGNOSIS — E871 Hypo-osmolality and hyponatremia: Secondary | ICD-10-CM | POA: Diagnosis present

## 2013-10-22 DIAGNOSIS — I6529 Occlusion and stenosis of unspecified carotid artery: Secondary | ICD-10-CM | POA: Diagnosis present

## 2013-10-22 DIAGNOSIS — Z953 Presence of xenogenic heart valve: Secondary | ICD-10-CM

## 2013-10-22 DIAGNOSIS — N390 Urinary tract infection, site not specified: Secondary | ICD-10-CM | POA: Diagnosis present

## 2013-10-22 DIAGNOSIS — R58 Hemorrhage, not elsewhere classified: Secondary | ICD-10-CM

## 2013-10-22 DIAGNOSIS — N401 Enlarged prostate with lower urinary tract symptoms: Secondary | ICD-10-CM

## 2013-10-22 DIAGNOSIS — R1310 Dysphagia, unspecified: Secondary | ICD-10-CM | POA: Diagnosis present

## 2013-10-22 DIAGNOSIS — Z950 Presence of cardiac pacemaker: Secondary | ICD-10-CM

## 2013-10-22 DIAGNOSIS — R9430 Abnormal result of cardiovascular function study, unspecified: Secondary | ICD-10-CM

## 2013-10-22 DIAGNOSIS — I255 Ischemic cardiomyopathy: Secondary | ICD-10-CM

## 2013-10-22 DIAGNOSIS — Z87891 Personal history of nicotine dependence: Secondary | ICD-10-CM

## 2013-10-22 DIAGNOSIS — R0609 Other forms of dyspnea: Secondary | ICD-10-CM

## 2013-10-22 DIAGNOSIS — Z951 Presence of aortocoronary bypass graft: Secondary | ICD-10-CM | POA: Diagnosis not present

## 2013-10-22 DIAGNOSIS — N12 Tubulo-interstitial nephritis, not specified as acute or chronic: Secondary | ICD-10-CM | POA: Diagnosis present

## 2013-10-22 DIAGNOSIS — R509 Fever, unspecified: Secondary | ICD-10-CM | POA: Diagnosis present

## 2013-10-22 DIAGNOSIS — I447 Left bundle-branch block, unspecified: Secondary | ICD-10-CM

## 2013-10-22 DIAGNOSIS — I779 Disorder of arteries and arterioles, unspecified: Secondary | ICD-10-CM | POA: Diagnosis present

## 2013-10-22 HISTORY — DX: Calculus of kidney: N20.0

## 2013-10-22 LAB — CBC WITH DIFFERENTIAL/PLATELET
BASOS ABS: 0 10*3/uL (ref 0.0–0.1)
BASOS PCT: 0 % (ref 0–1)
Eosinophils Absolute: 0 10*3/uL (ref 0.0–0.7)
Eosinophils Relative: 0 % (ref 0–5)
HCT: 40 % (ref 39.0–52.0)
Hemoglobin: 13.5 g/dL (ref 13.0–17.0)
Lymphocytes Relative: 16 % (ref 12–46)
Lymphs Abs: 1.8 10*3/uL (ref 0.7–4.0)
MCH: 29.2 pg (ref 26.0–34.0)
MCHC: 33.8 g/dL (ref 30.0–36.0)
MCV: 86.4 fL (ref 78.0–100.0)
MONO ABS: 1.3 10*3/uL — AB (ref 0.1–1.0)
Monocytes Relative: 11 % (ref 3–12)
NEUTROS ABS: 8 10*3/uL — AB (ref 1.7–7.7)
NEUTROS PCT: 73 % (ref 43–77)
Platelets: 226 10*3/uL (ref 150–400)
RBC: 4.63 MIL/uL (ref 4.22–5.81)
RDW: 14.4 % (ref 11.5–15.5)
WBC: 11.1 10*3/uL — ABNORMAL HIGH (ref 4.0–10.5)

## 2013-10-22 LAB — URINALYSIS, ROUTINE W REFLEX MICROSCOPIC
Glucose, UA: NEGATIVE mg/dL
Ketones, ur: NEGATIVE mg/dL
NITRITE: NEGATIVE
PROTEIN: 100 mg/dL — AB
SPECIFIC GRAVITY, URINE: 1.02 (ref 1.005–1.030)
UROBILINOGEN UA: 0.2 mg/dL (ref 0.0–1.0)
pH: 5.5 (ref 5.0–8.0)

## 2013-10-22 LAB — COMPREHENSIVE METABOLIC PANEL
ALBUMIN: 3.7 g/dL (ref 3.5–5.2)
ALT: 55 U/L — ABNORMAL HIGH (ref 0–53)
AST: 41 U/L — ABNORMAL HIGH (ref 0–37)
Alkaline Phosphatase: 108 U/L (ref 39–117)
Anion gap: 19 — ABNORMAL HIGH (ref 5–15)
BUN: 21 mg/dL (ref 6–23)
CO2: 22 mEq/L (ref 19–32)
CREATININE: 1.15 mg/dL (ref 0.50–1.35)
Calcium: 9.3 mg/dL (ref 8.4–10.5)
Chloride: 88 mEq/L — ABNORMAL LOW (ref 96–112)
GFR calc Af Amer: 63 mL/min — ABNORMAL LOW (ref >90)
GFR calc non Af Amer: 54 mL/min — ABNORMAL LOW (ref >90)
Glucose, Bld: 79 mg/dL (ref 70–99)
POTASSIUM: 4.3 meq/L (ref 3.7–5.3)
Sodium: 129 mEq/L — ABNORMAL LOW (ref 137–147)
Total Bilirubin: 0.8 mg/dL (ref 0.3–1.2)
Total Protein: 7 g/dL (ref 6.0–8.3)

## 2013-10-22 LAB — PROTIME-INR
INR: 1.27 (ref 0.00–1.49)
PROTHROMBIN TIME: 15.9 s — AB (ref 11.6–15.2)

## 2013-10-22 LAB — URINE MICROSCOPIC-ADD ON

## 2013-10-22 LAB — PRO B NATRIURETIC PEPTIDE: PRO B NATRI PEPTIDE: 33814 pg/mL — AB (ref 0–450)

## 2013-10-22 LAB — TROPONIN I: Troponin I: <0.3 ng/mL (ref <0.30)

## 2013-10-22 LAB — I-STAT CG4 LACTIC ACID, ED: LACTIC ACID, VENOUS: 1.28 mmol/L (ref 0.5–2.2)

## 2013-10-22 MED ORDER — SODIUM CHLORIDE 0.9 % IJ SOLN
3.0000 mL | Freq: Two times a day (BID) | INTRAMUSCULAR | Status: DC
  Administered 2013-10-23 – 2013-10-25 (×5): 3 mL via INTRAVENOUS

## 2013-10-22 MED ORDER — DEXTROSE 5 % IV SOLN
1.0000 g | Freq: Once | INTRAVENOUS | Status: AC
  Administered 2013-10-22: 1 g via INTRAVENOUS
  Filled 2013-10-22: qty 10

## 2013-10-22 MED ORDER — CEFTRIAXONE SODIUM 1 G IJ SOLR
1.0000 g | INTRAMUSCULAR | Status: DC
  Administered 2013-10-23 – 2013-10-24 (×2): 1 g via INTRAVENOUS
  Filled 2013-10-22 (×3): qty 10

## 2013-10-22 MED ORDER — LEVETIRACETAM IN NACL 1000 MG/100ML IV SOLN
1000.0000 mg | INTRAVENOUS | Status: DC
  Administered 2013-10-23: 1000 mg via INTRAVENOUS
  Filled 2013-10-22: qty 100

## 2013-10-22 MED ORDER — LEVETIRACETAM IN NACL 1500 MG/100ML IV SOLN: 1500.0000 mg | INTRAVENOUS | Status: DC

## 2013-10-22 MED ORDER — LEVETIRACETAM IN NACL 1000 MG/100ML IV SOLN: 1000.0000 mg | INTRAVENOUS | Status: DC

## 2013-10-22 MED ORDER — PANTOPRAZOLE SODIUM 40 MG IV SOLR
40.0000 mg | Freq: Two times a day (BID) | INTRAVENOUS | Status: DC
  Administered 2013-10-22 – 2013-10-23 (×3): 40 mg via INTRAVENOUS
  Filled 2013-10-22 (×5): qty 40

## 2013-10-22 MED ORDER — ENOXAPARIN SODIUM 40 MG/0.4ML ~~LOC~~ SOLN
40.0000 mg | SUBCUTANEOUS | Status: DC
  Administered 2013-10-22 – 2013-10-24 (×3): 40 mg via SUBCUTANEOUS
  Filled 2013-10-22 (×4): qty 0.4

## 2013-10-22 MED ORDER — ACETAMINOPHEN 325 MG PO TABS: 650.0000 mg | ORAL_TABLET | Freq: Once | ORAL | Status: DC

## 2013-10-22 MED ORDER — SIMVASTATIN 40 MG PO TABS
40.0000 mg | ORAL_TABLET | Freq: Every day | ORAL | Status: DC
  Administered 2013-10-22 – 2013-10-24 (×3): 40 mg via ORAL
  Filled 2013-10-22 (×4): qty 1

## 2013-10-22 MED ORDER — PHENYTOIN 125 MG/5ML PO SUSP
300.0000 mg | Freq: Every day | ORAL | Status: DC
  Administered 2013-10-22: 300 mg via ORAL
  Filled 2013-10-22 (×2): qty 12

## 2013-10-22 MED ORDER — LEVETIRACETAM IN NACL 1500 MG/100ML IV SOLN
1500.0000 mg | INTRAVENOUS | Status: DC
  Administered 2013-10-22: 1500 mg via INTRAVENOUS
  Filled 2013-10-22: qty 100

## 2013-10-22 MED ORDER — SODIUM CHLORIDE 0.9 % IV BOLUS (SEPSIS): 250.0000 mL | INTRAVENOUS | Status: DC | PRN

## 2013-10-22 MED ORDER — ASPIRIN EC 81 MG PO TBEC
81.0000 mg | DELAYED_RELEASE_TABLET | Freq: Every day | ORAL | Status: DC
  Administered 2013-10-22 – 2013-10-24 (×3): 81 mg via ORAL
  Filled 2013-10-22 (×4): qty 1

## 2013-10-22 MED ORDER — ACETAMINOPHEN 325 MG PO TABS
650.0000 mg | ORAL_TABLET | Freq: Four times a day (QID) | ORAL | Status: DC | PRN
  Administered 2013-10-22: 650 mg via ORAL
  Filled 2013-10-22: qty 2

## 2013-10-22 NOTE — ED Provider Notes (Signed)
CSN: [id number]     Arrival date & time 10/22/13  1120 History   First MD Initiated Contact with Patient 10/22/13 1208     Chief Complaint  Patient presents with  . Altered Mental Status     (Consider location/radiation/quality/duration/timing/severity/associated sxs/prior Treatment) HPI 80 year old male presents with confusion over the last couple days. The patient not been quite right having a seizure one to 2 months ago but became more altered over the last couple days. Has not been having fevers. Chronically has a Foley catheter with a leg bag over the last year. Is this to get it changed like normal and a couple days. The patient not having any vomiting. He has not fallen or hit his head. One time he slid down off the bed but was seen not hitting his head. Daughter notes that he is also had increased leg swelling and she is worried that his CHF is worse.  Past Medical History  Diagnosis Date  . Hyperlipidemia   . LBBB (left bundle branch block)   . Depression   . COPD (chronic obstructive pulmonary disease)   . Calcium oxalate renal stones   . Dizziness   . GERD (gastroesophageal reflux disease)   . Diverticulosis   . IBS (irritable bowel syndrome)   . Leg cramps     not vascular in origin   . Liver cyst   . Wide-complex tachycardia     post op - on a beta blocker   . Sinus congestion   . Bleeding nose     cauteriszed dr Lazarus Salines  / cauterization repeated 10/11  . Coronary artery disease     Nuclear June 03, 2010, scar anterior wall apex and inferior wall, no definite ischemia,    not gated  . Intolerance of drug     Niaspan  . S/P aortic valve replacement with bioprosthetic valve     echo.[name] April, 2011... good valve function... mild AI  . S/P CABG (coronary artery bypass graft) January, 2007  . LV dysfunction     EF 45% / EF 25-30%, echo, April, 2011 / EF 25-30%, echo, April, 2012, akinesis of the inferior and posterior walls  . Ischemic cardiomyopathy     EF followed  carefully  . Carotid artery disease     Doppler, 2011, bilateral 40-59%    /   Doppler, March, 2013, no change, bilateral 40-59%  . BPH (benign prostatic hyperplasia)     2013  . Cough     January, 2014  . Ejection fraction < 50%    Past Surgical History  Procedure Laterality Date  . Cholecystectomy    . Aortic valve replacement  02/2005  . Insert / replace / remove pacemaker      must call rep to interrogate pacemaker [phone], [employer], older model  . Coronary artery bypass graft      x5  -- last one in 07  . Lithotripsy    . Mosaic ultra porcine heart valve      medtronic,  [id number], 03/02/2005, MD clarence Cornelius Moras [phone], device questions [phone]   No family history on file. History  Substance Use Topics  . Smoking status: Former Smoker    Quit date: 02/08/1954  . Smokeless tobacco: Not on file  . Alcohol Use: No    Review of Systems  Unable to perform ROS: Mental status change      Allergies  Niaspan and Sulfonamide derivatives  Home Medications   Prior to Admission medications  Medication Sig Start Date End Date Taking? Authorizing Provider  aspirin EC 81 MG tablet Take 81 mg by mouth at bedtime.    Yes Historical Provider, MD  AVODART 0.5 MG capsule Take 0.5 mg by mouth every evening.  09/28/11  Yes Historical Provider, MD  carvedilol (COREG) 3.125 MG tablet Take 3.125 mg by mouth 2 (two) times daily with a meal.   Yes Historical Provider, MD  docusate sodium (COLACE) 100 MG capsule Take 100 mg by mouth at bedtime as needed for mild constipation.    Yes Historical Provider, MD  fexofenadine (ALLEGRA) 60 MG tablet Take 60 mg by mouth daily.   Yes Historical Provider, MD  furosemide (LASIX) 40 MG tablet Take 60 mg by mouth daily.   Yes Historical Provider, MD  levETIRAcetam (KEPPRA) 100 MG/ML solution Take 1,000-1,500 mg by mouth 2 (two) times daily. 1000 mg every morning and 1500 every evening   Yes Historical Provider, MD  Melatonin 3 MG CAPS  Take 3 mg by mouth at bedtime as needed (sleep).   Yes Historical Provider, MD  Multiple Vitamin (MULTIVITAMIN) tablet Take 1 tablet by mouth daily.     Yes Historical Provider, MD  nitroGLYCERIN (NITROSTAT) 0.4 MG SL tablet Place 0.4 mg under the tongue every 5 (five) minutes as needed for chest pain.   Yes Historical Provider, MD  omeprazole (PRILOSEC) 20 MG capsule Take 20 mg by mouth daily.   Yes Historical Provider, MD  phenytoin (DILANTIN) 300 MG ER capsule Take 300 mg by mouth at bedtime.   Yes Historical Provider, MD  potassium chloride (K-DUR) 10 MEQ tablet Take 10 mEq by mouth daily.   Yes Historical Provider, MD  simvastatin (ZOCOR) 40 MG tablet Take 40 mg by mouth daily.   Yes Historical Provider, MD  tamsulosin (FLOMAX) 0.4 MG CAPS capsule Take 0.8 mg by mouth at bedtime.   Yes Historical Provider, MD  Wheat Dextrin (BENEFIBER) POWD Take 1 scoop by mouth daily.    Yes Historical Provider, MD  Zinc 50 MG TABS Take 50 mg by mouth daily after breakfast.    Yes Historical Provider, MD   BP 128/7  Pulse 95  Temp(Src) 101 F (38.3 C) (Rectal)  Resp 20  SpO2 100% Physical Exam  Nursing note and vitals reviewed. Constitutional: He appears well-developed and well-nourished.  HENT:  Head: Normocephalic and atraumatic.  Right Ear: External ear normal.  Left Ear: External ear normal.  Nose: Nose normal.  Eyes: Pupils are equal, round, and reactive to light. Right eye exhibits no discharge. Left eye exhibits no discharge.  Neck: Neck supple.  Cardiovascular: Normal rate, regular rhythm, normal heart sounds and intact distal pulses.   Pulmonary/Chest: Effort normal.  Abdominal: Soft. He exhibits no distension. There is no tenderness.  Genitourinary: Testes normal and penis normal.  Foley catheter in place in penis  Musculoskeletal: He exhibits no edema.  Neurological: He is alert. He is disoriented.  Normal strength in all 4 extremities.  Skin: Skin is warm and dry.    ED Course   Procedures (including critical care time) Labs Review Labs Reviewed  CBC WITH DIFFERENTIAL - Abnormal; Notable for the following:    WBC 11.1 (*)    Neutro Abs 8.0 (*)    Monocytes Absolute 1.3 (*)    All other components within normal limits  COMPREHENSIVE METABOLIC PANEL - Abnormal; Notable for the following:    Sodium 129 (*)    Chloride 88 (*)    AST 41 (*)  ALT 55 (*)    GFR calc non Af Amer 54 (*)    GFR calc Af Amer 63 (*)    Anion gap 19 (*)    All other components within normal limits  PROTIME-INR - Abnormal; Notable for the following:    Prothrombin Time 15.9 (*)    All other components within normal limits  PRO B NATRIURETIC PEPTIDE - Abnormal; Notable for the following:    Pro B Natriuretic peptide (BNP) 33814.0 (*)    All other components within normal limits  URINALYSIS, ROUTINE W REFLEX MICROSCOPIC - Abnormal; Notable for the following:    Color, Urine AMBER (*)    APPearance TURBID (*)    Hgb urine dipstick LARGE (*)    Bilirubin Urine SMALL (*)    Protein, ur 100 (*)    Leukocytes, UA LARGE (*)    All other components within normal limits  URINE MICROSCOPIC-ADD ON - Abnormal; Notable for the following:    Bacteria, UA MANY (*)    Casts HYALINE CASTS (*)    All other components within normal limits  CULTURE, BLOOD (ROUTINE X 2)  CULTURE, BLOOD (ROUTINE X 2)  URINE CULTURE  I-STAT CG4 LACTIC ACID, ED    Imaging Review Dg Chest Portable 1 View  10/22/2013   CLINICAL DATA:  Altered mental status, weakness  EXAM: PORTABLE CHEST - 1 VIEW  COMPARISON:  10/03/2013  FINDINGS: Moderate enlargement of the cardiac silhouette is reidentified with evidence of CABG. Left-sided dual lead pacer in place. Small bilateral pleural effusions are identified with interstitial Kerley B-lines and central vascular congestion compatible with early interstitial edema. Calcified pleural plaques are reidentified.  IMPRESSION: Early interstitial edema and small pleural effusions.    Electronically Signed   By: Christiana Pellant M.D.   On: 10/22/2013 13:15     EKG Interpretation None      MDM   Final diagnoses:  Urinary tract infection associated with catheterization of urinary tract, initial encounter    Patient's AMS appears to be coming from UTI given fever and chronic foley. Given rocephin after blood and urine cultures. Not given fluids due to normal blood pressure and apparent fluid overload. Stable for admission to floor at this time to hospitalist.     Audree Camel, MD 10/22/13 [phone]

## 2013-10-22 NOTE — ED Notes (Signed)
Bed: WA19 Expected date:  Expected time:  Means of arrival:  Comments: EMS- AMS 

## 2013-10-22 NOTE — H&P (Signed)
[facility] History and Physical  Bill Cunningham ZOX:[id number] DOB: [birth year] DOA: 10/22/2013  Referring physician:  Pricilla Loveless PCP:  Beverley Fiedler, MD   Chief Complaint:  AMS, weakness  HPI:  The patient is a 80 y.o. year-old male with history of coronary artery disease status post CABG, aortic valve replacement, ischemic cardiomyopathy with ejection fraction of 25-30%, wide-complex tachycardia status post pacemaker, carotid artery disease, kidney stones, BPH with chronic indwelling Foley, GERD, recently diagnosed seizure disorder who presents with fever, poor oral intake, and progressive weakness over the last 3 days.  He was independent up until a month and a half ago when he developed seizure disorder. He was hospitalized and started on antiepileptic medications. Since that time, he has been sleepy and somewhat weak. He was receiving home health services and started using a walker. His thinking has been less clear and he has had some sundowning. His wife and his daughter have been taking care of him. Over the last 3 days, he developed fevers, increased confusion, weakness requiring additional assistance to get out of bed. He refused to eat, drink, or take his medications for the last 2 days. He did not receive his Lasix yesterday or today and he has developed some ankle swelling. His family brought him to the emergency department today because of concern for urinary tract infection and confusion. He has a history of tract infections related to his indwelling Foley catheter, and his catheter was supposed to be replaced 2 days from now.  In the emergency department, his temperature 101 Fahrenheit, pulse 70s, blood pressure 80 to low 100 systolic, white blood cell count 11, sodium 129, chloride 88. Urinalysis concerning for urinary tract infection with large leukocyte esterase, 21-50 WBC, 11-20 RBCs, many bacteria. His Foley catheter was exchanged by the emergency department physician. He  did not receive IV fluids secondary to lower extremity edema, presence of small effusions and interstitial edema on chest x-ray. He was given ceftriaxone and is being admitted for urinary tract infection and delirium. His family is requesting placement at [facility] possible at the time of discharge.  Review of Systems:  Patient is confused but able to answer a limited review of systems. He denies active pain, shortness of breath, and nausea. He states he feels comfortable.  He is unable to contribute further to history.   Past Medical History  Diagnosis Date  . Hyperlipidemia   . LBBB (left bundle branch block)   . Depression   . COPD (chronic obstructive pulmonary disease)   . Calcium oxalate renal stones   . Dizziness   . GERD (gastroesophageal reflux disease)   . Diverticulosis   . IBS (irritable bowel syndrome)   . Leg cramps     not vascular in origin   . Liver cyst   . Wide-complex tachycardia     post op - on a beta blocker   . Sinus congestion   . Bleeding nose     cauteriszed dr Lazarus Salines  / cauterization repeated 10/11  . Coronary artery disease     Nuclear June 03, 2010, scar anterior wall apex and inferior wall, no definite ischemia,    not gated  . Intolerance of drug     Niaspan  . S/P aortic valve replacement with bioprosthetic valve     echo.[name] April, 2011... good valve function... mild AI  . S/P CABG (coronary artery bypass graft) January, 2007  . LV dysfunction     EF 45% / EF 25-30%, echo, April,  2011 / EF 25-30%, echo, April, 2012, akinesis of the inferior and posterior walls  . Ischemic cardiomyopathy     EF followed carefully  . Carotid artery disease     Doppler, 2011, bilateral 40-59%    /   Doppler, March, 2013, no change, bilateral 40-59%  . BPH (benign prostatic hyperplasia)     2013  . Cough     January, 2014  . Ejection fraction < 50%   . Kidney stones    Past Surgical History  Procedure Laterality Date  . Cholecystectomy    . Aortic valve  replacement  02/2005  . Insert / replace / remove pacemaker      must call rep to interrogate pacemaker [id number], [employer], older model  . Coronary artery bypass graft      x5  -- last one in 07  . Lithotripsy    . Mosaic ultra porcine heart valve      medtronic,  [id number], 03/02/2005, MD clarence Cornelius Moras [phone], device questions [phone]   Social History:  reports that he quit smoking about 59 years ago. He has never used smokeless tobacco. He reports that he does not drink alcohol or use illicit drugs. Lives at home and family cares for him.  Up until seizures he was very independent.  Home with walker and HH PT.     Allergies  Allergen Reactions  . Niaspan [Niacin Er] Other (See Comments)    Unknown reaction  . Sulfonamide Derivatives Other (See Comments)    Unknown reaction    Family History  Problem Relation Age of Onset  . Heart attack Father 81    died of MI at 69 yo  . Kidney Stones Neg Hx   . Seizures Neg Hx      Prior to Admission medications   Medication Sig Start Date End Date Taking? Authorizing Provider  aspirin EC 81 MG tablet Take 81 mg by mouth at bedtime.    Yes Historical Provider, MD  AVODART 0.5 MG capsule Take 0.5 mg by mouth every evening.  09/28/11  Yes Historical Provider, MD  carvedilol (COREG) 3.125 MG tablet Take 3.125 mg by mouth 2 (two) times daily with a meal.   Yes Historical Provider, MD  docusate sodium (COLACE) 100 MG capsule Take 100 mg by mouth at bedtime as needed for mild constipation.    Yes Historical Provider, MD  fexofenadine (ALLEGRA) 60 MG tablet Take 60 mg by mouth daily.   Yes Historical Provider, MD  furosemide (LASIX) 40 MG tablet Take 60 mg by mouth daily.   Yes Historical Provider, MD  levETIRAcetam (KEPPRA) 100 MG/ML solution Take 1,000-1,500 mg by mouth 2 (two) times daily. 1000 mg every morning and 1500 every evening   Yes Historical Provider, MD  Melatonin 3 MG CAPS Take 3 mg by mouth at bedtime as needed  (sleep).   Yes Historical Provider, MD  Multiple Vitamin (MULTIVITAMIN) tablet Take 1 tablet by mouth daily.     Yes Historical Provider, MD  nitroGLYCERIN (NITROSTAT) 0.4 MG SL tablet Place 0.4 mg under the tongue every 5 (five) minutes as needed for chest pain.   Yes Historical Provider, MD  omeprazole (PRILOSEC) 20 MG capsule Take 20 mg by mouth daily.   Yes Historical Provider, MD  phenytoin (DILANTIN) 300 MG ER capsule Take 300 mg by mouth at bedtime.   Yes Historical Provider, MD  potassium chloride (K-DUR) 10 MEQ tablet Take 10 mEq by mouth daily.   Yes Historical Provider,  MD  simvastatin (ZOCOR) 40 MG tablet Take 40 mg by mouth daily.   Yes Historical Provider, MD  tamsulosin (FLOMAX) 0.4 MG CAPS capsule Take 0.8 mg by mouth at bedtime.   Yes Historical Provider, MD  Wheat Dextrin (BENEFIBER) POWD Take 1 scoop by mouth daily.    Yes Historical Provider, MD  Zinc 50 MG TABS Take 50 mg by mouth daily after breakfast.    Yes Historical Provider, MD   Physical Exam: Filed Vitals:   10/22/13 1337 10/22/13 1523 10/22/13 1525 10/22/13 1600  BP: 97/57 107/61  98/55  Pulse: 86  89 71  Temp:      TempSrc:      Resp: SpO2: 100%  100% 97%     General:  Thin white male, frail appearing, asleep easily arousable. Heart appearing.  Eyes:  PERRL, anicteric, non-injected.  ENT:  Nares clear.  OP clear, non-erythematous without plaques or exudates.  MMM.  Neck:  Supple without TM or JVD.    Lymph:  No cervical, supraclavicular, or submandibular LAD.  Cardiovascular:  RRR, normal S1, slurred S2 with possible gallop, no obvious murmurs,  2+ pulses, warm extremities  Respiratory:  CTA bilaterally anteriorly without increased WOB.  Abdomen:  NABS.  Soft, ND/NT.    Skin:  No rashes or focal lesions.  Musculoskeletal:  Normal bulk and tone.  1+ bilateral pitting LE edema.  Psychiatric:  A & O to person, place, but not time.  Neurologic:  CN 3-12 grossly intact.  Moves all  extremities spontaneously  Labs on Admission:  Basic Metabolic Panel:  Recent Labs Lab 10/22/13 1500  NA 129*  K 4.3  CL 88*  CO2 22  GLUCOSE 79  BUN 21  CREATININE 1.15  CALCIUM 9.3   Liver Function Tests:  Recent Labs Lab 10/22/13 1500  AST 41*  ALT 55*  ALKPHOS 108  BILITOT 0.8  PROT 7.0  ALBUMIN 3.7   No results found for this basename: LIPASE, AMYLASE,  in the last 168 hours No results found for this basename: AMMONIA,  in the last 168 hours CBC:  Recent Labs Lab 10/22/13 1500  WBC 11.1*  NEUTROABS 8.0*  HGB 13.5  HCT 40.0  MCV 86.4  PLT 226   Cardiac Enzymes: No results found for this basename: CKTOTAL, CKMB, CKMBINDEX, TROPONINI,  in the last 168 hours  BNP (last 3 results)  Recent Labs  07/08/13 0945 08/29/13 0649 10/22/13 1339  PROBNP 9136.0* 8114.0* 33814.0*   CBG: No results found for this basename: GLUCAP,  in the last 168 hours  Radiological Exams on Admission: Dg Chest Portable 1 View  10/22/2013   CLINICAL DATA:  Altered mental status, weakness  EXAM: PORTABLE CHEST - 1 VIEW  COMPARISON:  10/03/2013  FINDINGS: Moderate enlargement of the cardiac silhouette is reidentified with evidence of CABG. Left-sided dual lead pacer in place. Small bilateral pleural effusions are identified with interstitial Kerley B-lines and central vascular congestion compatible with early interstitial edema. Calcified pleural plaques are reidentified.  IMPRESSION: Early interstitial edema and small pleural effusions.   Electronically Signed   By: Christiana Pellant M.D.   On: 10/22/2013 13:15    EKG: pending  Assessment/Plan Active Problems:   UTI (urinary tract infection)  ---  Severe sepsis (fever, tachycardia, low blood pressure) due to pyelonephritis/CAUTI due to indwelling urinary catheter present at time of admission.  Catheter exchanged by ER MD -  Ns boluses as needed to keep SBP >  90 -  Lactic acid 1.28 -  Hold BP medications -  F/u BCx -  F/u  UCx -  Continue ceftriaxone -  Hold flomax and avodart  Metabolic encephalopathy/delirium due likely due UTI -  tx UTI as above -  Avoid sedating medications -  Frequent reorientation -  Falls precautions  Generalized weakness due to heart failure and acute illness -  PT/OT evaluations -  SW consult for SNF placement  Hyponatremia, likely due to poor oral intake + heart failure -  IVF prn low BP -  Resume lasix when BP tolerates -  Repeat BMP in AM  Dysphagia and poor oral intake -  SLP consult -  Dysphagia 2 with thin liquids for now -  PPI -  Nutrition consultation  CAD s/p CABG, continue ASA & statin -  Hold other CV medications for now due to severe illness and hypotension  Acute on chronic systolic heart failure due to ischemic cardiomyopathy -  Hold lasix for now due to hypotension from sepsis -  Hold ACEI and BB  -  Telemetry -  Cycle troponins   Seizure disorder -  Seizure precautions -  Resume keppra and dilantin  Diet:  Dysphagia 2 with thin Access:  PIV IVF:  off Proph:  lovenox  Code Status: DNR Family Communication: patient, his wife, sister Disposition Plan: Admit to telemetry  Time spent: 60 min Renae Fickle [facility] Pager [phone]  If 7PM-7AM, please contact night-coverage [email or web address] Password [facility] 10/22/2013, 4:48 PM

## 2013-10-22 NOTE — ED Notes (Signed)
Per EMS, pt's family state the pt has been acting more confused for the past 3 days, which worsens at night. Pt has not been diagnosed with dementia. Pt has leg bag and hx of UTI's, which the family states has caused him to be confused in the past. Pt alert to self. Denies pain.

## 2013-10-23 DIAGNOSIS — I5043 Acute on chronic combined systolic (congestive) and diastolic (congestive) heart failure: Secondary | ICD-10-CM

## 2013-10-23 DIAGNOSIS — E43 Unspecified severe protein-calorie malnutrition: Secondary | ICD-10-CM

## 2013-10-23 LAB — CBC
HCT: 34 % — ABNORMAL LOW (ref 39.0–52.0)
HEMOGLOBIN: 11.4 g/dL — AB (ref 13.0–17.0)
MCH: 28.9 pg (ref 26.0–34.0)
MCHC: 33.5 g/dL (ref 30.0–36.0)
MCV: 86.1 fL (ref 78.0–100.0)
Platelets: 191 10*3/uL (ref 150–400)
RBC: 3.95 MIL/uL — ABNORMAL LOW (ref 4.22–5.81)
RDW: 14.1 % (ref 11.5–15.5)
WBC: 8.7 10*3/uL (ref 4.0–10.5)

## 2013-10-23 LAB — TROPONIN I: Troponin I: <0.3 ng/mL (ref <0.30)

## 2013-10-23 LAB — BASIC METABOLIC PANEL
Anion gap: 13 (ref 5–15)
BUN: 22 mg/dL (ref 6–23)
CALCIUM: 8.5 mg/dL (ref 8.4–10.5)
CHLORIDE: 96 meq/L (ref 96–112)
CO2: 26 mEq/L (ref 19–32)
CREATININE: 1.17 mg/dL (ref 0.50–1.35)
GFR calc Af Amer: 62 mL/min — ABNORMAL LOW (ref >90)
GFR calc non Af Amer: 53 mL/min — ABNORMAL LOW (ref >90)
GLUCOSE: 69 mg/dL — AB (ref 70–99)
Potassium: 4 mEq/L (ref 3.7–5.3)
Sodium: 135 mEq/L — ABNORMAL LOW (ref 137–147)

## 2013-10-23 LAB — GLUCOSE, CAPILLARY: Glucose-Capillary: 90 mg/dL (ref 70–99)

## 2013-10-23 MED ORDER — DEXTROSE 50 % IV SOLN: 50.0000 mL | Freq: Once | INTRAVENOUS | Status: AC | PRN

## 2013-10-23 MED ORDER — TAMSULOSIN HCL 0.4 MG PO CAPS
0.8000 mg | ORAL_CAPSULE | Freq: Every day | ORAL | Status: DC
  Administered 2013-10-23 – 2013-10-24 (×2): 0.8 mg via ORAL
  Filled 2013-10-23 (×3): qty 2

## 2013-10-23 MED ORDER — BOOST PLUS PO LIQD
237.0000 mL | Freq: Two times a day (BID) | ORAL | Status: DC
  Administered 2013-10-23 – 2013-10-25 (×4): 237 mL via ORAL
  Filled 2013-10-23 (×5): qty 237

## 2013-10-23 MED ORDER — DEXTROSE 50 % IV SOLN: 25.0000 mL | Freq: Once | INTRAVENOUS | Status: AC | PRN

## 2013-10-23 MED ORDER — DUTASTERIDE 0.5 MG PO CAPS
0.5000 mg | ORAL_CAPSULE | Freq: Every day | ORAL | Status: DC
  Administered 2013-10-23 – 2013-10-25 (×3): 0.5 mg via ORAL
  Filled 2013-10-23 (×3): qty 1

## 2013-10-23 MED ORDER — DUTASTERIDE 0.5 MG PO CAPS
0.5000 mg | ORAL_CAPSULE | Freq: Every day | ORAL | Status: DC
  Filled 2013-10-23: qty 1

## 2013-10-23 MED ORDER — LEVETIRACETAM 100 MG/ML PO SOLN
1500.0000 mg | Freq: Every day | ORAL | Status: DC
  Administered 2013-10-24 (×2): 1500 mg via ORAL
  Filled 2013-10-23 (×4): qty 15

## 2013-10-23 MED ORDER — PHENYTOIN 125 MG/5ML PO SUSP
100.0000 mg | Freq: Three times a day (TID) | ORAL | Status: DC
  Administered 2013-10-23 – 2013-10-25 (×5): 100 mg via ORAL
  Filled 2013-10-23 (×7): qty 4

## 2013-10-23 MED ORDER — LEVETIRACETAM 100 MG/ML PO SOLN
1000.0000 mg | Freq: Every day | ORAL | Status: DC
  Administered 2013-10-23 – 2013-10-25 (×3): 1000 mg via ORAL
  Filled 2013-10-23 (×3): qty 10

## 2013-10-23 MED ORDER — LORATADINE 10 MG PO TABS
10.0000 mg | ORAL_TABLET | Freq: Every day | ORAL | Status: DC
  Administered 2013-10-23 – 2013-10-25 (×3): 10 mg via ORAL
  Filled 2013-10-23 (×3): qty 1

## 2013-10-23 NOTE — Progress Notes (Signed)
Noticed that pt's lab glucose was 69; pt asymptomatic. Gave pt 4 oz of orange juice. CBG after giving juice = 90.

## 2013-10-23 NOTE — Progress Notes (Signed)
Clinical Social Work Department CLINICAL SOCIAL WORK PLACEMENT NOTE 10/23/2013  Patient:  Bill Cunningham, Bill Cunningham  Account Number:  [id number] Admit date:  10/22/2013  Clinical Social Worker:  Orpah Greek  Date/time:  10/23/2013 02:04 PM  Clinical Social Work is seeking post-discharge placement for this patient at the following level of care:   SKILLED NURSING   (*CSW will update this form in Epic as items are completed)   10/23/2013  Patient/family provided with [facility] Department of Clinical Social Work's list of facilities offering this level of care within the geographic area requested by the patient (or if unable, by the patient's family).  10/23/2013  Patient/family informed of their freedom to choose among providers that offer the needed level of care, that participate in Medicare, Medicaid or managed care program needed by the patient, have an available bed and are willing to accept the patient.  10/23/2013  Patient/family informed of [facility]' ownership interest in [facility], as well as of the fact that they are under no obligation to receive care at this facility.  PASARR submitted to EDS on 10/23/2013 PASARR number received on 10/23/2013  FL2 transmitted to all facilities in geographic area requested by pt/family on  10/23/2013 FL2 transmitted to all facilities within larger geographic area on   Patient informed that his/her managed care company has contracts with or will negotiate with  certain facilities, including the following:     Patient/family informed of bed offers received:  10/23/2013 Patient chooses bed at [facility] Physician recommends and patient chooses bed at    Patient to be transferred to [facility] on   Patient to be transferred to facility by  Patient and family notified of transfer on  Name of family member notified:    The following physician request were entered in  Epic:   Additional Comments:   Lincoln Maxin, LCSW [facility] Clinical Social Worker cell #: [phone]

## 2013-10-23 NOTE — Plan of Care (Signed)
Problem: Phase II Progression Outcomes Goal: Obtain order to discontinue catheter if appropriate Outcome: Not Met (add Reason) Chronic foley

## 2013-10-23 NOTE — Progress Notes (Addendum)
[facility] PROGRESS NOTE  Bill Cunningham VWU:[id number] DOB: [birth year] DOA: 10/22/2013 PCP: Beverley Fiedler, MD  Brief Summary  The patient is a 80 y.o. year-old male with history of coronary artery disease status post CABG, aortic valve replacement, ischemic cardiomyopathy with ejection fraction of 25-30%, wide-complex tachycardia status post pacemaker, carotid artery disease, kidney stones, BPH with chronic indwelling Foley, GERD, recently diagnosed seizure disorder who presents with fever, poor oral intake, and progressive weakness over the last 3 days. He was independent up until a month and a half ago when he developed seizure disorder. He was hospitalized and started on antiepileptic medications. Since that time, he has been sleepy and somewhat weak. He was receiving home health services and started using a walker. His thinking has been less clear and he has had some sundowning. His wife and his daughter have been taking care of him. Over the last 3 days, he developed fevers, increased confusion, weakness requiring additional assistance to get out of bed. He refused to eat, drink, or take his medications for the last 2 days. He did not receive his Lasix yesterday or today and he has developed some ankle swelling. His family brought him to the emergency department today because of concern for urinary tract infection and confusion.  He was found to have UTI and suggestion of heart failure on CXR, however, his blood pressures were soft.  Tx for UTI and repeat CXR in AM to determine if he needs some diuresis now that his BP is getting better.  His family is requesting placement at [facility] possible at the time of discharge.   Assessment/Plan  Severe sepsis (fever, tachycardia, low blood pressure) due to pyelonephritis/CAUTI due to indwelling urinary catheter present at time of admission. Catheter exchanged by ER MD  - Ns boluses as needed to keep SBP > 90  - Lactic acid 1.28  - Hold BP  medications  - BCx pending - UCx pending - Continue ceftriaxone   Metabolic encephalopathy/delirium due likely due UTI, improved this AM and better able to tolerate PO reliably - tx UTI as above  - Avoid sedating medications  - Frequent reorientation  - Falls precautions  -  Resume oral medications  Generalized weakness due to heart failure and acute illness  - PT/OT evaluations pending - SW consult for SNF placement   Hyponatremia, likely due to poor oral intake + heart failure, improving  - Resume lasix when BP tolerates and eating reliably - continue to hold today - Repeat BMP in AM   Dysphagia and severe protein calorie malnutrition - appreciate SLP and nutrition assistance - Dysphagia 2 with thin liquids - PPI  -  Dietary supplements  CAD s/p CABG, continue ASA & statin  - Hold other CV medications for now due to intermittent hypotension   Acute on chronic systolic heart failure due to ischemic cardiomyopathy with effusions on CXR - continue to hold lasix for now due to hypotension from sepsis  - Hold ACEI and BB  - Telemetry:  Paced, no dangerous arrhythmias, okay to d/c telemetry - Troponins:  Neg -  Repeat CXR in AM  Seizure disorder  - Seizure precautions  - Resume keppra and dilantin   Diet: Dysphagia 2 with thin  Access: PIV  IVF: off  Proph: lovenox   Code Status: DNR >> filled out DNR and MOST form with family today Family Communication: patient, his wife, daughter Disposition Plan:  To SNF pending results of urine culture, BPs improved.  Family requests  liquid meds where able   Consultants:  none  Procedures: 9/14  CXR:  Interstitial edema and small pleural effusions  Antibiotics:  Ceftriaxone 9/14 >>   HPI/Subjective:  Denies pain, SOB, nausea, vomiting.  Per RN, ate some breakfast and drank some coffee this AM.    Objective: Filed Vitals:   10/22/13 1713 10/22/13 2126 10/23/13 0516 10/23/13 1020  BP:  97/55 90/44 106/61  Pulse:  70  74 70  Temp:  98.1 F (36.7 C) 97.9 F (36.6 C) 97.2 F (36.2 C)  TempSrc:  Axillary Oral Oral  Resp:  Height:  (1.702 m)     Weight: 61.689 kg (136 lb)  58.7 kg (129 lb 6.6 oz)   SpO2:  100% 100% 99%    Intake/Output Summary (Last 24 hours) at 10/23/13 1141 Last data filed at 10/23/13 1118  Gross per 24 hour  Intake    490 ml  Output    550 ml  Net    -60 ml   Filed Weights   10/22/13 1713 10/23/13 0516  Weight: 61.689 kg (136 lb) 58.7 kg (129 lb 6.6 oz)    Exam:   General:  WM, No acute distress  HEENT:  NCAT, MMM  Cardiovascular:  RRR, nl S1, S2 no mrg, 2+ pulses, warm extremities  Respiratory: faint rales at left base, diminished at bases bilaterally, no rhonchi or wheeze, no increased WOB  Abdomen:   NABS, soft, NT/ND  MSK:   Normal tone and bulk, 1+ bilateral pitting LEE  Neuro:  Grossly intact  Data Reviewed: Basic Metabolic Panel:  Recent Labs Lab 10/22/13 1500 10/23/13 0435  NA 129* 135*  K 4.3 4.0  CL 88* 96  CO2 22 26  GLUCOSE 79 69*  BUN 21 22  CREATININE 1.15 1.17  CALCIUM 9.3 8.5   Liver Function Tests:  Recent Labs Lab 10/22/13 1500  AST 41*  ALT 55*  ALKPHOS 108  BILITOT 0.8  PROT 7.0  ALBUMIN 3.7   No results found for this basename: LIPASE, AMYLASE,  in the last 168 hours No results found for this basename: AMMONIA,  in the last 168 hours CBC:  Recent Labs Lab 10/22/13 1500 10/23/13 0435  WBC 11.1* 8.7  NEUTROABS 8.0*  --   HGB 13.5 11.4*  HCT 40.0 34.0*  MCV 86.4 86.1  PLT 226 191   Cardiac Enzymes:  Recent Labs Lab 10/22/13 1748 10/22/13 2340  TROPONINI <0.30 <0.30   BNP (last 3 results)  Recent Labs  07/08/13 0945 08/29/13 0649 10/22/13 1339  PROBNP 9136.0* 8114.0* 33814.0*   CBG:  Recent Labs Lab 10/23/13 0652  GLUCAP 90    Recent Results (from the past 240 hour(s))  CULTURE, BLOOD (ROUTINE X 2)     Status: None   Collection Time    10/22/13 12:59 PM      Result Value  Ref Range Status   Specimen Description BLOOD LEFT ANTECUBITAL   Final   Special Requests BOTTLES DRAWN AEROBIC AND ANAEROBIC   Final   Culture  Setup Time     Final   Value: 10/22/2013 16:29     Performed at [facility]   Culture     Final   Value:        BLOOD CULTURE RECEIVED NO GROWTH TO DATE CULTURE WILL BE HELD FOR 5 DAYS BEFORE ISSUING A FINAL NEGATIVE REPORT     Performed at [facility]   Report Status  PENDING   Incomplete  CULTURE, BLOOD (ROUTINE X 2)     Status: None   Collection Time    10/22/13  1:40 PM      Result Value Ref Range Status   Specimen Description BLOOD RIGHT ANTECUBITAL   Final   Special Requests BOTTLES DRAWN AEROBIC AND ANAEROBIC   Final   Culture  Setup Time     Final   Value: 10/22/2013 16:28     Performed at [facility]   Culture     Final   Value:        BLOOD CULTURE RECEIVED NO GROWTH TO DATE CULTURE WILL BE HELD FOR 5 DAYS BEFORE ISSUING A FINAL NEGATIVE REPORT     Performed at [facility]   Report Status PENDING   Incomplete     Studies: Dg Chest Portable 1 View  10/22/2013   CLINICAL DATA:  Altered mental status, weakness  EXAM: PORTABLE CHEST - 1 VIEW  COMPARISON:  10/03/2013  FINDINGS: Moderate enlargement of the cardiac silhouette is reidentified with evidence of CABG. Left-sided dual lead pacer in place. Small bilateral pleural effusions are identified with interstitial Kerley B-lines and central vascular congestion compatible with early interstitial edema. Calcified pleural plaques are reidentified.  IMPRESSION: Early interstitial edema and small pleural effusions.   Electronically Signed   By: Christiana Pellant M.D.   On: 10/22/2013 13:15    Scheduled Meds: . aspirin EC  81 mg Oral QHS  . cefTRIAXone (ROCEPHIN)  IV  1 g Intravenous Q24H  . enoxaparin (LOVENOX) injection  40 mg Subcutaneous Q24H  . levETIRAcetam  1,000 mg Intravenous Q24H   And  . levETIRAcetam  1,500 mg Intravenous Q24H  .  pantoprazole (PROTONIX) IV  40 mg Intravenous Q12H  . phenytoin  300 mg Oral QHS  . simvastatin  40 mg Oral q1800  . sodium chloride  3 mL Intravenous Q12H   Continuous Infusions:   Active Problems:   UTI (urinary tract infection)    Time spent: 30 min    Kavin Weckwerth, [facility]  [facility] Pager [phone]. If 7PM-7AM, please contact night-coverage at [email or web address], password [facility] 10/23/2013, 11:41 AM  LOS: 1 day

## 2013-10-23 NOTE — Progress Notes (Signed)
Clinical Social Work Department BRIEF PSYCHOSOCIAL ASSESSMENT 10/23/2013  Patient:  Bill Cunningham, Bill Cunningham     Account Number:  [id number]     Admit date:  10/22/2013  Clinical Social Worker:  Orpah Greek  Date/Time:  10/23/2013 01:59 PM  Referred by:  Physician  Date Referred:  10/23/2013 Referred for  SNF Placement   Other Referral:   Interview type:  Patient Other interview type:   and wife & daughter at bedside    PSYCHOSOCIAL DATA Living Status:  WIFE Admitted from facility:   Level of care:   Primary support name:  Jermone Geister (wife) & Chapman Fitch (dtr) c#: [phone] Primary support relationship to patient:  CHILD, ADULT Degree of support available:   good    CURRENT CONCERNS Current Concerns  Post-Acute Placement   Other Concerns:    SOCIAL WORK ASSESSMENT / PLAN CSW received consult for SNF placement, noted PT/OT recommended SNF as well at discharge.   Assessment/plan status:  Information/Referral to [facility] Other assessment/ plan:   Information/referral to community resources:   CSW completed FL2 and faxed information out to [facility] - provided bed offers.    PATIENT'S/FAMILY'S RESPONSE TO PLAN OF CARE: Patient's wife & daughter states that patient was recently at [facility] 8/6 - 09/18/13 but discharged home with his wife. Patient's family have now come to the realization that patient will need SNF, that they are no longer able to care for patient in his weakened condition. Patient's daughter had already contacted Nikki @ [facility] to let them know about their interest in their facility. CSW confirmed with Lowella Bandy that they would be able to take patient at discharge.       Lincoln Maxin, LCSW [facility] Clinical Social Worker cell #: [phone]

## 2013-10-23 NOTE — Plan of Care (Signed)
Problem: Phase I Progression Outcomes Goal: Voiding-avoid urinary catheter unless indicated Outcome: Not Applicable Date Met:  91/79/15 Pt has chronic Foley catheter

## 2013-10-23 NOTE — Evaluation (Signed)
Physical Therapy Evaluation Patient Details Name: Bill Cunningham MRN: [id number] DOB: 05/18/1921 Today's Date: 10/23/2013   History of Present Illness  This 80 year old man was admitted for uti.  He has a h/o CABG, heart failure, CAD, and recent seizure  Clinical Impression  Patient ambulated in hall with RW. Pt will benefit from PT to address problems listed in note.     Follow Up Recommendations SNF    Equipment Recommendations  Rolling walker with 5" wheels;None recommended by PT    Recommendations for Other Services       Precautions / Restrictions Precautions Precautions: Fall Precaution Comments: confused but follows commands Restrictions Weight Bearing Restrictions: No      Mobility  Bed Mobility Overal bed mobility: Needs Assistance Bed Mobility: Supine to Sit     Supine to sit: Min assist     General bed mobility comments: to protect catheter. Pt is aware of this but reached for it twice  Transfers Overall transfer level: Needs assistance Equipment used: Rolling walker (2 wheeled) Transfers: Sit to/from Stand Sit to Stand: Min assist Stand pivot transfers: Min assist       General transfer comment: assist to rise and steady  Ambulation/Gait Ambulation/Gait assistance: Min assist Ambulation Distance (Feet): 200 Feet Assistive device: Rolling walker (2 wheeled)       General Gait Details: guidance of RW , cues for posture.  Stairs            Wheelchair Mobility    Modified Rankin (Stroke Patients Only)       Balance Overall balance assessment: Needs assistance;History of Falls Sitting-balance support: Feet supported;No upper extremity supported Sitting balance-Leahy Scale: Fair     Standing balance support: During functional activity;Bilateral upper extremity supported Standing balance-Leahy Scale: Poor                               Pertinent Vitals/Pain Pain Assessment: No/denies pain    Home Living  Family/patient expects to be discharged to:: Skilled nursing facility Living Arrangements: Spouse/significant other                    Prior Function Level of Independence: Needs assistance (for past 6 weeks )         Comments: Pt's daughter reports that pt has been sponge bathing since his catheter insertion about 5 months ago. Pt is highly anticipative of surgery to remove catheter and would like to return to taking showers. Pt is active in his garden     Hand Dominance        Extremity/Trunk Assessment   Upper Extremity Assessment: Defer to OT evaluation           Lower Extremity Assessment: Generalized weakness         Communication   Communication: HOH  Cognition Arousal/Alertness: Awake/alert Behavior During Therapy: WFL for tasks assessed/performed Overall Cognitive Status: Impaired/Different from baseline Area of Impairment: Attention;Safety/judgement               General Comments: confused, reaching for wrong foot when asked to don socks.  Cues for sustained attention to task    General Comments      Exercises        Assessment/Plan    PT Assessment Patient needs continued PT services  PT Diagnosis Difficulty walking   PT Problem List Decreased strength;Decreased range of motion;Decreased activity tolerance;Decreased mobility;Decreased knowledge of precautions;Decreased skin integrity  PT  Treatment Interventions DME instruction;Gait training;Stair training;Functional mobility training;Therapeutic activities;Therapeutic exercise;Patient/family education   PT Goals (Current goals can be found in the Care Plan section) Acute Rehab PT Goals Patient Stated Goal: agreeable to ambulated. PT Goal Formulation: With family Time For Goal Achievement: 11/06/13 Potential to Achieve Goals: Good    Frequency Min 3X/week   Barriers to discharge        Co-evaluation               End of Session Equipment Utilized During Treatment: Gait  belt Activity Tolerance: Patient tolerated treatment well Patient left: in chair;with call bell/phone within reach;with chair alarm set;with family/visitor present Nurse Communication: Mobility status         Time: 1610-9604 PT Time Calculation (min): 13 min   Charges:   PT Evaluation $Initial PT Evaluation Tier I: 1 Procedure PT Treatments $Gait Training: 8-22 mins   PT G Codes:          Rada Hay 10/23/2013, 3:40 PM Blanchard Kelch PT [phone]

## 2013-10-23 NOTE — Care Management Note (Addendum)
    Page 1 of 1   10/25/2013     2:49:19 PM CARE MANAGEMENT NOTE 10/25/2013  Patient:  Bill Cunningham, Bill Cunningham   Account Number:  [id number]  Date Initiated:  10/23/2013  Documentation initiated by:  Lanier Clam  Subjective/Objective Assessment:   80 Y/O M ADMITTED W/UTI.HX:DNR.     Action/Plan:   FROM HOME.   Anticipated DC Date:  10/25/2013   Anticipated DC Plan:  SKILLED NURSING FACILITY      DC Planning Services  CM consult      Choice offered to / List presented to:             Status of service:  Completed, signed off Medicare Important Message given?  YES (If response is "NO", the following Medicare IM given date fields will be blank) Date Medicare IM given:  10/25/2013 Medicare IM given by:  [facility] Date Additional Medicare IM given:   Additional Medicare IM given by:    Discharge Disposition:  SKILLED NURSING FACILITY  Per UR Regulation:  Reviewed for med. necessity/level of care/duration of stay  If discussed at Long Length of Stay Meetings, dates discussed:    Comments:  10/25/13 Jaimes Eckert RN,BSN NCM [phone] D/C SNF.  10/23/13 Paul Torpey RN,BSN NCM [phone] [facility],THIN LIQ.AWAIT PT RECOMMENDATIONS.

## 2013-10-23 NOTE — Evaluation (Signed)
Occupational Therapy Evaluation Patient Details Name: Bill Cunningham MRN: [id number] DOB: 12-23-1921 Today's Date: 10/23/2013    History of Present Illness This 80 year old man was admitted for uti.  He has a h/o CABG, heart failure, CAD, and recent seizure   Clinical Impression   Pt was admitted for uti.  At time of evaluation, he had some confusion and decreased attention but he was able to follow commands with multiple cues as he is HOH.  Prior to 6 weeks ago, pt was independent with adls.  He now needs min to mod A for these due to decreased cognition and balance.  He will benefit from skilled OT to increase safety and independence.  Goals are set for supervision to min guard.      Follow Up Recommendations  SNF    Equipment Recommendations  3 in 1 bedside comode    Recommendations for Other Services       Precautions / Restrictions Precautions Precautions: Fall Precaution Comments: confused but follows commands; multiple cues due to [facility] Restrictions Weight Bearing Restrictions: No      Mobility Bed Mobility Overal bed mobility: Needs Assistance Bed Mobility: Supine to Sit     Supine to sit: Min assist     General bed mobility comments: to protect catheter. Pt is aware of this but reached for it twice  Transfers Overall transfer level: Needs assistance Equipment used: Rolling walker (2 wheeled) Transfers: Sit to/from [employer] Sit to Stand: Min assist Stand pivot transfers: Min assist       General transfer comment: assist to rise and steady    Balance Overall balance assessment: Needs assistance Sitting-balance support: Feet supported;No upper extremity supported Sitting balance-Leahy Scale: Fair     Standing balance support: Bilateral upper extremity supported Standing balance-Leahy Scale: Poor                              ADL Overall ADL's : Needs assistance/impaired Eating/Feeding: Supervision/ safety;Sitting;Cueing  for sequencing   Grooming: Wash/dry hands;Wash/dry face;Sitting;Supervision/safety   Upper Body Bathing: Minimal assitance;Sitting   Lower Body Bathing: Moderate assistance;Sit to/from stand   Upper Body Dressing : Minimal assistance;Sitting   Lower Body Dressing: Moderate assistance;Sit to/from stand   Toilet Transfer: Minimal assistance;Stand-pivot   Toileting- Clothing Manipulation and Hygiene: Minimal assistance;Sit to/from stand         General ADL Comments: Pt was eating when I arrived:  encouraged more intake as pt had barely eaten.  Alternated liquids and bites per SLP recommendation.  Pt still has some confusion:  attempted to don sock on foot that already had sock on.  Needed cues to stay focused on task.  Family came at end of session: pt was independent prior to 6 weeks ago.     Vision                     Perception     Praxis      Pertinent Vitals/Pain Pain Assessment: No/denies pain     Hand Dominance     Extremity/Trunk Assessment Upper Extremity Assessment Upper Extremity Assessment: Generalized weakness           Communication Communication Communication: HOH   Cognition Arousal/Alertness: Awake/alert Behavior During Therapy: WFL for tasks assessed/performed Overall Cognitive Status: Impaired/Different from baseline Area of Impairment: Attention;Safety/judgement               General Comments: confused, reaching for  wrong foot when asked to don socks.  Cues for sustained attention to task   General Comments       Exercises       Shoulder Instructions      Home Living Family/patient expects to be discharged to:: Skilled nursing facility                                        Prior Functioning/Environment Level of Independence: Needs assistance (for past 6 weeks )             OT Diagnosis: Generalized weakness;Cognitive deficits   OT Problem List: Decreased strength;Decreased activity  tolerance;Impaired balance (sitting and/or standing);Decreased cognition;Decreased safety awareness   OT Treatment/Interventions: Self-care/ADL training;DME and/or AE instruction;Patient/family education;Balance training    OT Goals(Current goals can be found in the care plan section) Acute Rehab OT Goals Patient Stated Goal: agreeable to OT.  Pt wants to go home OT Goal Formulation: With patient Time For Goal Achievement: 11/06/13 Potential to Achieve Goals: Good ADL Goals Pt Will Perform Grooming: with supervision;standing Pt Will Transfer to Toilet: with min guard assist;ambulating;bedside commode Pt Will Perform Toileting - Clothing Manipulation and hygiene: with supervision;sit to/from stand Additional ADL Goal #1: pt will sustain attention to task for 5 minutes without redirection in quiet environment Additional ADL Goal #2: Pt will complete UB ADLs with supervision, min cues and LB adls with min guard, min cues, sit to stand  OT Frequency: Min 2X/week   Barriers to D/C:            Co-evaluation              End of Session Nurse Communication: Mobility status  Activity Tolerance: Patient tolerated treatment well Patient left: in chair;with call bell/phone within reach;with chair alarm set;with family/visitor present   Time: 1245-1316 OT Time Calculation (min): 31 min Charges:  OT General Charges $OT Visit: 1 Procedure OT Evaluation $Initial OT Evaluation Tier I: 1 Procedure OT Treatments $Self Care/Home Management : 23-37 mins G-Codes:    Bill Cunningham November 13, 2013, 2:27 PM  Bill Cunningham, OTR/L [phone] 2013-11-13

## 2013-10-23 NOTE — Evaluation (Signed)
Clinical/Bedside Swallow Evaluation Patient Details  Name: Bill Cunningham MRN: [id number] Date of Birth: January 09, 1922  Today's Date: 10/23/2013 Time: 4098-1191 SLP Time Calculation (min): 45 min  Past Medical History:  Past Medical History  Diagnosis Date  . Hyperlipidemia   . LBBB (left bundle branch block)   . Depression   . COPD (chronic obstructive pulmonary disease)   . Calcium oxalate renal stones   . Dizziness   . GERD (gastroesophageal reflux disease)   . Diverticulosis   . IBS (irritable bowel syndrome)   . Leg cramps     not vascular in origin   . Liver cyst   . Wide-complex tachycardia     post op - on a beta blocker   . Sinus congestion   . Bleeding nose     cauteriszed dr Lazarus Salines  / cauterization repeated 10/11  . Coronary artery disease     Nuclear June 03, 2010, scar anterior wall apex and inferior wall, no definite ischemia,    not gated  . Intolerance of drug     Niaspan  . S/P aortic valve replacement with bioprosthetic valve     echo.[name] April, 2011... good valve function... mild AI  . S/P CABG (coronary artery bypass graft) January, 2007  . LV dysfunction     EF 45% / EF 25-30%, echo, April, 2011 / EF 25-30%, echo, April, 2012, akinesis of the inferior and posterior walls  . Ischemic cardiomyopathy     EF followed carefully  . Carotid artery disease     Doppler, 2011, bilateral 40-59%    /   Doppler, March, 2013, no change, bilateral 40-59%  . BPH (benign prostatic hyperplasia)     2013  . Cough     January, 2014  . Ejection fraction < 50%   . Kidney stones    Past Surgical History:  Past Surgical History  Procedure Laterality Date  . Cholecystectomy    . Aortic valve replacement  02/2005  . Insert / replace / remove pacemaker      must call rep to interrogate pacemaker [phone], [employer], older model  . Coronary artery bypass graft      x5  -- last one in 07  . Lithotripsy    . Mosaic ultra porcine heart valve      medtronic,   [id number], 03/02/2005, MD clarence Cornelius Moras [phone], device questions [phone]   HPI:  80 yo male adm to [facility] with AMS, diagnosed with UTI.  PMH + for COPD, UTI, IBS, GERD, seizure.  Pt CXR showed edema and pleural effusion 9/14.  Family reports pt with difficulty swallowing pills with sensation of burning in upper esophagus - daughter is crushing medicine at home and administering with chocolate pudding.  Pt with ill fitting partials that cause pain to use and has poor dentition.     Assessment / Plan / Recommendation Clinical Impression  Pt presents with mild oral Bill appearing consistent with cognitive and/or sensorimotor based difficulties.  He presented with adequate airway protection during po today.    Mild delay in oral transiting, excessive mastication of meat reported by pt/family.  Sausage was retained in left sulci from breakfast with pt requiring verbal/visual cues to clear with water/applesauce.    Suspect pt's pain/burning in proximal esophagus with swallowing pills may be due to h/o GERD. Daughter compensates by crushing medicine and giving with pudding.     No s/s of aspiration noted with po intake.   Pt/family deny pt coughing with  intake at home but admits to weight loss and that pt is not a "good eater".    Given pt with pain in placing partials and oral Bill, agree with dys2/thin diet for now - Pt/family agrees.  Educated family/pt to compensation strategies to Bill Cunningham efficiency of swallowing using teach back for reinforcement.  SLP to follow up x1 for education.    Thanks for this referral.      Aspiration Risk    Mild    Diet Recommendation Bill Cunningham (Fine chop);Thin liquid (extra gravy sauces)   Liquid Administration via: Cup;Straw Medication Administration: Whole meds with puree (start with liquid, crush if large and not contraindicated) Supervision: Intermittent supervision to cue for compensatory strategies Compensations: Slow rate;Small  sips/bites Postural Changes and/or Swallow Maneuvers: Seated upright 90 degrees;Upright 30-60 min after meal    Other  Recommendations Oral Care Recommendations: Oral care BID   Follow Up Recommendations    N/A   Frequency and Duration min 1 x/week  1 week   Pertinent Vitals/Pain Afebrile, decreased      Swallow Study Prior Functional Status   see HHX    General Date of Onset: 10/23/13 HPI: 80 yo male adm to [facility] with AMS, diagnosed with UTI.  PMH + for COPD, UTI, IBS, GERD, seizure.  Pt CXR showed edema and pleural effusion 9/14.  Family reports pt with difficulty swallowing pills with sensation of burning in upper esophagus - daughter is crushing medicine at home and administering with chocolate pudding.  Pt with ill fitting partials that cause pain to use and has poor dentition.   Type of Study: Bedside swallow evaluation Diet Prior to this Study: Bill Cunningham (chopped);Thin liquids Temperature Spikes Noted: No Respiratory Status: Room air History of Recent Intubation: No Behavior/Cognition: Alert;Cooperative (delayed responses) Oral Cavity - Dentition: Poor condition (has ill fitting partials that cause pain to place) Self-Feeding Abilities: Able to feed self Patient Positioning: Upright in bed Baseline Vocal Quality: Clear Volitional Cough: Strong Volitional Swallow: Able to elicit    Oral/Motor/Sensory Function Overall Oral Motor/Sensory Function: Appears within functional limits for tasks assessed (pt leaning left slightly, ? mild asymmetry left facial, bilateral palatal elevation)   Ice Chips Ice chips: Not tested   Thin Liquid Thin Liquid: Within functional limits Presentation: Straw    Nectar Thick Nectar Thick Liquid: Not tested   Honey Thick Honey Thick Liquid: Not tested   Puree Puree: Within functional limits Presentation: Self Fed;Spoon   Solid   GO    Solid: Impaired Presentation: Self Fed Oral Phase Impairments: Impaired anterior to posterior  transit;Impaired mastication Oral Phase Functional Implications: Left lateral sulci pocketing;Oral residue Other Comments: use of applesauce swallows facilitate oral clearance, consumption of water did not fully clear oral cavity, swish and expectoration with water reviewed to maximize oral clearance       Mills Koller, MS [facility] SLP [phone]

## 2013-10-23 NOTE — Progress Notes (Signed)
INITIAL NUTRITION ASSESSMENT  DOCUMENTATION CODES Per approved criteria  -Severe malnutrition in the context of chronic illness  Pt meets criteria for severe MALNUTRITION in the context of chronic illness as evidenced by PO intake < 75% for one month, 12% body weight loss in 3 months, muscle wasting and subcutaneous fat loss .   INTERVENTION: -Recommend Boost Plus BID, each supplement would provide 360 kcal, 14 gram protein -Diet textures per SLP  -Encouraged intake -Will continue to monitor  NUTRITION DIAGNOSIS: Inadequate oral intake related to refusing meals as evidenced by PO intake < 75%.   Goal: Pt to meet >/= 90% of their estimated nutrition needs    Monitor:  Total protein/energy intake, labs, weight, swallow profile  Reason for Assessment: MST  80 y.o. male  Admitting Dx: <principal problem not specified>  ASSESSMENT: The patient is a 80 y.o. year-old male with history of coronary artery disease status post CABG, aortic valve replacement, ischemic cardiomyopathy with ejection fraction of 25-30%, wide-complex tachycardia status post pacemaker, carotid artery disease, kidney stones, BPH with chronic indwelling Foley, GERD, recently diagnosed seizure disorder who presents with fever, poor oral intake, and progressive weakness over the last 3 days.  -Pt's family reported pt refusing meals and water for past several days. Has had a general functional decline over past one month, and has also experienced decreased appetite.  -Family has tried to encourage pt to consume Ensure supplements; however pt has also been refusing them  -Tolerates softer foods; currently is without dentures. SLP evaluation pending, pt on Dys2/thin for conservative measures -0% PO intake during admit, pt consuming only coffee for breakfast -Endorsed weight loss. Was unable to quantify amount. Per previous medical records, pt has lost 15 lbs in past 3 months (12% body weight loss, severe for time  frame) -Family willing to encourage Boost as supplement alternative. Also reviewed softer high protein snacks (cottage cheese, peanut butter, yogurt, and Carnation Instant Breakfast w/milk) to offer pt -Pt with evident muscle wasting and subcutaneous fat loss around temporal, occipital, clavicle, and acromion regions   Height: Ht Readings from Last 1 Encounters:  10/22/13  (1.702 m)    Weight: Wt Readings from Last 1 Encounters:  10/23/13 129 lb 6.6 oz (58.7 kg)    Ideal Body Weight: 140 lbs  % Ideal Body Weight: 92%  Wt Readings from Last 10 Encounters:  10/23/13 129 lb 6.6 oz (58.7 kg)  10/11/13 135 lb (61.236 kg)  10/10/13 136 lb 6.4 oz (61.871 kg)  09/13/13 139 lb 1.8 oz (63.1 kg)  07/20/13 144 lb (65.318 kg)  03/19/13 144 lb (65.318 kg)  10/02/12 145 lb (65.772 kg)  08/15/12 146 lb 12.8 oz (66.588 kg)  02/17/12 143 lb 12.8 oz (65.227 kg)  01/20/12 142 lb 12.8 oz (64.774 kg)    Usual Body Weight: 144 lbs per previous med records  % Usual Body Weight: 89%  BMI:  Body mass index is 20.26 kg/(m^2).  Estimated Nutritional Needs: Kcal: 1550-1750 Protein: 70-80 gram Fluid: >/=1700 ml.daily  Skin: WDL  Diet Order: [id number]  EDUCATION NEEDS: -No education needs identified at this time   Intake/Output Summary (Last 24 hours) at 10/23/13 1148 Last data filed at 10/23/13 1118  Gross per 24 hour  Intake    490 ml  Output    550 ml  Net    -60 ml    Last BM: 9/14   Labs:   Recent Labs Lab 10/22/13 1500 10/23/13 0435  NA 129* 135*  K  4.3 4.0  CL 88* 96  CO2 22 26  BUN 21 22  CREATININE 1.15 1.17  CALCIUM 9.3 8.5  GLUCOSE 79 69*    CBG (last 3)   Recent Labs  10/23/13 0652  GLUCAP 90    Scheduled Meds: . aspirin EC  81 mg Oral QHS  . cefTRIAXone (ROCEPHIN)  IV  1 g Intravenous Q24H  . enoxaparin (LOVENOX) injection  40 mg Subcutaneous Q24H  . levETIRAcetam  1,000 mg Intravenous Q24H   And  . levETIRAcetam  1,500 mg Intravenous  Q24H  . pantoprazole (PROTONIX) IV  40 mg Intravenous Q12H  . phenytoin  300 mg Oral QHS  . simvastatin  40 mg Oral q1800  . sodium chloride  3 mL Intravenous Q12H    Continuous Infusions:   Past Medical History  Diagnosis Date  . Hyperlipidemia   . LBBB (left bundle branch block)   . Depression   . COPD (chronic obstructive pulmonary disease)   . Calcium oxalate renal stones   . Dizziness   . GERD (gastroesophageal reflux disease)   . Diverticulosis   . IBS (irritable bowel syndrome)   . Leg cramps     not vascular in origin   . Liver cyst   . Wide-complex tachycardia     post op - on a beta blocker   . Sinus congestion   . Bleeding nose     cauteriszed dr Lazarus Salines  / cauterization repeated 10/11  . Coronary artery disease     Nuclear June 03, 2010, scar anterior wall apex and inferior wall, no definite ischemia,    not gated  . Intolerance of drug     Niaspan  . S/P aortic valve replacement with bioprosthetic valve     echo.[name] April, 2011... good valve function... mild AI  . S/P CABG (coronary artery bypass graft) January, 2007  . LV dysfunction     EF 45% / EF 25-30%, echo, April, 2011 / EF 25-30%, echo, April, 2012, akinesis of the inferior and posterior walls  . Ischemic cardiomyopathy     EF followed carefully  . Carotid artery disease     Doppler, 2011, bilateral 40-59%    /   Doppler, March, 2013, no change, bilateral 40-59%  . BPH (benign prostatic hyperplasia)     2013  . Cough     January, 2014  . Ejection fraction < 50%   . Kidney stones     Past Surgical History  Procedure Laterality Date  . Cholecystectomy    . Aortic valve replacement  02/2005  . Insert / replace / remove pacemaker      must call rep to interrogate pacemaker [phone], [employer] Jude, older model  . Coronary artery bypass graft      x5  -- last one in 07  . Lithotripsy    . Mosaic ultra porcine heart valve      medtronic,  [id number], 03/02/2005, MD Tressie Stalker [phone],  device questions [phone]    Lloyd Huger MS RD LDN Clinical Dietitian Pager:[phone]

## 2013-10-24 ENCOUNTER — Inpatient Hospital Stay (HOSPITAL_COMMUNITY): Payer: Medicare Other

## 2013-10-24 MED ORDER — PANTOPRAZOLE SODIUM 40 MG PO PACK
40.0000 mg | PACK | Freq: Two times a day (BID) | ORAL | Status: DC
  Administered 2013-10-24 (×2): 40 mg
  Filled 2013-10-24 (×5): qty 20

## 2013-10-24 MED ORDER — HALOPERIDOL LACTATE 5 MG/ML IJ SOLN
1.0000 mg | Freq: Once | INTRAMUSCULAR | Status: AC
  Administered 2013-10-24: 1 mg via INTRAVENOUS
  Filled 2013-10-24: qty 1

## 2013-10-24 NOTE — Progress Notes (Signed)
Physical Therapy Treatment Patient Details Name: Bill Cunningham MRN: [id number] DOB: [birth year] Today's Date: 10/24/2013    History of Present Illness This 80 year old man was admitted for uti.  He has a h/o CABG, heart failure, CAD, and recent seizure    PT Comments    Pt progressing but still with decr balance and would be at risk for falls; Pleasantly confused, easily distracted  Follow Up Recommendations  SNF     Equipment Recommendations  Rolling walker with 5" wheels;None recommended by PT    Recommendations for Other Services       Precautions / Restrictions Precautions Precautions: Fall Precaution Comments: confused but follows commands Restrictions Weight Bearing Restrictions: No    Mobility  Bed Mobility Overal bed mobility: Needs Assistance Bed Mobility: Supine to Sit     Supine to sit: Min guard     General bed mobility comments: cues for task completion, attention  Transfers Overall transfer level: Needs assistance Equipment used: Rolling walker (2 wheeled) Transfers: Sit to/from Stand Sit to Stand: Min assist         General transfer comment: assist to rise and steady, cues for hand placement  Ambulation/Gait Ambulation/Gait assistance: Min assist;Mod assist Ambulation Distance (Feet): 220 Feet Assistive device: Rolling walker (2 wheeled) Gait Pattern/deviations: Step-through pattern;Trunk flexed     General Gait Details: guidance of RW , multi-modal cues for posture.   Stairs            Wheelchair Mobility    Modified Rankin (Stroke Patients Only)       Balance Overall balance assessment: Needs assistance         Standing balance support: Bilateral upper extremity supported Standing balance-Leahy Scale: Poor                      Cognition Arousal/Alertness: Awake/alert Behavior During Therapy: WFL for tasks assessed/performed Overall Cognitive Status: Impaired/Different from baseline Area of Impairment:  Attention;Safety/judgement;Following commands   Current Attention Level: Sustained   Following Commands: Follows one step commands with increased time Safety/Judgement: Decreased awareness of safety;Decreased awareness of deficits     General Comments: confusion persists, pt reaches end of hallway and  begins pushing on wall ("trying to open this thing") pt requires physical assist and multi-modla cues  to turn around, change directions    Exercises      General Comments        Pertinent Vitals/Pain Pain Assessment: No/denies pain    Home Living                      Prior Function            PT Goals (current goals can now be found in the care plan section) Acute Rehab PT Goals PT Goal Formulation: With family Time For Goal Achievement: 11/06/13 Potential to Achieve Goals: Good Progress towards PT goals: Progressing toward goals    Frequency  Min 3X/week    PT Plan Current plan remains appropriate    Co-evaluation             End of Session Equipment Utilized During Treatment: Gait belt Activity Tolerance: Patient tolerated treatment well Patient left: in chair;with call bell/phone within reach;with chair alarm set;with family/visitor present     Time: 4098-1191 PT Time Calculation (min): 28 min  Charges:  $Gait Training: 23-37 mins  G CodesDrucilla Chalet 10/24/2013, 12:19 PM

## 2013-10-24 NOTE — Progress Notes (Signed)
Speech Language Pathology Treatment: Dysphagia  Patient Details Name: Bill Cunningham MRN: [id number] DOB: 08/31/1921 Today's Date: 10/24/2013 Time: 3817-7116 SLP Time Calculation (min): 31 min  Assessment / Plan / Recommendation Clinical Impression  Intake remains poor however pt is self feeding with encouragement- he continues to take very small bites.  Pt's family present reports pt with decreased intake since seizure requiring last hospitalization.    Pt declined to use partials but repeatedly expressed desire to have them repaired for adequate usage. Encouraged him and his family to seek dentist guidance upon dc from hospital.    Pt observed consuming [place] *ground, yams, applesauce and sweet tea. Excessive "mastication" noted with [place] with oral residuals - pt benefited from max cues to use applesauce to facilitate oral clearance.   No indications of airway compromise.    Do not recommend advancement in diet beyond current level (dys2/thin) without pt usage of partials.    Please order follow up at SNF for dysphagia management - hopeful for advancement with properly fitting partials.  All education completed and pt appears to be tolerating diet.  SLP to sign off.        HPI HPI: 80 yo male adm to [facility] with AMS, diagnosed with UTI.  PMH + for COPD, UTI, IBS, GERD, seizure.  Pt CXR showed edema and pleural effusion 9/14.  Family reports pt with difficulty swallowing pills with sensation of burning in upper esophagus - daughter is crushing medicine at home and administering with chocolate pudding.  Pt with ill fitting partials that cause pain to use and has poor dentition.     Pertinent Vitals Pain Assessment: No/denies pain  SLP Plan  All goals met    Recommendations Diet recommendations: Dysphagia 2 (fine chop);Thin liquid Liquids provided via: Straw;Cup Medication Administration: Whole meds with puree Supervision: Intermittent supervision to cue for compensatory  strategies Compensations: Slow rate;Small sips/bites Postural Changes and/or Swallow Maneuvers: Seated upright 90 degrees;Upright 30-60 min after meal              Oral Care Recommendations: Oral care BID Plan: All goals met    [place], [place] [facility] SLP [phone]

## 2013-10-24 NOTE — Progress Notes (Signed)
Occupational Therapy Treatment Patient Details Name: Bill Cunningham MRN: [id number] DOB: 05-24-1921 Today's Date: 10/24/2013    History of present illness This 80 year old man was admitted for uti.  He has a h/o CABG, heart failure, CAD, and recent seizure   OT comments  Pt progressing slowly:  Confusion present and hand over hand assist needed to initiate brushing teeth. Additional time needed for all activities.  Follow Up Recommendations  SNF    Equipment Recommendations  3 in 1 bedside comode    Recommendations for Other Services      Precautions / Restrictions Precautions Precautions: Fall Precaution Comments: confused but follows commands Restrictions Weight Bearing Restrictions: No       Mobility Bed Mobility Overal bed mobility: Needs Assistance Bed Mobility: Supine to Sit     Supine to sit: Min guard Sit to supine: Min assist   General bed mobility comments: assist for LE's; cues for technique  Transfers Overall transfer level: Needs assistance Equipment used: Rolling walker (2 wheeled) Transfers: Sit to/from Stand Sit to Stand: Min assist         General transfer comment: assist to rise and steady, cues for hand placement    Balance Overall balance assessment: Needs assistance         Standing balance support: Bilateral upper extremity supported Standing balance-Leahy Scale: Poor                     ADL       Grooming: Oral care;Moderate assistance;Standing                                 General ADL Comments: Pt stood at sink to brush teeth.  Hand over hand assistance given to initiate activity and full set up required.  Assisted with placing toothbrush, handing him cup and taking items from him.  Ambulated back to bed at end of session      Vision                     Perception     Praxis      Cognition   Behavior During Therapy: [facility] for tasks assessed/performed Overall Cognitive Status:  Impaired/Different from baseline Area of Impairment: Attention;Safety/judgement;Following commands   Current Attention Level: Sustained    Following Commands: Follows one step commands with increased time Safety/Judgement: Decreased awareness of safety;Decreased awareness of deficits     General Comments: confused.  Needed hand over hand A to initiate brushing teeth    Extremity/Trunk Assessment               Exercises     Shoulder Instructions       General Comments      Pertinent Vitals/ Pain       Pain Assessment: No/denies pain  Home Living                                          Prior Functioning/Environment              Frequency Min 2X/week     Progress Toward Goals  OT Goals(current goals can now be found in the care plan section)  Progress towards OT goals: Progressing toward goals (slowly)     Plan      Co-evaluation  End of Session     Activity Tolerance Patient tolerated treatment well   Patient Left in bed;with call bell/phone within reach;with bed alarm set   Nurse Communication          Time: 1355-1414 OT Time Calculation (min): 19 min  Charges: OT General Charges $OT Visit: 1 Procedure OT Treatments $Self Care/Home Management : 8-22 mins  Brinlynn Gorton 10/24/2013, 3:16 PM  Marica Otter, OTR/L [phone] 10/24/2013

## 2013-10-24 NOTE — Progress Notes (Signed)
PHARMACIST - PHYSICIAN COMMUNICATION DR:   Susie Cassette CONCERNING: Proton Pump Inhibitor IV to Oral Route Change Policy  The patient is receiving Protonix by the intravenous route. Based on criteria approved by the Pharmacy and Therapeutics Committee and the Medical Executive Committee, the medication is being converted to the equivalent oral dose form.   These criteria include:  -No Active GI bleeding  -Able to tolerate diet of full liquids (or better) or tube feeding  -Able to tolerate other medications by the oral or enteral route   If you have any questions about this conversion, please contact the Pharmacy Department (ext [phone]). Thank you.  Loralee Pacas, PharmD, BCPS 10/24/2013 8:36 AM

## 2013-10-24 NOTE — Progress Notes (Signed)
Bed alarm went off, Another RN went in the patient room, stated that patient is halfway out of bed and was trying to pull foley catheter. Patient restless restless and agitated at this time. MD paged made orders.

## 2013-10-24 NOTE — Progress Notes (Signed)
[facility] PROGRESS NOTE  Bill Cunningham ZOX:[id number] DOB: 27-May-1921 DOA: 10/22/2013 PCP: Beverley Fiedler, MD  Assessment/Plan: Active Problems:   Essential hypertension, benign   Carotid artery disease   Hyponatremia   UTI (urinary tract infection)   Protein-calorie malnutrition, severe   Acute on chronic combined systolic and diastolic heart failure   Brief Summary  The patient is a 80 y.o. year-old male with history of coronary artery disease status post CABG, aortic valve replacement, ischemic cardiomyopathy with ejection fraction of 25-30%, wide-complex tachycardia status post pacemaker, carotid artery disease, kidney stones, BPH with chronic indwelling Foley, GERD, recently diagnosed seizure disorder who presents with fever, poor oral intake, and progressive weakness over the last 3 days. He was independent up until a month and a half ago when he developed seizure disorder. He was hospitalized and started on antiepileptic medications. Since that time, he has been sleepy and somewhat weak. He was receiving home health services and started using a walker. His thinking has been less clear and he has had some sundowning. His wife and his daughter have been taking care of him. Over the last 3 days, he developed fevers, increased confusion, weakness requiring additional assistance to get out of bed. He refused to eat, drink, or take his medications for the last 2 days. He did not receive his Lasix yesterday or today and he has developed some ankle swelling. His family brought him to the emergency department today because of concern for urinary tract infection and confusion. He was found to have UTI and suggestion of heart failure on CXR, however, his blood pressures were soft. Tx for UTI and repeat CXR in AM to determine if he needs some diuresis now that his BP is getting better. His family is requesting placement at [facility] possible at the time of discharge.  Assessment/Plan  Severe  sepsis (fever, tachycardia, low blood pressure) due to pyelonephritis/CAUTI due to indwelling urinary catheter present at time of admission. Catheter exchanged by ER MD  - Ns boluses as needed to keep SBP > 90  - Lactic acid 1.28  - Hold BP medications  - BCx pending  - UCx pending  - Continue ceftriaxone  Switch to  oral ciprofloxacin in the morning  Metabolic encephalopathy/delirium due likely due UTI, improved this AM and better able to tolerate PO reliably  - tx UTI as above  - Avoid sedating medications  - Frequent reorientation  - Falls precautions  - Resume oral medications   Generalized weakness due to heart failure and acute illness  - PT/OT evaluations pending  - SW consult for SNF placement   Hyponatremia, likely due to poor oral intake + heart failure, improving  - Resume lasix when BP tolerates and eating reliably - continue to hold today  - Repeat BMP in AM   Dysphagia and severe protein calorie malnutrition  - appreciate SLP and nutrition assistance  - Dysphagia 2 with thin liquids  - PPI  - Dietary supplements    CAD s/p CABG, continue ASA & statin  - Hold lasix  for now due to intermittent hypotension   Acute on chronic systolic heart failure due to ischemic cardiomyopathy with effusions on CXR  - continue to hold lasix for now due to hypotension from sepsis  - Hold ACEI, and Coreg, Lasix - Telemetry: Paced, no dangerous arrhythmias, okay to d/c telemetry  - Troponins: Neg  - Repeat CXR in AM    Seizure disorder  - Seizure precautions  - Resume keppra  and dilantin   Diet: Dysphagia 2 with thin  Access: PIV  IVF: off  Proph: lovenox   Code Status: DNR >> filled out DNR and MOST form with family today  Family Communication: patient, his wife, daughter  Disposition Plan: To SNF pending results of urine culture, BPs improved. Family requests liquid meds  where able  Consultation none Procedures:  9/14 CXR: Interstitial edema and small pleural  effusions  Antibiotics:  Ceftriaxone 9/14 >>      HPI/Subjective: Multiple family members in the room Patient is sitting comfortably in bed  Objective: Filed Vitals:   10/23/13 1020 10/23/13 1422 10/23/13 2142 10/24/13 0526  BP: 106/61 121/57 97/56 113/64  Pulse: 70 85 91 81  Temp: 97.2 F (36.2 C) 98.3 F (36.8 C) 97.7 F (36.5 C) 97.8 F (36.6 C)  TempSrc: Oral Oral Oral Oral  Resp: Height:      Weight:    61 kg (134 lb 7.7 oz)  SpO2: 99% 100% 98% 97%    Intake/Output Summary (Last 24 hours) at 10/24/13 1140 Last data filed at 10/24/13 0902  Gross per 24 hour  Intake    720 ml  Output    350 ml  Net    370 ml    Exam:  General: WM, No acute distress  HEENT: NCAT, MMM  Cardiovascular: RRR, nl S1, S2 no mrg, 2+ pulses, warm extremities  Respiratory: faint rales at left base, diminished at bases bilaterally, no rhonchi or wheeze, no increased WOB  Abdomen: NABS, soft, NT/ND  MSK: Normal tone and bulk, 1+ bilateral pitting LEE  Neuro: Grossly intact      Data Reviewed: Basic Metabolic Panel:  Recent Labs Lab 10/22/13 1500 10/23/13 0435  NA 129* 135*  K 4.3 4.0  CL 88* 96  CO2 22 26  GLUCOSE 79 69*  BUN 21 22  CREATININE 1.15 1.17  CALCIUM 9.3 8.5    Liver Function Tests:  Recent Labs Lab 10/22/13 1500  AST 41*  ALT 55*  ALKPHOS 108  BILITOT 0.8  PROT 7.0  ALBUMIN 3.7   No results found for this basename: LIPASE, AMYLASE,  in the last 168 hours No results found for this basename: AMMONIA,  in the last 168 hours  CBC:  Recent Labs Lab 10/22/13 1500 10/23/13 0435  WBC 11.1* 8.7  NEUTROABS 8.0*  --   HGB 13.5 11.4*  HCT 40.0 34.0*  MCV 86.4 86.1  PLT 226 191    Cardiac Enzymes:  Recent Labs Lab 10/22/13 1748 10/22/13 2340  TROPONINI <0.30 <0.30   BNP (last 3 results)  Recent Labs  07/08/13 0945 08/29/13 0649 10/22/13 1339  PROBNP 9136.0* 8114.0* 33814.0*     CBG:  Recent Labs Lab  10/23/13 0652  GLUCAP 90    Recent Results (from the past 240 hour(s))  CULTURE, BLOOD (ROUTINE X 2)     Status: None   Collection Time    10/22/13 12:59 PM      Result Value Ref Range Status   Specimen Description BLOOD LEFT ANTECUBITAL   Final   Special Requests BOTTLES DRAWN AEROBIC AND ANAEROBIC   Final   Culture  Setup Time     Final   Value: 10/22/2013 16:29     Performed at [facility]   Culture     Final   Value:        BLOOD CULTURE RECEIVED NO GROWTH TO DATE CULTURE WILL BE HELD FOR  5 DAYS BEFORE ISSUING A FINAL NEGATIVE REPORT     Performed at [facility]   Report Status PENDING   Incomplete  URINE CULTURE     Status: None   Collection Time    10/22/13  1:34 PM      Result Value Ref Range Status   Specimen Description URINE, CATHETERIZED   Final   Special Requests NONE   Final   Culture  Setup Time     Final   Value: 10/22/2013 23:01     Performed at [facility] Count     Final   Value: >=100,000 COLONIES/ML     Performed at [facility]   Culture     Final   Value: ESCHERICHIA COLI     Performed at [facility]   Report Status PENDING   Incomplete  CULTURE, BLOOD (ROUTINE X 2)     Status: None   Collection Time    10/22/13  1:40 PM      Result Value Ref Range Status   Specimen Description BLOOD RIGHT ANTECUBITAL   Final   Special Requests BOTTLES DRAWN AEROBIC AND ANAEROBIC   Final   Culture  Setup Time     Final   Value: 10/22/2013 16:28     Performed at [facility]   Culture     Final   Value:        BLOOD CULTURE RECEIVED NO GROWTH TO DATE CULTURE WILL BE HELD FOR 5 DAYS BEFORE ISSUING A FINAL NEGATIVE REPORT     Performed at [facility]   Report Status PENDING   Incomplete     Studies: Dg Chest Port 1 View  10/24/2013   CLINICAL DATA:  Evaluate endotracheal tube position  EXAM: PORTABLE CHEST - 1 VIEW  COMPARISON:  Portable chest x-ray of 10/22/2013  FINDINGS: There is  moderate pulmonary vascular congestion present with a probable small left effusion. No endotracheal tube is seen. Dual lead permanent pacemaker remains and cardiomegaly is stable.  IMPRESSION: 1. Slight increase in degree of pulmonary vascular congestion. 2. No endotracheal tube is seen.   Electronically Signed   By: Dwyane Dee M.D.   On: 10/24/2013 08:11   Dg Chest Portable 1 View  10/22/2013   CLINICAL DATA:  Altered mental status, weakness  EXAM: PORTABLE CHEST - 1 VIEW  COMPARISON:  10/03/2013  FINDINGS: Moderate enlargement of the cardiac silhouette is reidentified with evidence of CABG. Left-sided dual lead pacer in place. Small bilateral pleural effusions are identified with interstitial Kerley B-lines and central vascular congestion compatible with early interstitial edema. Calcified pleural plaques are reidentified.  IMPRESSION: Early interstitial edema and small pleural effusions.   Electronically Signed   By: Christiana Pellant M.D.   On: 10/22/2013 13:15    Scheduled Meds: . aspirin EC  81 mg Oral QHS  . cefTRIAXone (ROCEPHIN)  IV  1 g Intravenous Q24H  . dutasteride  0.5 mg Oral Daily  . enoxaparin (LOVENOX) injection  40 mg Subcutaneous Q24H  . lactose free nutrition  237 mL Oral BID BM  . levETIRAcetam  1,000 mg Oral Daily  . levETIRAcetam  1,500 mg Oral QHS  . loratadine  10 mg Oral Daily  . pantoprazole sodium  40 mg Per Tube BID  . phenytoin  100 mg Oral TID  . simvastatin  40 mg Oral q1800  . sodium chloride  3 mL Intravenous Q12H  . tamsulosin  0.8  mg Oral QPC supper   Continuous Infusions:   Active Problems:   Essential hypertension, benign   Carotid artery disease   Hyponatremia   UTI (urinary tract infection)   Protein-calorie malnutrition, severe   Acute on chronic combined systolic and diastolic heart failure    Time spent: 40 minutes   [facility]  [facility] Pager [phone]. If 7PM-7AM, please contact night-coverage at [email or web address], password  [facility] 10/24/2013, 11:40 AM  LOS: 2 days

## 2013-10-25 ENCOUNTER — Other Ambulatory Visit: Payer: Self-pay | Admitting: Neurology

## 2013-10-25 LAB — COMPREHENSIVE METABOLIC PANEL
ALBUMIN: 3.2 g/dL — AB (ref 3.5–5.2)
ALT: 35 U/L (ref 0–53)
AST: 28 U/L (ref 0–37)
Alkaline Phosphatase: 100 U/L (ref 39–117)
Anion gap: 12 (ref 5–15)
BILIRUBIN TOTAL: 0.5 mg/dL (ref 0.3–1.2)
BUN: 14 mg/dL (ref 6–23)
CALCIUM: 8.7 mg/dL (ref 8.4–10.5)
CO2: 28 meq/L (ref 19–32)
Chloride: 95 mEq/L — ABNORMAL LOW (ref 96–112)
Creatinine, Ser: 1.04 mg/dL (ref 0.50–1.35)
GFR calc Af Amer: 71 mL/min — ABNORMAL LOW (ref >90)
GFR calc non Af Amer: 62 mL/min — ABNORMAL LOW (ref >90)
Glucose, Bld: 134 mg/dL — ABNORMAL HIGH (ref 70–99)
Potassium: 3.9 mEq/L (ref 3.7–5.3)
Sodium: 135 mEq/L — ABNORMAL LOW (ref 137–147)
Total Protein: 6.3 g/dL (ref 6.0–8.3)

## 2013-10-25 LAB — URINE CULTURE

## 2013-10-25 MED ORDER — HALOPERIDOL 1 MG PO TABS: 1.0000 mg | ORAL_TABLET | Freq: Four times a day (QID) | ORAL | Status: AC | PRN

## 2013-10-25 MED ORDER — PHENYTOIN 125 MG/5ML PO SUSP: 100.0000 mg | Freq: Three times a day (TID) | ORAL | Status: AC

## 2013-10-25 MED ORDER — PANTOPRAZOLE SODIUM 40 MG PO PACK: 40.0000 mg | PACK | Freq: Two times a day (BID) | ORAL | Status: DC

## 2013-10-25 MED ORDER — DUTASTERIDE 0.5 MG PO CAPS: 0.5000 mg | ORAL_CAPSULE | Freq: Every day | ORAL | Status: DC

## 2013-10-25 MED ORDER — HALOPERIDOL LACTATE 5 MG/ML IJ SOLN
1.0000 mg | Freq: Once | INTRAMUSCULAR | Status: AC
  Administered 2013-10-25: 1 mg via INTRAVENOUS
  Filled 2013-10-25: qty 1

## 2013-10-25 MED ORDER — FUROSEMIDE 40 MG PO TABS: 40.0000 mg | ORAL_TABLET | Freq: Every day | ORAL | Status: AC

## 2013-10-25 MED ORDER — CIPROFLOXACIN HCL 500 MG PO TABS: 500.0000 mg | ORAL_TABLET | Freq: Two times a day (BID) | ORAL | Status: AC

## 2013-10-25 NOTE — Progress Notes (Signed)
Discharged to [facility], p/up by [facility].

## 2013-10-25 NOTE — Progress Notes (Signed)
Called [facility], to give report but was just transferred to another phone, kept on hold for 5 mins.

## 2013-10-25 NOTE — Progress Notes (Signed)
Patient is set to discharge to [facility] today. Patient & family at bedside aware. Discharge packet given to RN, Delorise Shiner. [facility] called for transport.   Clinical Social Work Department CLINICAL SOCIAL WORK PLACEMENT NOTE 10/25/2013  Patient:  Bill Cunningham, Bill Cunningham  Account Number:  [id number] Admit date:  10/22/2013  Clinical Social Worker:  Orpah Greek  Date/time:  10/23/2013 02:04 PM  Clinical Social Work is seeking post-discharge placement for this patient at the following level of care:   SKILLED NURSING   (*CSW will update this form in Epic as items are completed)   10/23/2013  Patient/family provided with [facility] Department of Clinical Social Work's list of facilities offering this level of care within the geographic area requested by the patient (or if unable, by the patient's family).  10/23/2013  Patient/family informed of their freedom to choose among providers that offer the needed level of care, that participate in Medicare, Medicaid or managed care program needed by the patient, have an available bed and are willing to accept the patient.  10/23/2013  Patient/family informed of [facility]' ownership interest in [facility], as well as of the fact that they are under no obligation to receive care at this facility.  PASARR submitted to EDS on 10/23/2013 PASARR number received on 10/23/2013  FL2 transmitted to all facilities in geographic area requested by pt/family on  10/23/2013 FL2 transmitted to all facilities within larger geographic area on   Patient informed that his/her managed care company has contracts with or will negotiate with  certain facilities, including the following:     Patient/family informed of bed offers received:  10/23/2013 Patient chooses bed at [facility] Physician recommends and patient chooses bed at    Patient to be transferred to [facility] on  10/25/2013 Patient to be  transferred to facility by [facility] Patient and family notified of transfer on 10/25/2013 Name of family member notified:  patient's wife & daughter at bedside  The following physician request were entered in Epic:   Additional Comments:   Lincoln Maxin, LCSW [facility] Clinical Social Worker cell #: [phone]

## 2013-10-25 NOTE — Discharge Summary (Signed)
Physician Discharge Summary  Bill Cunningham MRN: [id number] DOB/AGE: [id number] y.o.  PCP: Milagros Evener, MD   Admit date: 10/22/2013 Discharge date: 10/25/2013  Discharge Diagnoses:   Toxic encephalopathy in the setting of UTI   Essential hypertension, benign   Carotid artery disease   Hyponatremia Escherichia coli urinary tract infection   Protein-calorie malnutrition, severe   Acute on chronic combined systolic and diastolic heart failure  Followup recommendations Patient is being discharged to SNF with an indwelling Foley Followup in urology outpatient Followup with PCP in 5-7 days     Medication List    STOP taking these medications       Melatonin 3 MG Caps     omeprazole 20 MG capsule  Commonly known as:  PRILOSEC     phenytoin 300 MG ER capsule  Commonly known as:  DILANTIN      TAKE these medications       aspirin EC 81 MG tablet  Take 81 mg by mouth at bedtime.     AVODART 0.5 MG capsule  Generic drug:  dutasteride  Take 0.5 mg by mouth every evening.     dutasteride 0.5 MG capsule  Commonly known as:  AVODART  Take 1 capsule (0.5 mg total) by mouth daily.     BENEFIBER Powd  Take 1 scoop by mouth daily.     carvedilol 3.125 MG tablet  Commonly known as:  COREG  Take 3.125 mg by mouth 2 (two) times daily with a meal.     docusate sodium 100 MG capsule  Commonly known as:  COLACE  Take 100 mg by mouth at bedtime as needed for mild constipation.     fexofenadine 60 MG tablet  Commonly known as:  ALLEGRA  Take 60 mg by mouth daily.     furosemide 40 MG tablet  Commonly known as:  LASIX  Take 1 tablet (40 mg total) by mouth daily.     haloperidol 1 MG tablet  Commonly known as:  HALDOL  Take 1 tablet (1 mg total) by mouth every 6 (six) hours as needed for agitation.     levETIRAcetam 100 MG/ML solution  Commonly known as:  KEPPRA  Take 1,000-1,500 mg by mouth 2 (two) times daily. 1000 mg every morning and 1500 every  evening     multivitamin tablet  Take 1 tablet by mouth daily.     nitroGLYCERIN 0.4 MG SL tablet  Commonly known as:  NITROSTAT  Place 0.4 mg under the tongue every 5 (five) minutes as needed for chest pain.     pantoprazole sodium 40 mg/20 mL Pack  Commonly known as:  PROTONIX  Place 20 mLs (40 mg total) into feeding tube 2 (two) times daily.     phenytoin 125 MG/5ML suspension  Commonly known as:  DILANTIN  Take 4 mLs (100 mg total) by mouth 3 (three) times daily.     potassium chloride 10 MEQ tablet  Commonly known as:  K-DUR  Take 10 mEq by mouth daily.     simvastatin 40 MG tablet  Commonly known as:  ZOCOR  Take 40 mg by mouth daily.     tamsulosin 0.4 MG Caps capsule  Commonly known as:  FLOMAX  Take 0.8 mg by mouth at bedtime.     Zinc 50 MG Tabs  Take 50 mg by mouth daily after breakfast.        Discharge Condition:   Disposition: 01-Home or Self Care   Consults:  None  Significant Diagnostic Studies: Dg Chest Port 1 View  10/24/2013   CLINICAL DATA:  Evaluate endotracheal tube position  EXAM: PORTABLE CHEST - 1 VIEW  COMPARISON:  Portable chest x-ray of 10/22/2013  FINDINGS: There is moderate pulmonary vascular congestion present with a probable small left effusion. No endotracheal tube is seen. Dual lead permanent pacemaker remains and cardiomegaly is stable.  IMPRESSION: 1. Slight increase in degree of pulmonary vascular congestion. 2. No endotracheal tube is seen.   Electronically Signed   By: Dwyane Dee M.D.   On: 10/24/2013 08:11   Dg Chest Portable 1 View  10/22/2013   CLINICAL DATA:  Altered mental status, weakness  EXAM: PORTABLE CHEST - 1 VIEW  COMPARISON:  10/03/2013  FINDINGS: Moderate enlargement of the cardiac silhouette is reidentified with evidence of CABG. Left-sided dual lead pacer in place. Small bilateral pleural effusions are identified with interstitial Kerley B-lines and central vascular congestion compatible with early interstitial  edema. Calcified pleural plaques are reidentified.  IMPRESSION: Early interstitial edema and small pleural effusions.   Electronically Signed   By: Christiana Pellant M.D.   On: 10/22/2013 13:15      Microbiology: Recent Results (from the past 240 hour(s))  CULTURE, BLOOD (ROUTINE X 2)     Status: None   Collection Time    10/22/13 12:59 PM      Result Value Ref Range Status   Specimen Description BLOOD LEFT ANTECUBITAL   Final   Special Requests BOTTLES DRAWN AEROBIC AND ANAEROBIC   Final   Culture  Setup Time     Final   Value: 10/22/2013 16:29     Performed at [facility]   Culture     Final   Value:        BLOOD CULTURE RECEIVED NO GROWTH TO DATE CULTURE WILL BE HELD FOR 5 DAYS BEFORE ISSUING A FINAL NEGATIVE REPORT     Performed at [facility]   Report Status PENDING   Incomplete  URINE CULTURE     Status: None   Collection Time    10/22/13  1:34 PM      Result Value Ref Range Status   Specimen Description URINE, CATHETERIZED   Final   Special Requests NONE   Final   Culture  Setup Time     Final   Value: 10/22/2013 23:01     Performed at [facility] Count     Final   Value: >=100,000 COLONIES/ML     Performed at [facility]   Culture     Final   Value: ESCHERICHIA COLI     Performed at [facility]   Report Status 10/25/2013 FINAL   Final   Organism ID, Bacteria ESCHERICHIA COLI   Final  CULTURE, BLOOD (ROUTINE X 2)     Status: None   Collection Time    10/22/13  1:40 PM      Result Value Ref Range Status   Specimen Description BLOOD RIGHT ANTECUBITAL   Final   Special Requests BOTTLES DRAWN AEROBIC AND ANAEROBIC   Final   Culture  Setup Time     Final   Value: 10/22/2013 16:28     Performed at [facility]   Culture     Final   Value:        BLOOD CULTURE RECEIVED NO GROWTH TO DATE CULTURE WILL BE HELD FOR 5 DAYS BEFORE ISSUING A FINAL NEGATIVE REPORT  Performed at [facility]    Report Status PENDING   Incomplete     Labs: Results for orders placed during the hospital encounter of 10/22/13 (from the past 48 hour(s))  COMPREHENSIVE METABOLIC PANEL     Status: Abnormal   Collection Time    10/25/13  4:44 AM      Result Value Ref Range   Sodium 135 (*) 137 - 147 mEq/L   Potassium 3.9  3.7 - 5.3 mEq/L   Chloride 95 (*) 96 - 112 mEq/L   CO2 28  19 - 32 mEq/L   Glucose, Bld 134 (*) 70 - 99 mg/dL   BUN 14  6 - 23 mg/dL   Creatinine, Ser 1.04  0.50 - 1.35 mg/dL   Calcium 8.7  8.4 - 10.5 mg/dL   Total Protein 6.3  6.0 - 8.3 g/dL   Albumin 3.2 (*) 3.5 - 5.2 g/dL   AST 28  0 - 37 U/L   ALT 35  0 - 53 U/L   Alkaline Phosphatase 100  39 - 117 U/L   Total Bilirubin 0.5  0.3 - 1.2 mg/dL   GFR calc non Af Amer 62 (*) >90 mL/min   GFR calc Af Amer 71 (*) >90 mL/min   Comment: (NOTE)     The eGFR has been calculated using the CKD EPI equation.     This calculation has not been validated in all clinical situations.     eGFR's persistently <90 mL/min signify possible Chronic Kidney     Disease.   Anion gap 12  5 - 15     Brief Summary  The patient is a 80 y.o. year-old male with history of coronary artery disease status post CABG, aortic valve replacement, ischemic cardiomyopathy with ejection fraction of 25-30%, wide-complex tachycardia status post pacemaker, carotid artery disease, kidney stones, BPH with chronic indwelling Foley, GERD, recently diagnosed seizure disorder who presents with fever, poor oral intake, and progressive weakness over the last 3 days. He was independent up until a month and a half ago when he developed seizure disorder. He was hospitalized and started on antiepileptic medications. Since that time, he has been sleepy and somewhat weak. He was receiving home health services and started using a walker. His thinking has been less clear and he has had some sundowning. His wife and his daughter have been taking care of him. Over the last 3 days, he  developed fevers, increased confusion, weakness requiring additional assistance to get out of bed. He refused to eat, drink, or take his medications for the last 2 days. He did not receive his Lasix yesterday or today and he has developed some ankle swelling. His family brought him to the emergency department today because of concern for urinary tract infection and confusion. He was found to have UTI and suggestion of heart failure on CXR, however, his blood pressures were soft. Tx for UTI and repeat CXR in AM to determine if he needs some diuresis now that his BP is getting better. His family is requesting placement at [facility] possible at the time of discharge.   Assessment/Plan  Severe sepsis (fever, tachycardia, low blood pressure) due to pyelonephritis/CAUTI due to indwelling urinary catheter present at time of admission. Catheter exchanged by ER MD  - Lactic acid 1.28  - BCx no growth so far - UCx shows Escherichia coli which is pansensitive  Started on ceftriaxone in the hospital Switch to oral ciprofloxacin for another 7 days Followup with  urology outpatient if the patient is being discharged with indwelling Foley  Metabolic encephalopathy/delirium due likely due UTI, improved this AM and better able to tolerate PO reliably  - tx UTI as above  - Avoid sedating medications  - Frequent reorientation the family - Falls precautions  Patient has required as needed Haldol for agitation  Generalized weakness due to heart failure and acute illness  - PT/OT evaluations pending  - SW consult for SNF placement   Hyponatremia, likely due to poor oral intake + heart failure, improving  - Resume lasix when BP tolerates and eating reliably -  Repeat BMP weekly    Dysphagia and severe protein calorie malnutrition  - appreciate SLP and nutrition assistance  - Dysphagia 2 with thin liquids  - PPI  - Dietary supplements    CAD s/p CABG, continue ASA & statin  Held Lasix initially but then  resumed now that blood pressures improved  Acute on chronic systolic heart failure due to ischemic cardiomyopathy with effusions on CXR  Resume  lasix , no ACE inhibitor because of increased risk of developing acute renal failure in the setting of a baseline dementia and poor oral intake  Continue Lasix and Coreg  - Telemetry: Paced, no dangerous arrhythmias,  - Troponins: Neg  - Repeat CXR in AM   Seizure disorder  - Seizure precautions  - Resume keppra and dilantin    Diet: Dysphagia 2 with thin    Proph: lovenox  Code Status: DNR      Discharge Exam Blood pressure 107/57, pulse 95, temperature 97.4 F (36.3 C), temperature source Oral, resp. rate 18, height $RemoveBe'5\' 7"'RrRLybWem$  (1.702 m), weight 57.3 kg (126 lb 5.2 oz), SpO2 100.00%.  General: WM, No acute distress  HEENT: NCAT, MMM  Cardiovascular: RRR, nl S1, S2 no mrg, 2+ pulses, warm extremities  Respiratory: faint rales at left base, diminished at bases bilaterally, no rhonchi or wheeze, no increased WOB  Abdomen: NABS, soft, NT/ND  MSK: Normal tone and bulk, 1+ bilateral pitting LEE  Neuro: Grossly intact          Follow-up Information   Follow up with Milagros Evener, MD. Schedule an appointment as soon as possible for a visit in 1 week.   Specialty:  Family Medicine   Contact information:   [place] [id number] [phone]       Signed: Reyne Dumas 10/25/2013, 11:13 AM

## 2013-10-25 NOTE — Progress Notes (Signed)
Pharmacist Heart Failure Core Measure Documentation  Assessment: Bill Cunningham has an EF documented as  15-20% on 09/10/13 by 2D Echo.  Rationale: Heart failure patients with left ventricular systolic dysfunction (LVSD) and an EF < 40% should be prescribed an angiotensin converting enzyme inhibitor (ACEI) or angiotensin receptor blocker (ARB) at discharge unless a contraindication is documented in the medical record.  This patient is not currently on an ACEI or ARB for HF.  This note is being placed in the record in order to provide documentation that a contraindication to the use of these agents is present for this encounter.  ACE Inhibitor or Angiotensin Receptor Blocker is contraindicated (specify all that apply)    ACEI allergy AND ARB allergy   Angioedema   Moderate or severe aortic stenosis   Hyperkalemia   Hypotension   Renal artery stenosis   Worsening renal function, preexisting renal disease or dysfunction   Elie Goody, PharmD, BCPS Pager: [phone] 10/25/2013  8:56 AM

## 2013-10-25 NOTE — Progress Notes (Signed)
Called in report to [facility], report given to [employer]

## 2013-10-25 NOTE — Telephone Encounter (Signed)
Rx request 

## 2013-10-26 NOTE — Telephone Encounter (Signed)
This request was generated by pharmacy in error. Liquid Rx was received & filled.

## 2013-10-26 NOTE — Telephone Encounter (Signed)
We changed his Keppra dose to  in AM,  in PM; and also sent in liquid formulation. Pls confirm, thanks

## 2013-10-27 ENCOUNTER — Encounter: Payer: Self-pay | Admitting: Internal Medicine

## 2013-10-27 ENCOUNTER — Non-Acute Institutional Stay (SKILLED_NURSING_FACILITY): Payer: Medicare Other | Admitting: Internal Medicine

## 2013-10-27 DIAGNOSIS — I5043 Acute on chronic combined systolic (congestive) and diastolic (congestive) heart failure: Secondary | ICD-10-CM

## 2013-10-27 DIAGNOSIS — G92 Toxic encephalopathy: Secondary | ICD-10-CM | POA: Insufficient documentation

## 2013-10-27 DIAGNOSIS — Z953 Presence of xenogenic heart valve: Secondary | ICD-10-CM

## 2013-10-27 DIAGNOSIS — Z950 Presence of cardiac pacemaker: Secondary | ICD-10-CM

## 2013-10-27 DIAGNOSIS — Z952 Presence of prosthetic heart valve: Secondary | ICD-10-CM

## 2013-10-27 DIAGNOSIS — E871 Hypo-osmolality and hyponatremia: Secondary | ICD-10-CM

## 2013-10-27 DIAGNOSIS — G929 Unspecified toxic encephalopathy: Secondary | ICD-10-CM

## 2013-10-27 DIAGNOSIS — N10 Acute tubulo-interstitial nephritis: Secondary | ICD-10-CM

## 2013-10-27 DIAGNOSIS — R569 Unspecified convulsions: Secondary | ICD-10-CM

## 2013-10-27 NOTE — Assessment & Plan Note (Signed)
likely due to poor oral intake + heart failure, improving  - Resume lasix when BP tolerates and eating reliably -  Repeat BMP weekly

## 2013-10-27 NOTE — Assessment & Plan Note (Signed)
due to ischemic cardiomyopathy with effusions on CXR  Resume lasix , no ACE inhibitor because of increased risk of developing acute renal failure in the setting of a baseline dementia and poor oral intake  Continue Lasix and Coreg  - Telemetry: Paced, no dangerous arrhythmias,  - Troponins: Neg

## 2013-10-27 NOTE — Assessment & Plan Note (Signed)
With sepsis due to pyelonephritis/CAUTI due to indwelling urinary catheter present at time of admission. Catheter exchanged by ER MD  - Lactic acid 1.28  - BCx no growth so far  - UCx shows Escherichia coli which is pansensitive  Started on ceftriaxone in the hospital  Switch to oral ciprofloxacin for another 7 days  Followup with urology outpatient if the patient is being discharged with indwelling Foley h sepsis

## 2013-10-27 NOTE — Assessment & Plan Note (Signed)
Resume keppra and dilantin

## 2013-10-27 NOTE — Progress Notes (Signed)
MRN: [id number] Name: Bill Cunningham  Sex: male Age: 80 y.o. DOB: [birth year]  [facility] #: [facility] Facility/Room: 511 Level Of Care: SNF Provider: Merrilee Seashore D Emergency Contacts: Extended Emergency Contact Information Primary Emergency Contact: Myricks,Bessie Address: [address]          [place] Home Phone: [phone] Relation: Spouse Secondary Emergency Contact: Hayes,Marsha Address: [address]          [place], [place] [address] [place] Home Phone: [phone] Relation: Daughter  Code Status:DNR   Allergies: Niaspan and Sulfonamide derivatives  Chief Complaint  Patient presents with  . New Admit To SNF    HPI: Patient is 80 y.o. male who was dc from hosp s/p urosepsis.  Past Medical History  Diagnosis Date  . Hyperlipidemia   . LBBB (left bundle branch block)   . Depression   . COPD (chronic obstructive pulmonary disease)   . Calcium oxalate renal stones   . Dizziness   . GERD (gastroesophageal reflux disease)   . Diverticulosis   . IBS (irritable bowel syndrome)   . Leg cramps     not vascular in origin   . Liver cyst   . Wide-complex tachycardia     post op - on a beta blocker   . Sinus congestion   . Bleeding nose     cauteriszed dr Lazarus Salines  / cauterization repeated 10/11  . Coronary artery disease     Nuclear June 03, 2010, scar anterior wall apex and inferior wall, no definite ischemia,    not gated  . Intolerance of drug     Niaspan  . S/P aortic valve replacement with bioprosthetic valve     echo.[name] April, 2011... good valve function... mild AI  . S/P CABG (coronary artery bypass graft) January, 2007  . LV dysfunction     EF 45% / EF 25-30%, echo, April, 2011 / EF 25-30%, echo, April, 2012, akinesis of the inferior and posterior walls  . Ischemic cardiomyopathy     EF followed carefully  . Carotid artery disease     Doppler, 2011, bilateral 40-59%    /   Doppler, March, 2013,  no change, bilateral 40-59%  . BPH (benign prostatic hyperplasia)     2013  . Cough     January, 2014  . Ejection fraction < 50%   . Kidney stones     Past Surgical History  Procedure Laterality Date  . Cholecystectomy    . Aortic valve replacement  02/2005  . Insert / replace / remove pacemaker      must call rep to interrogate pacemaker [id number], [employer], older model  . Coronary artery bypass graft      x5  -- last one in 07  . Lithotripsy    . Mosaic ultra porcine heart valve      medtronic,  [id number], 03/02/2005, MD clarence Cornelius Moras [phone], device questions [phone]      Medication List       This list is accurate as of: 10/27/13  7:31 PM.  Always use your most recent med list.               aspirin EC 81 MG tablet  Take 81 mg by mouth at bedtime.     AVODART 0.5 MG capsule  Generic drug:  dutasteride  Take 0.5 mg by mouth every evening.     BENEFIBER Powd  Take 1 scoop by mouth daily.  carvedilol 3.125 MG tablet  Commonly known as:  COREG  Take 3.125 mg by mouth 2 (two) times daily with a meal.     ciprofloxacin 500 MG tablet  Commonly known as:  CIPRO  Take 1 tablet (500 mg total) by mouth 2 (two) times daily.     docusate sodium 100 MG capsule  Commonly known as:  COLACE  Take 100 mg by mouth at bedtime as needed for mild constipation.     fexofenadine 60 MG tablet  Commonly known as:  ALLEGRA  Take 60 mg by mouth daily.     furosemide 40 MG tablet  Commonly known as:  LASIX  Take 1 tablet (40 mg total) by mouth daily.     haloperidol 1 MG tablet  Commonly known as:  HALDOL  Take 1 tablet (1 mg total) by mouth every 6 (six) hours as needed for agitation.     levETIRAcetam 100 MG/ML solution  Commonly known as:  KEPPRA  Take 1,000-1,500 mg by mouth 2 (two) times daily. 1000 mg every morning and 1500 every evening     multivitamin tablet  Take 1 tablet by mouth daily.     nitroGLYCERIN 0.4 MG SL tablet  Commonly known as:   NITROSTAT  Place 0.4 mg under the tongue every 5 (five) minutes as needed for chest pain.     pantoprazole sodium 40 mg/20 mL Pack  Commonly known as:  PROTONIX  Place 20 mLs (40 mg total) into feeding tube 2 (two) times daily.     phenytoin 125 MG/5ML suspension  Commonly known as:  DILANTIN  Take 4 mLs (100 mg total) by mouth 3 (three) times daily.     potassium chloride 10 MEQ tablet  Commonly known as:  K-DUR  Take 10 mEq by mouth daily.     simvastatin 40 MG tablet  Commonly known as:  ZOCOR  Take 40 mg by mouth daily.     tamsulosin 0.4 MG Caps capsule  Commonly known as:  FLOMAX  Take 0.8 mg by mouth at bedtime.     Zinc 50 MG Tabs  Take 50 mg by mouth daily after breakfast.        No orders of the defined types were placed in this encounter.    Immunization History  Administered Date(s) Administered  . Influenza Split 11/27/2011    History  Substance Use Topics  . Smoking status: Former Smoker    Quit date: 02/08/1954  . Smokeless tobacco: Never Used  . Alcohol Use: No    Family history is noncontributory    Review of Systems      UTO 2/2 dementia   Filed Vitals:   10/27/13 1435  BP: 126/80  Pulse: 97  Temp: 97.2 F (36.2 C)  Resp: 18    Physical Exam  GENERAL APPEARANCE: Alert, modconversant but way off subject. Appropriately groomed. No acute distress.  SKIN: No diaphoresis rash HEAD: Normocephalic, atraumatic  EYES: Conjunctiva/lids clear. Pupils round, reactive. EOMs intact.  EARS: External exam WNL, canals clear. Hearing grossly normal.  NOSE: No deformity or discharge.  MOUTH/THROAT: Lips w/o lesions  RESPIRATORY: Breathing is even, unlabored. Lung sounds are clear   CARDIOVASCULAR: Heart RRR no murmurs, rubs or gallops. No peripheral edema.   GASTROINTESTINAL: Abdomen is soft, non-tender, not distended w/ normal bowel sounds GENITOURINARY: Bladder non tender, not distended  MUSCULOSKELETAL: No abnormal joints or  musculature NEUROLOGIC: Cranial nerves 2-12 grossly intact. Moves all extremities no tremor. PSYCHIATRIC: dementia, no behavioral  issues  Patient Active Problem List   Diagnosis Date Noted  . Toxic encephalopathy 10/27/2013  . Protein-calorie malnutrition, severe 10/23/2013  . Acute on chronic combined systolic and diastolic heart failure 10/23/2013  . UTI (urinary tract infection) 10/22/2013  . Localization-related (focal) (partial) epilepsy and epileptic syndromes with complex partial seizures, without mention of intractable epilepsy 10/13/2013  . Seizure 09/13/2013  . CKD (chronic kidney disease) stage 3, GFR 30-59 ml/min 07/20/2013  . BPH (benign prostatic hypertrophy) with urinary retention 07/20/2013  . Ejection fraction < 50%   . Chronic systolic CHF (congestive heart failure) 02/17/2012  . Cough   . S/P aortic valve replacement with bioprosthetic valve   . Hyponatremia 11/26/2011  . Dyspnea on exertion 11/26/2011  . BPH (benign prostatic hyperplasia)   . Carotid artery disease   . Coronary artery disease   . Ischemic cardiomyopathy   . Hyperlipidemia   . LBBB (left bundle branch block)   . UNSPECIFIED HEMORRHAGE 12/04/2009  . DIVERTICULOSIS OF COLON 06/05/2009  . COPD 01/19/2009  . GERD 01/19/2009  . AORTIC VALVE REPLACEMENT, HX OF 01/19/2009  . PACEMAKER, PERMANENT 01/19/2009  . CORONARY ARTERY BYPASS GRAFT, HX OF 01/19/2009  . Essential hypertension, benign 11/01/2008    CBC    Component Value Date/Time   WBC 8.7 10/23/2013 0435   RBC 3.95* 10/23/2013 0435   HGB 11.4* 10/23/2013 0435   HCT 34.0* 10/23/2013 0435   PLT 191 10/23/2013 0435   MCV 86.1 10/23/2013 0435   LYMPHSABS 1.8 10/22/2013 1500   MONOABS 1.3* 10/22/2013 1500   EOSABS 0.0 10/22/2013 1500   BASOSABS 0.0 10/22/2013 1500    CMP     Component Value Date/Time   NA 135* 10/25/2013 0444   K 3.9 10/25/2013 0444   CL 95* 10/25/2013 0444   CO2 28 10/25/2013 0444   GLUCOSE 134* 10/25/2013 0444   BUN 14  10/25/2013 0444   CREATININE 1.04 10/25/2013 0444   CALCIUM 8.7 10/25/2013 0444   PROT 6.3 10/25/2013 0444   ALBUMIN 3.2* 10/25/2013 0444   AST 28 10/25/2013 0444   ALT 35 10/25/2013 0444   ALKPHOS 100 10/25/2013 0444   BILITOT 0.5 10/25/2013 0444   GFRNONAA 62* 10/25/2013 0444   GFRAA 71* 10/25/2013 0444    Assessment and Plan  UTI (urinary tract infection) With sepsis due to pyelonephritis/CAUTI due to indwelling urinary catheter present at time of admission. Catheter exchanged by ER MD  - Lactic acid 1.28  - BCx no growth so far  - UCx shows Escherichia coli which is pansensitive  Started on ceftriaxone in the hospital  Switch to oral ciprofloxacin for another 7 days  Followup with urology outpatient if the patient is being discharged with indwelling Foley h sepsis   Toxic encephalopathy 2/2 urospsis;required haldol to control  Hyponatremia likely due to poor oral intake + heart failure, improving  - Resume lasix when BP tolerates and eating reliably -  Repeat BMP weekly      Acute on chronic combined systolic and diastolic heart failure due to ischemic cardiomyopathy with effusions on CXR  Resume lasix , no ACE inhibitor because of increased risk of developing acute renal failure in the setting of a baseline dementia and poor oral intake  Continue Lasix and Coreg  - Telemetry: Paced, no dangerous arrhythmias,  - Troponins: Neg    Seizure Resume keppra and dilantin      Margit Hanks, MD

## 2013-10-27 NOTE — Assessment & Plan Note (Signed)
2/2 urospsis;required haldol to control

## 2013-10-28 LAB — CULTURE, BLOOD (ROUTINE X 2)
Culture: NO GROWTH
Culture: NO GROWTH

## 2013-10-29 ENCOUNTER — Non-Acute Institutional Stay (SKILLED_NURSING_FACILITY): Payer: Medicare Other | Admitting: Internal Medicine

## 2013-10-29 DIAGNOSIS — D72829 Elevated white blood cell count, unspecified: Secondary | ICD-10-CM

## 2013-10-29 MED ORDER — OMEPRAZOLE 2 MG/ML ORAL SUSPENSION: 40.0000 mg | Freq: Two times a day (BID) | ORAL | Status: AC

## 2013-10-29 NOTE — Progress Notes (Signed)
Patient ID: Bill Cunningham, male   DOB: 1921-09-13, 80 y.o.   MRN: [id number]   this is an acute visit.  Level care skilled.  Facility AF.   Chief Complaint   Patient presents with   .   acute visit secondary to leukocytosis    HPI: Patient is 80 y.o. male who was dc from hosp s/p urosepsis--. This was treated with Rocephin and eventually he was discharged on ciprofloxacin which he is completing--routine lab work has shown an elevated white count of 15.1.  He has been afebrile vital signs have been stable I do note occasionally his systolic to be slightly under [id number] this was an issue in the hospital as well-he is on Lasix with a history of CHF again this has been a balancing situation with his lower blood pressures-- \ \ He does not appear to be symptomatic- Past Medical History   Diagnosis  Date   .  Hyperlipidemia    .  LBBB (left bundle branch block)    .  Depression    .  COPD (chronic obstructive pulmonary disease)    .  Calcium oxalate renal stones    .  Dizziness    .  GERD (gastroesophageal reflux disease)    .  Diverticulosis    .  IBS (irritable bowel syndrome)    .  Leg cramps      not vascular in origin   .  Liver cyst    .  Wide-complex tachycardia      post op - on a beta blocker   .  Sinus congestion    .  Bleeding nose      cauteriszed dr Lazarus Salines / cauterization repeated 10/11   .  Coronary artery disease      Nuclear June 03, 2010, scar anterior wall apex and inferior wall, no definite ischemia, not gated   .  Intolerance of drug      Niaspan   .  S/P aortic valve replacement with bioprosthetic valve      echo.[name] April, 2011... good valve function... mild AI   .  S/P CABG (coronary artery bypass graft)  January, 2007   .  LV dysfunction      EF 45% / EF 25-30%, echo, April, 2011 / EF 25-30%, echo, April, 2012, akinesis of the inferior and posterior walls   .  Ischemic cardiomyopathy      EF followed carefully   .  Carotid artery disease     Doppler, 2011, bilateral 40-59% / Doppler, March, 2013, no change, bilateral 40-59%   .  BPH (benign prostatic hyperplasia)      2013   .  Cough      January, 2014   .  Ejection fraction < 50%    .  Kidney stones     Past Surgical History   Procedure  Laterality  Date   .  Cholecystectomy     .  Aortic valve replacement   02/2005   .  Insert / replace / remove pacemaker       must call rep to interrogate pacemaker [phone], [employer], older model   .  Coronary artery bypass graft       x5 -- last one in 07   .  Lithotripsy     .  Mosaic ultra porcine heart valve       medtronic, [id number], 03/02/2005, MD clarence Cornelius Moras [phone], device questions [phone]      Medication List  aspirin EC 81 MG tablet    Take 81 mg by mouth at bedtime.    AVODART 0.5 MG capsule    Generic drug: dutasteride    Take 0.5 mg by mouth every evening.    BENEFIBER Powd    Take 1 scoop by mouth daily.    carvedilol 3.125 MG tablet    Commonly known as: COREG    Take 3.125 mg by mouth 2 (two) times daily with a meal.    ciprofloxacin 500 MG tablet    Commonly known as: CIPRO    Take 1 tablet (500 mg total) by mouth 2 (two) times daily.    docusate sodium 100 MG capsule    Commonly known as: COLACE    Take 100 mg by mouth at bedtime as needed for mild constipation.    fexofenadine 60 MG tablet    Commonly known as: ALLEGRA    Take 60 mg by mouth daily.    furosemide 40 MG tablet    Commonly known as: LASIX    Take 1 tablet (40 mg total) by mouth daily.    haloperidol 1 MG tablet    Commonly known as: HALDOL    Take 1 tablet (1 mg total) by mouth every 6 (six) hours as needed for agitation.    levETIRAcetam 100 MG/ML solution    Commonly known as: KEPPRA    Take 1,000-1,500 mg by mouth 2 (two) times daily. 1000 mg every morning and 1500 every evening    multivitamin tablet    Take 1 tablet by mouth daily.    nitroGLYCERIN 0.4 MG SL tablet    Commonly known  as: NITROSTAT    Place 0.4 mg under the tongue every 5 (five) minutes as needed for chest pain.    pantoprazole sodium 40 mg/20 mL Pack    Commonly known as: PROTONIX    Place 20 mLs (40 mg total) into feeding tube 2 (two) times daily.    phenytoin 125 MG/5ML suspension    Commonly known as: DILANTIN    Take 4 mLs (100 mg total) by mouth 3 (three) times daily.    potassium chloride 10 MEQ tablet    Commonly known as: K-DUR    Take 10 mEq by mouth daily.    simvastatin 40 MG tablet    Commonly known as: ZOCOR    Take 40 mg by mouth daily.    tamsulosin 0.4 MG Caps capsule    Commonly known as: FLOMAX    Take 0.8 mg by mouth at bedtime.    Zinc 50 MG Tabs    Take 50 mg by mouth daily after breakfast.     No orders of the defined types were placed in this encounter.  Immunization History   Administered  Date(s) Administered   .  Influenza Split  11/27/2011    History   Substance Use Topics   .  Smoking status:  Former Smoker     Quit date:  02/08/1954   .  Smokeless tobacco:  Never Used   .  Alcohol Use:  No   Family history is noncontributory  Review of Systems UTO 2/2 dementia --per staff does have significant confusion but no fever chills or overt complaints of dysuria sob or cough has been noted                   Physical Exam Temperature 98.0 pulse 75 respirations 18 blood pressure 98/57-115/77-132/72 most recent  GENERAL APPEARANCE: Alert, modconversant  but confused . Appropriately groomed. No acute distress.  SKIN: No diaphoresis rash --dressing over right elbow HEAD: Normocephalic, atraumatic  EYES: Conjunctiva/lids clear. Pupils round, reactive. EOMs intact.  EARSh,earing grossly normal.  NOSE: No deformity or discharge.  MOUTH/THROAT: Lips w/o lesions  RESPIRATORY: Breathing is even, unlabored. Lung sounds are clear --- there is poor respiratory effort CARDIOVASCULAR: Heart RRR an occasional irregular beat no murmurs, rubs or gallops. No peripheral edema.    GASTROINTESTINAL: Abdomen is soft, non-tender, not distended w/ normal bowel sounds  GENITOURINARY: Bladder non tender, not distended--Foley catheter appears have blood tinged urine patient does have a history of pulling at his catheter  MUSCULOSKELETAL: No abnormal joints or musculature  NEUROLOGIC: Cranial nerves 2-12 grossly intact. Moves all extremities no tremor.  PSYCHIATRIC: dementia, Patient Active Problem List    Diagnosis  Date Noted   .  Toxic encephalopathy  10/27/2013   .  Protein-calorie malnutrition, severe  10/23/2013   .  Acute on chronic combined systolic and diastolic heart failure  10/23/2013   .  UTI (urinary tract infection)  10/22/2013   .  Localization-related (focal) (partial) epilepsy and epileptic syndromes with complex partial seizures, without mention of intractable epilepsy  10/13/2013   .  Seizure  09/13/2013   .  CKD (chronic kidney disease) stage 3, GFR 30-59 ml/min  07/20/2013   .  BPH (benign prostatic hypertrophy) with urinary retention  07/20/2013   .  Ejection fraction < 50%    .  Chronic systolic CHF (congestive heart failure)  02/17/2012   .  Cough    .  S/P aortic valve replacement with bioprosthetic valve    .  Hyponatremia  11/26/2011   .  Dyspnea on exertion  11/26/2011   .  BPH (benign prostatic hyperplasia)    .  Carotid artery disease    .  Coronary artery disease    .  Ischemic cardiomyopathy    .  Hyperlipidemia    .  LBBB (left bundle branch block)    .  UNSPECIFIED HEMORRHAGE  12/04/2009   .  DIVERTICULOSIS OF COLON  06/05/2009   .  COPD  01/19/2009   .  GERD  01/19/2009   .  AORTIC VALVE REPLACEMENT, HX OF  01/19/2009   .  PACEMAKER, PERMANENT  01/19/2009   .  CORONARY ARTERY BYPASS GRAFT, HX OF  01/19/2009   .  Essential hypertension, benign  11/01/2008    labs.  10/29/2013.   WBC 15.1-hemoglobin 12.9-platelets 272.  Sodium 140 potassium 3.9 BUN 22 creatinine 1.1.       Component  Value  Date/Time    WBC  8.7   10/23/2013 0435    RBC  3.95*  10/23/2013 0435    HGB  11.4*  10/23/2013 0435    HCT  34.0*  10/23/2013 0435    PLT  191  10/23/2013 0435    MCV  86.1  10/23/2013 0435    LYMPHSABS  1.8  10/22/2013 1500    MONOABS  1.3*  10/22/2013 1500    EOSABS  0.0  10/22/2013 1500    BASOSABS  0.0  10/22/2013 1500   CMP    Component  Value  Date/Time    NA  135*  10/25/2013 0444    K  3.9  10/25/2013 0444    CL  95*  10/25/2013 0444    CO2  28  10/25/2013 0444    GLUCOSE  134*  10/25/2013 0444  BUN  14  10/25/2013 0444    CREATININE  1.04  10/25/2013 0444    CALCIUM  8.7  10/25/2013 0444    PROT  6.3  10/25/2013 0444    ALBUMIN  3.2*  10/25/2013 0444    AST  28  10/25/2013 0444    ALT  35  10/25/2013 0444    ALKPHOS  100  10/25/2013 0444    BILITOT  0.5  10/25/2013 0444    GFRNONAA  62*  10/25/2013 0444    GFRAA  71*  10/25/2013 0444   Assessment and Plan  UTI (urinary tract infection)  With sepsis due to pyelonephritis/CAUTI due to indwelling urinary catheter present at time of admission. Catheter exchanged by ER MD  - Lactic acid 1.28  - BCx no growth so far  - UCx shows Escherichia coli which is pansensitive  Started on ceftriaxone in the hospital  Switch to oral ciprofloxacin for another 7 days  Followup with urology outpatient if the patient is being discharged with indwelling Foley  White count appears to be trending up although he does not appear to be overtly symptomatic-will order a repeat urine culture-also CBC with differential stat a.m. tomorrow notify provider for results   Toxic encephalopathy  2/2 urospsis;required haldol to control  Hyponatremia  likely due to poor oral intake + heart failure, improving  - appears stable on today's lab  Repeat BMP--next week   Acute on chronic combined systolic and diastolic heart failure  due to ischemic cardiomyopathy with effusions on CXR  Resumed   lasix , no ACE inhibitor because of increased risk of developing acute renal failure in the setting of a  baseline dementia and poor oral intake  Continue Lasix and Coreg--but hold Lasix for systolic blood pressure less than 100  - Telemetry: Paced, no dangerous arrhythmias,  - Troponins: Neg  Seizure  Resumed keppra and dilantin----update Dilantin level  Leukocytosis-unspecified-again obtain CBC with differential tomorrow-also obtain a urinalysis and culture-as well as a chest x-ray-monitor vital signs pulse ox every shift as wel l  [id number]

## 2013-11-01 ENCOUNTER — Ambulatory Visit: Payer: Medicare Other | Admitting: Cardiology

## 2013-11-04 ENCOUNTER — Encounter: Payer: Self-pay | Admitting: Internal Medicine

## 2013-11-04 DIAGNOSIS — D72829 Elevated white blood cell count, unspecified: Secondary | ICD-10-CM | POA: Insufficient documentation

## 2013-11-13 ENCOUNTER — Encounter: Payer: Self-pay | Admitting: *Deleted

## 2013-11-21 ENCOUNTER — Ambulatory Visit: Payer: Medicare Other | Admitting: Cardiology

## 2013-11-26 ENCOUNTER — Telehealth: Payer: Self-pay | Admitting: Neurology

## 2013-11-26 NOTE — Telephone Encounter (Signed)
Mindi JunkerMarsha, Pt's daughter called to inform us that pt passed away on Saturday 02/16/13. Pt's appt for 01/18/14 f/u has been canceled.  C/b [phone]

## 2013-12-09 DEATH — deceased

## 2014-01-08 ENCOUNTER — Ambulatory Visit: Payer: Medicare Other | Admitting: Neurology

## 2014-01-18 ENCOUNTER — Ambulatory Visit: Payer: Medicare Other | Admitting: Neurology

## 2016-07-06 IMAGING — CT CT HEAD W/O CM
1 series · 16 of 30 positions shown, 20 images · non-contrast
Comparison: 09/13/2013

CLINICAL DATA: Left-sided weakness and confusion. Evaluate for
evolving infarct.

EXAM:
CT HEAD WITHOUT CONTRAST
TECHNIQUE: Contiguous axial images were obtained from the base of the skull
through the vertex without intravenous contrast.

[Series 2: head 5.0 h30s · axial · 0.43mm/px · z∈[-178,-38]mm · 16 of 32 slices shown, 20 images]
[im 2/32  brain]
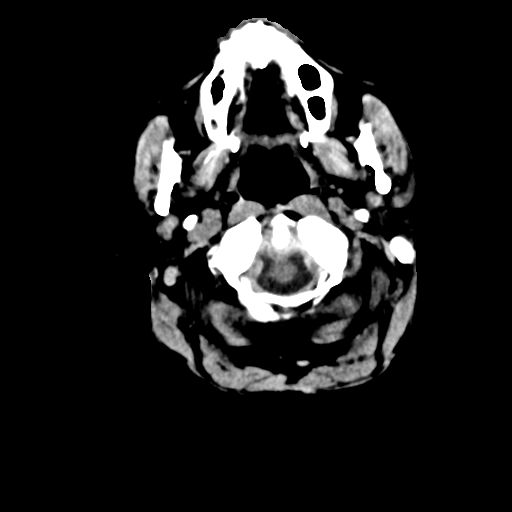
[im 2/32  bone]
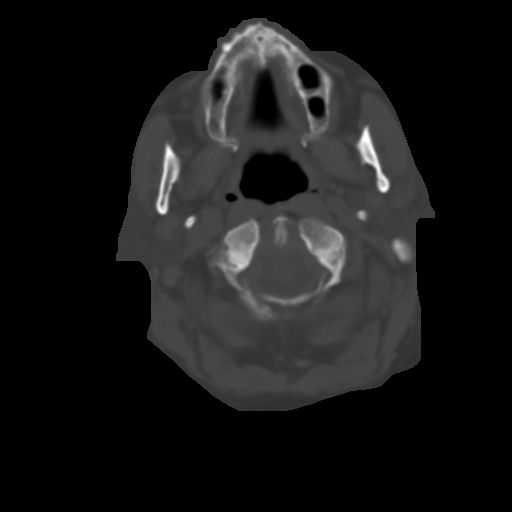
[im 4/32  brain]
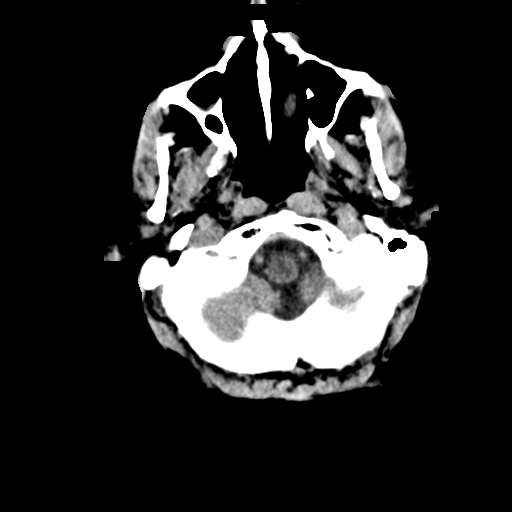
[im 6/32  brain]
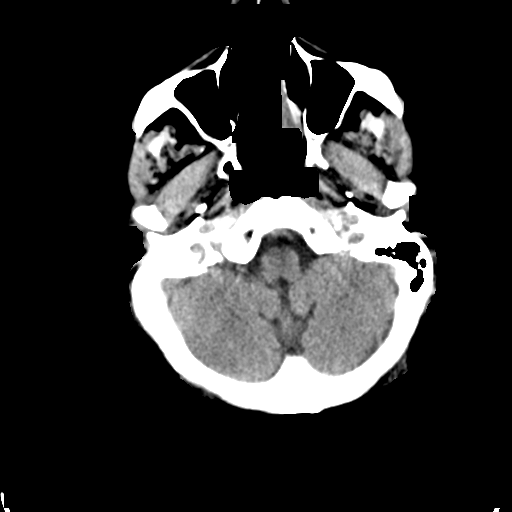
[im 8/32  brain]
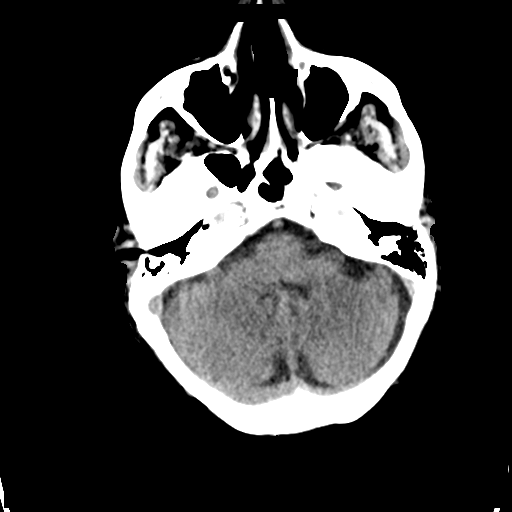
[im 9/32  brain]
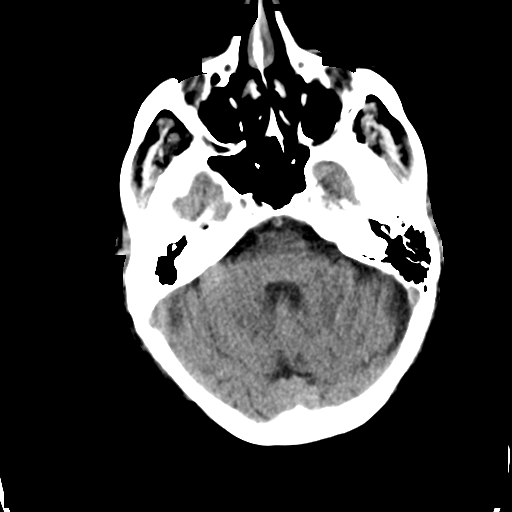
[im 9/32  bone]
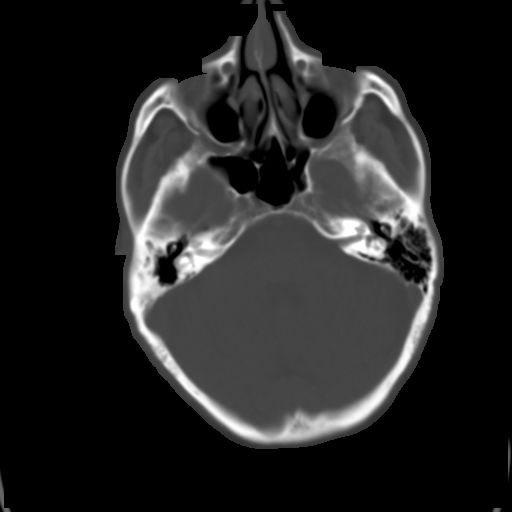
[im 11/32  brain]
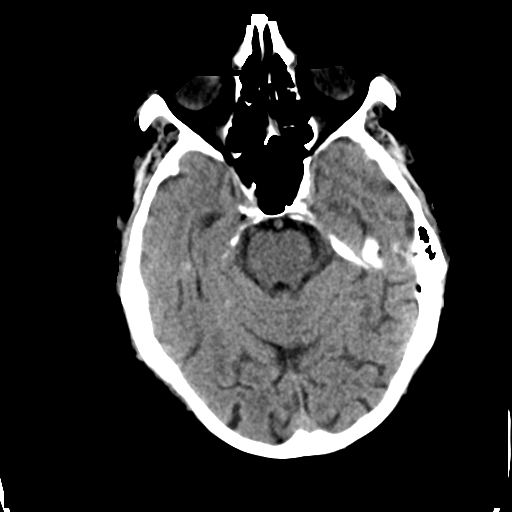
[im 13/32  brain]
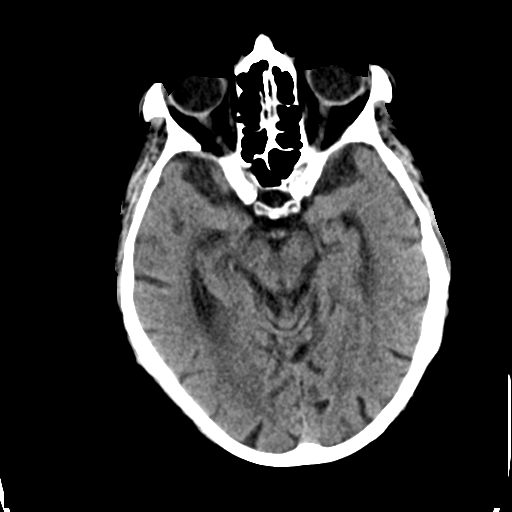
[im 15/32  brain]
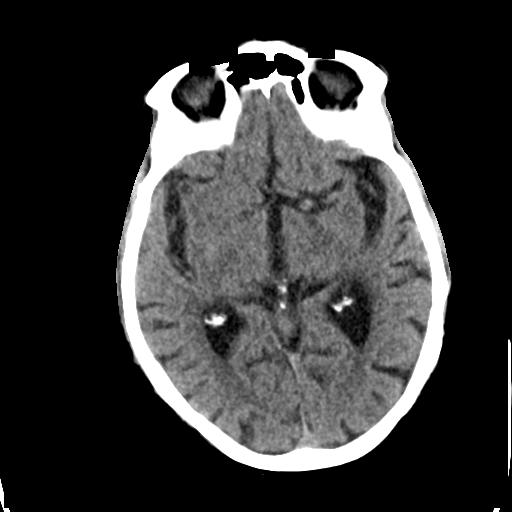
[im 17/32  brain]
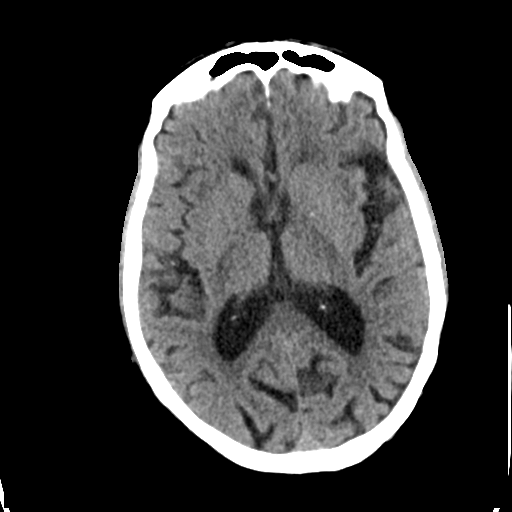
[im 17/32  bone]
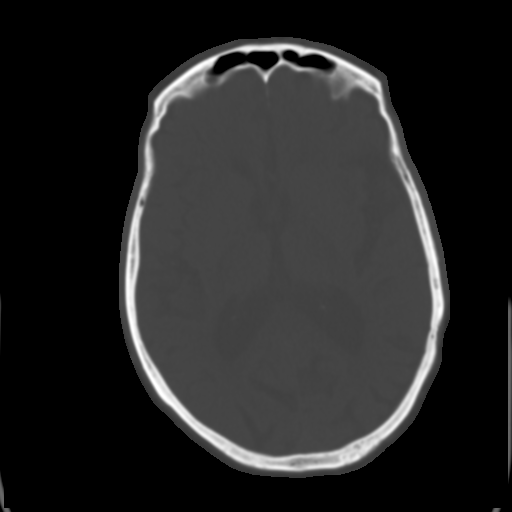
[im 19/32  brain]
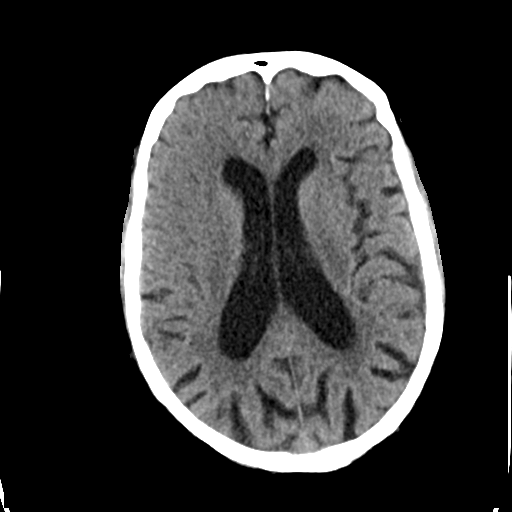
[im 21/32  brain]
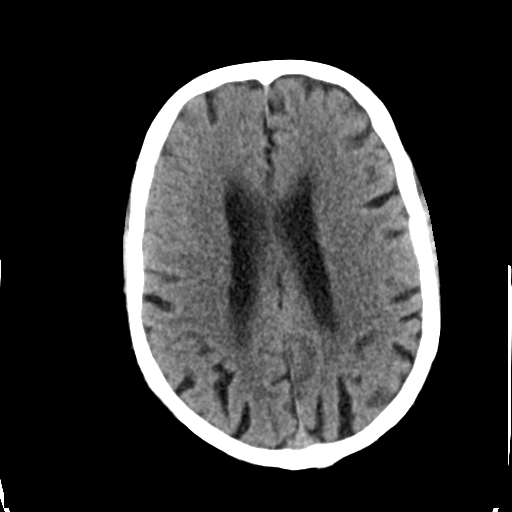
[im 23/32  brain]
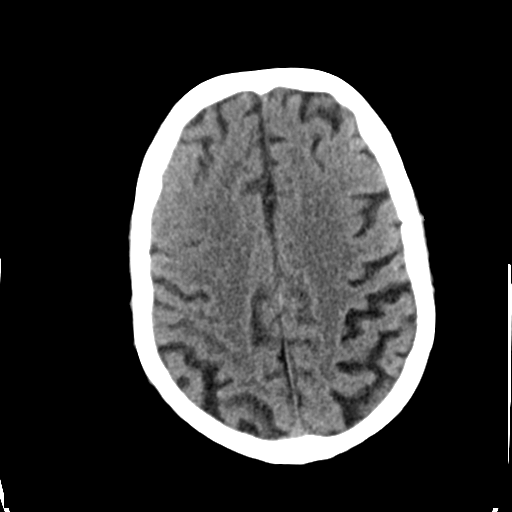
[im 24/32  brain]
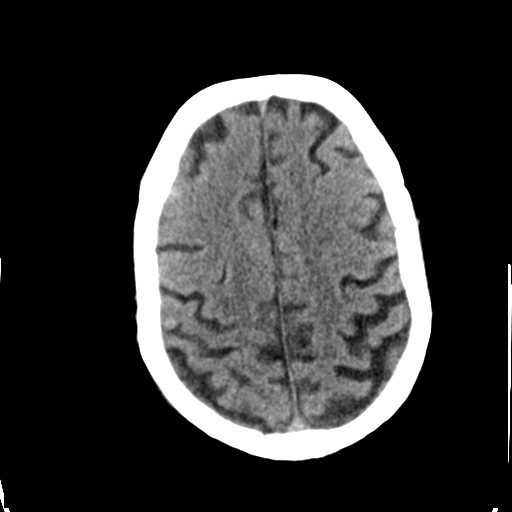
[im 24/32  bone]
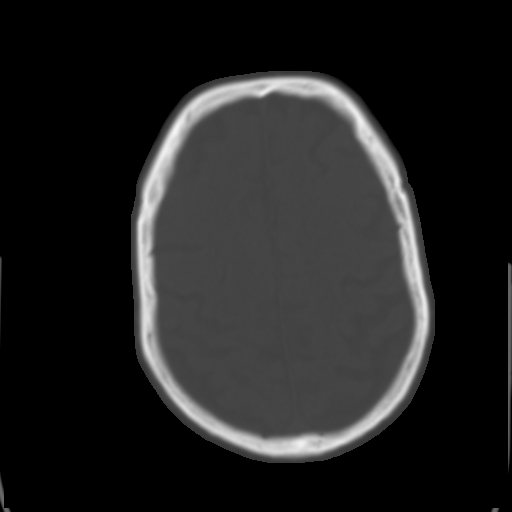
[im 26/32  brain]
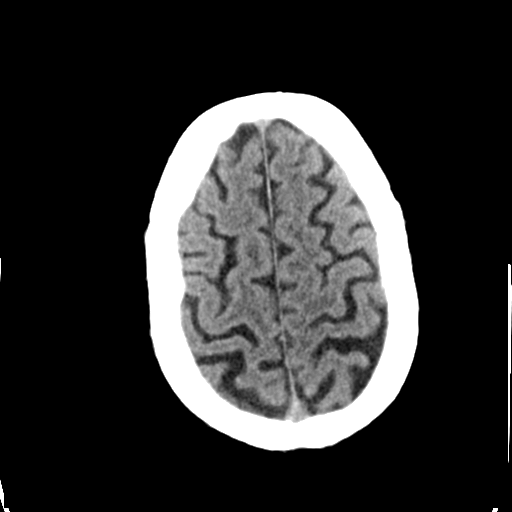
[im 28/32  brain]
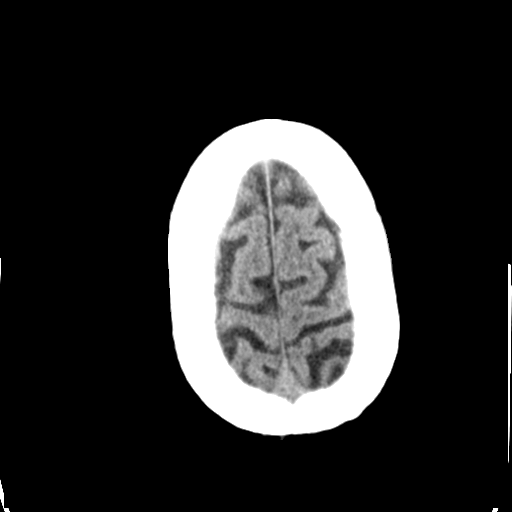
[im 30/32  brain]
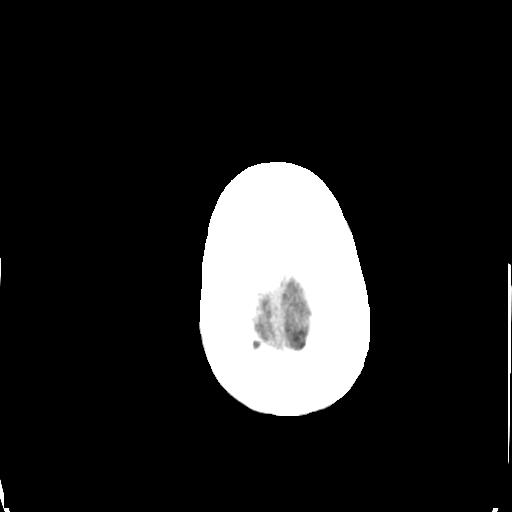

[16 of 30 positions shown; findings below may reference images not displayed]

FINDINGS: There is no evidence of acute cortical infarct, intracranial
hemorrhage, mass, midline shift, or extra-axial fluid collection.
Moderate cerebral atrophy is unchanged. Patchy periventricular white
matter hypodensities are unchanged and nonspecific but compatible
with mild chronic small vessel ischemic disease.

Prior bilateral cataract extraction is noted. Mastoid air cells are
clear. Minimal right greater than left maxillary sinus mucosal
thickening is noted. Moderate carotid siphon calcification is
present.
IMPRESSION: No evidence of acute intracranial abnormality. Mild chronic small
vessel ischemic disease.

## 2016-08-15 IMAGING — CR DG CHEST 1V PORT
1 series · 1 of 1 positions shown · non-contrast
Comparison: Portable chest x-ray of 10/22/2013

CLINICAL DATA: Evaluate endotracheal tube position

EXAM:
PORTABLE CHEST - 1 VIEW

[AP]
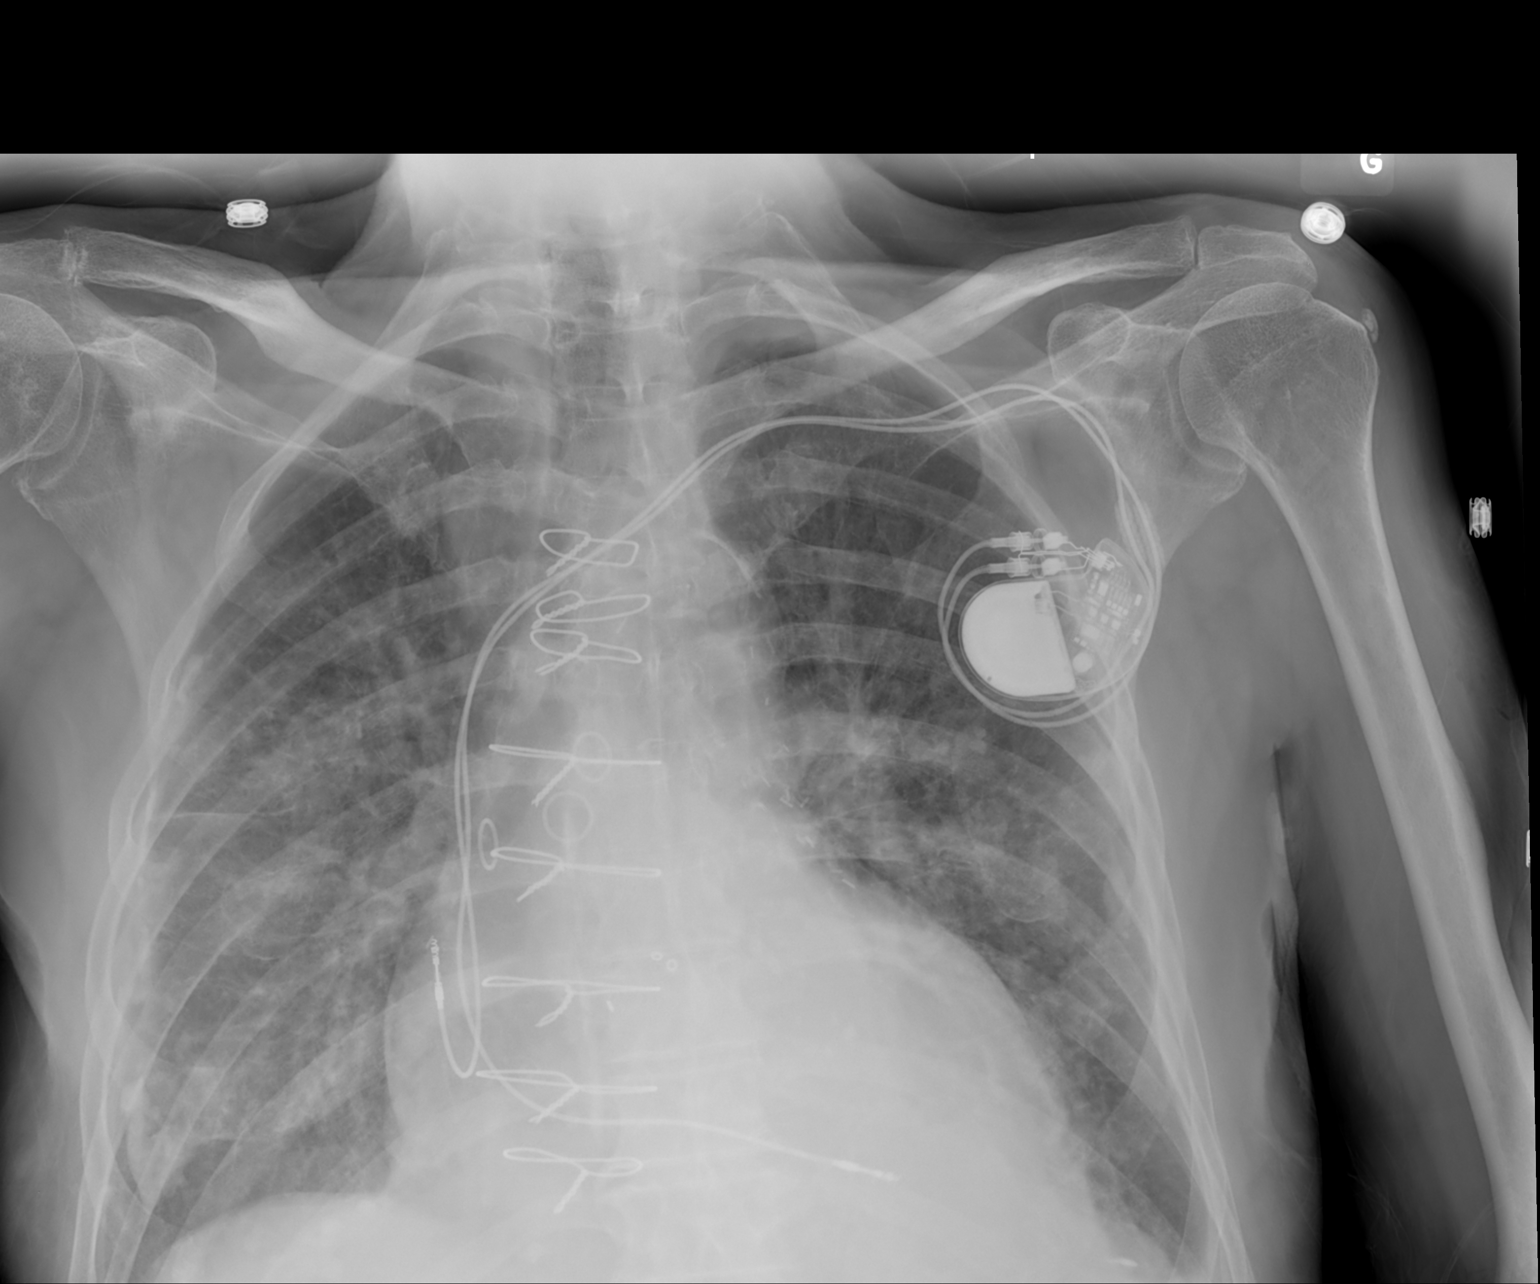

[1 of 1 positions shown; findings below may reference images not displayed]

FINDINGS: There is moderate pulmonary vascular congestion present with a
probable small left effusion. No endotracheal tube is seen. Dual
lead permanent pacemaker remains and cardiomegaly is stable.
IMPRESSION: 1. Slight increase in degree of pulmonary vascular congestion.
2. No endotracheal tube is seen.
